# Patient Record
Sex: Female | Born: 1950
Health system: Southern US, Community
[De-identification: ages and names within clinical notes are randomized; demographics above are authoritative.]

## PROBLEM LIST (undated history)

## (undated) DIAGNOSIS — E1140 Type 2 diabetes mellitus with diabetic neuropathy, unspecified: Secondary | ICD-10-CM

## (undated) DIAGNOSIS — E785 Hyperlipidemia, unspecified: Secondary | ICD-10-CM

## (undated) DIAGNOSIS — S060X9A Concussion with loss of consciousness of unspecified duration, initial encounter: Secondary | ICD-10-CM

## (undated) DIAGNOSIS — E1165 Type 2 diabetes mellitus with hyperglycemia: Secondary | ICD-10-CM

## (undated) DIAGNOSIS — I10 Essential (primary) hypertension: Secondary | ICD-10-CM

## (undated) DIAGNOSIS — M659 Synovitis and tenosynovitis, unspecified: Secondary | ICD-10-CM

## (undated) DIAGNOSIS — R0681 Apnea, not elsewhere classified: Secondary | ICD-10-CM

## (undated) DIAGNOSIS — K5790 Diverticulosis of intestine, part unspecified, without perforation or abscess without bleeding: Secondary | ICD-10-CM

## (undated) DIAGNOSIS — M1990 Unspecified osteoarthritis, unspecified site: Secondary | ICD-10-CM

## (undated) DIAGNOSIS — G4733 Obstructive sleep apnea (adult) (pediatric): Secondary | ICD-10-CM

## (undated) DIAGNOSIS — M5416 Radiculopathy, lumbar region: Secondary | ICD-10-CM

## (undated) DIAGNOSIS — S060XAA Concussion with loss of consciousness status unknown, initial encounter: Secondary | ICD-10-CM

## (undated) HISTORY — DX: Concussion with loss of consciousness of unspecified duration, initial encounter: S06.0X9A

## (undated) HISTORY — DX: Unspecified osteoarthritis, unspecified site: M19.90

## (undated) HISTORY — DX: Obstructive sleep apnea (adult) (pediatric): G47.33

## (undated) HISTORY — DX: Radiculopathy, lumbar region: M54.16

## (undated) HISTORY — DX: Diverticulosis of intestine, part unspecified, without perforation or abscess without bleeding: K57.90

## (undated) HISTORY — DX: Hyperlipidemia, unspecified: E78.5

## (undated) HISTORY — DX: Concussion with loss of consciousness status unknown, initial encounter: S06.0XAA

## (undated) HISTORY — DX: Essential (primary) hypertension: I10

## (undated) HISTORY — DX: Apnea, not elsewhere classified: R06.81

## (undated) HISTORY — DX: Type 2 diabetes mellitus with hyperglycemia: E11.65

## (undated) HISTORY — DX: Type 2 diabetes mellitus with diabetic neuropathy, unspecified: E11.40

## (undated) HISTORY — DX: Synovitis and tenosynovitis, unspecified: M65.9

---

## 1973-04-09 HISTORY — PX: OTHER SURGICAL HISTORY: SHX169

## 1974-04-09 HISTORY — PX: EXPLORATORY LAPAROTOMY: SUR591

## 1994-04-09 DIAGNOSIS — I10 Essential (primary) hypertension: Secondary | ICD-10-CM

## 1994-04-09 HISTORY — DX: Essential (primary) hypertension: I10

## 1998-04-09 DIAGNOSIS — E785 Hyperlipidemia, unspecified: Secondary | ICD-10-CM

## 1998-04-09 HISTORY — DX: Hyperlipidemia, unspecified: E78.5

## 2003-04-10 DIAGNOSIS — E1165 Type 2 diabetes mellitus with hyperglycemia: Secondary | ICD-10-CM

## 2003-04-10 DIAGNOSIS — IMO0002 Reserved for concepts with insufficient information to code with codable children: Secondary | ICD-10-CM

## 2003-04-10 HISTORY — DX: Reserved for concepts with insufficient information to code with codable children: IMO0002

## 2003-04-10 HISTORY — DX: Type 2 diabetes mellitus with hyperglycemia: E11.65

## 2004-04-09 HISTORY — PX: MINOR HEMORRHOIDECTOMY: SHX6238

## 2007-02-06 DIAGNOSIS — M48061 Spinal stenosis, lumbar region without neurogenic claudication: Secondary | ICD-10-CM | POA: Insufficient documentation

## 2009-07-08 DEATH — deceased

## 2011-12-17 DIAGNOSIS — M25579 Pain in unspecified ankle and joints of unspecified foot: Secondary | ICD-10-CM | POA: Insufficient documentation

## 2011-12-17 DIAGNOSIS — M24876 Other specific joint derangements of unspecified foot, not elsewhere classified: Secondary | ICD-10-CM | POA: Insufficient documentation

## 2011-12-17 DIAGNOSIS — M2140 Flat foot [pes planus] (acquired), unspecified foot: Secondary | ICD-10-CM

## 2011-12-17 HISTORY — DX: Flat foot (pes planus) (acquired), unspecified foot: M21.40

## 2012-12-23 DIAGNOSIS — S92309A Fracture of unspecified metatarsal bone(s), unspecified foot, initial encounter for closed fracture: Secondary | ICD-10-CM | POA: Insufficient documentation

## 2013-06-19 DIAGNOSIS — K5790 Diverticulosis of intestine, part unspecified, without perforation or abscess without bleeding: Secondary | ICD-10-CM

## 2013-06-19 HISTORY — DX: Diverticulosis of intestine, part unspecified, without perforation or abscess without bleeding: K57.90

## 2015-04-10 DIAGNOSIS — M659 Synovitis and tenosynovitis, unspecified: Secondary | ICD-10-CM

## 2015-04-10 DIAGNOSIS — M65979 Unspecified synovitis and tenosynovitis, unspecified ankle and foot: Secondary | ICD-10-CM

## 2015-04-10 DIAGNOSIS — M76822 Posterior tibial tendinitis, left leg: Secondary | ICD-10-CM

## 2015-04-10 DIAGNOSIS — E1140 Type 2 diabetes mellitus with diabetic neuropathy, unspecified: Secondary | ICD-10-CM

## 2015-04-10 HISTORY — DX: Posterior tibial tendinitis, left leg: M76.822

## 2015-04-10 HISTORY — DX: Synovitis and tenosynovitis, unspecified: M65.9

## 2015-04-10 HISTORY — DX: Type 2 diabetes mellitus with diabetic neuropathy, unspecified: E11.40

## 2015-04-10 HISTORY — DX: Unspecified synovitis and tenosynovitis, unspecified ankle and foot: M65.979

## 2016-01-12 ENCOUNTER — Encounter: Payer: Self-pay | Admitting: Family Medicine

## 2016-01-12 DIAGNOSIS — E1165 Type 2 diabetes mellitus with hyperglycemia: Secondary | ICD-10-CM | POA: Insufficient documentation

## 2016-01-12 DIAGNOSIS — G4733 Obstructive sleep apnea (adult) (pediatric): Secondary | ICD-10-CM | POA: Insufficient documentation

## 2016-01-12 DIAGNOSIS — E1169 Type 2 diabetes mellitus with other specified complication: Secondary | ICD-10-CM | POA: Insufficient documentation

## 2016-01-12 DIAGNOSIS — I10 Essential (primary) hypertension: Secondary | ICD-10-CM | POA: Insufficient documentation

## 2016-01-12 DIAGNOSIS — E0865 Diabetes mellitus due to underlying condition with hyperglycemia: Secondary | ICD-10-CM | POA: Insufficient documentation

## 2016-01-12 DIAGNOSIS — E1140 Type 2 diabetes mellitus with diabetic neuropathy, unspecified: Secondary | ICD-10-CM | POA: Insufficient documentation

## 2016-01-12 DIAGNOSIS — E785 Hyperlipidemia, unspecified: Secondary | ICD-10-CM | POA: Insufficient documentation

## 2016-01-12 DIAGNOSIS — E119 Type 2 diabetes mellitus without complications: Secondary | ICD-10-CM | POA: Insufficient documentation

## 2016-01-13 ENCOUNTER — Encounter: Payer: Self-pay | Admitting: Family Medicine

## 2016-01-13 ENCOUNTER — Ambulatory Visit (INDEPENDENT_AMBULATORY_CARE_PROVIDER_SITE_OTHER): Payer: BC Managed Care – PPO | Admitting: Family Medicine

## 2016-01-13 VITALS — BP 120/77 | HR 69 | Temp 97.9°F | Resp 20 | Ht 70.0 in | Wt 257.0 lb

## 2016-01-13 DIAGNOSIS — Z7689 Persons encountering health services in other specified circumstances: Secondary | ICD-10-CM

## 2016-01-13 DIAGNOSIS — Z6836 Body mass index (BMI) 36.0-36.9, adult: Secondary | ICD-10-CM | POA: Insufficient documentation

## 2016-01-13 DIAGNOSIS — I10 Essential (primary) hypertension: Secondary | ICD-10-CM

## 2016-01-13 DIAGNOSIS — E785 Hyperlipidemia, unspecified: Secondary | ICD-10-CM | POA: Diagnosis not present

## 2016-01-13 DIAGNOSIS — E1149 Type 2 diabetes mellitus with other diabetic neurological complication: Secondary | ICD-10-CM | POA: Diagnosis not present

## 2016-01-13 DIAGNOSIS — Z23 Encounter for immunization: Secondary | ICD-10-CM | POA: Diagnosis not present

## 2016-01-13 LAB — POCT GLYCOSYLATED HEMOGLOBIN (HGB A1C): HEMOGLOBIN A1C: 7.7

## 2016-01-13 MED ORDER — BLOOD GLUCOSE MONITOR KIT: PACK | 0 refills | Status: DC

## 2016-01-13 MED ORDER — GABAPENTIN 100 MG PO CAPS: 100.0000 mg | ORAL_CAPSULE | Freq: Two times a day (BID) | ORAL | 1 refills | Status: DC

## 2016-01-13 MED ORDER — ROSUVASTATIN CALCIUM 5 MG PO TABS: 5.0000 mg | ORAL_TABLET | ORAL | 3 refills | Status: DC

## 2016-01-13 MED ORDER — AMLODIPINE-VALSARTAN-HCTZ 10-320-25 MG PO TABS: 1.0000 | ORAL_TABLET | Freq: Every day | ORAL | 1 refills | Status: DC

## 2016-01-13 MED ORDER — INSULIN DETEMIR 100 UNIT/ML FLEXPEN: 35.0000 [IU] | PEN_INJECTOR | Freq: Two times a day (BID) | SUBCUTANEOUS | 5 refills | Status: DC

## 2016-01-13 MED ORDER — METFORMIN HCL 1000 MG PO TABS: 1000.0000 mg | ORAL_TABLET | Freq: Two times a day (BID) | ORAL | 1 refills | Status: DC

## 2016-01-13 NOTE — Progress Notes (Signed)
Patient ID: Julie Osborne, female  DOB: 02-Dec-1950, 65 y.o.   MRN: [id number] Patient Care Team    Relationship Specialty Notifications Start End  Ma Hillock, DO PCP - General Family Medicine  01/13/16     Subjective:  Julie Osborne is a 65 y.o.  female present for new patient establishment. All past medical history, surgical history, allergies, family history, immunizations, medications and social history were obtained and entered  in the electronic medical record today. All recent labs, ED visits and hospitalizations within the last year were reviewed.  Diabetes: Currently prescribed metformin 1000 mg BID and Levemir 35 units BID. She does admit to neuropathy and is prescribed gabapentin 100 mg BID. Last a1c 7.9, prior was 9.9. Fasting BG: 110-120.  She is not doing as well with diet and exercise since moving, but hopes to get back on track soon.  Eye exam: 07/2014--> pt encouraged to make appt. and have records sent to [place]. Her ophthalmologist is in [place]. She is aware she needs yearly exams.  Foot exam 07/2014 --> 01/13/2016 PNA series pt state completed, requesting records.  Flu shot: administered today Microalbumin: on ARB  HTN: pt reports compliance with hctz 25 mg, amlodipine 10 mg - valsartan 320 mg daily. She denies chest pain, shortness of breath of lower ext edema. She admits to not watching her diet and exercise as well as of lately and wants to get back into the habit. She is on an ARB and a statin.   Hyperlipidemia: pts last lipid level in 5/2017improved with statin. She has been intolorate of about every statin with flu like symptoms. She is prescribed Rosuvastatin 5 mg and takes "every once in awhile".   Health maintenance:  Colonoscopy: completed 2014, by normal, 10 year follow up.  Mammogram: completed:02/08/2015, birads 1, normal.  Cervical cancer screening: last pap: 3 years , normal. Needs scheduled. Immunizations: tdap unknown, Influenza administered today  (encouraged yearly), PNA series completed per pt, awaiting records.  zostavax completed Infectious disease screening: unknown DEXA: none  There is no immunization history for the selected administration types on file for this patient.   Past Medical History:  Diagnosis Date  . Diabetic neuropathy ([place]) 2017  . Hyperlipidemia 2000  . Hypertension, benign 1996  . Tenosynovitis of ankle   . Uncontrolled diabetes mellitus ([place]) 2005   Allergies  Allergen Reactions  . Statins     Lipitor,simvatatin, pravastatin.welchol cause myalgia   Past Surgical History:  Procedure Laterality Date  . EXPLORATORY LAPAROTOMY  1976   endometriosis  . MINOR HEMORRHOIDECTOMY  2006   Family History  Problem Relation Age of Onset  . COPD Mother   . Diabetes Mellitus II Mother   . Heart failure Mother   . Osteoarthritis Father   . Diabetes Sister   . Diabetes Brother   . Dementia Maternal Grandmother   . Heart disease Maternal Grandmother   . Heart disease Maternal Grandfather   . Stroke Maternal Grandfather   . Heart failure Maternal Aunt    Social History   Social History  . Marital status: Married    Spouse name: N/A  . Number of children: N/A  . Years of education: 79   Occupational History  . Pharmacist, hospital   . retired Marine scientist    Social History Main Topics  . Smoking status: Former Smoker    Types: Cigarettes  . Smokeless tobacco: Never Used  . Alcohol use No  . Drug use: No  . Sexual  activity: Yes    Partners: Male    Birth control/ protection: None     Comment: Married   Other Topics Concern  . Not on file   Social History Narrative   Married to [employer], 2 children.    BS, RN retired now Printmaker.    Drinks caffeine, uses herbal remedies. Daily vitmain use.   Wears her seatbelt, smoke detector in the home.    exercises routinely.    She feels safe in her relationships.      Medication List       Accurate as of 01/13/16 12:27 PM. Always use your most recent med list.            albuterol 108 (90 Base) MCG/ACT inhaler Commonly known as:  PROVENTIL HFA;VENTOLIN HFA Inhale into the lungs every 6 (six) hours as needed for wheezing or shortness of breath.   Amlodipine-Valsartan-HCTZ 10-320-25 MG Tabs Take 1 tablet by mouth daily.   blood glucose meter kit and supplies Kit Dispense based on patient and insurance preference. Blood glucose x four times daily as directed. DDX:  ICD-9 250.00, 250.01.   docusate sodium 100 MG capsule Commonly known as:  COLACE Take 100 mg by mouth 2 (two) times daily.   FIBER-CAPS PO Take by mouth.   gabapentin 100 MG capsule Commonly known as:  NEURONTIN Take 1 capsule (100 mg total) by mouth 2 (two) times daily.   Insulin Detemir 100 UNIT/ML Pen Commonly known as:  LEVEMIR FLEXPEN Inject 35 Units into the skin 2 (two) times daily.   metFORMIN 1000 MG tablet Commonly known as:  GLUCOPHAGE Take 1 tablet (1,000 mg total) by mouth 2 (two) times daily with a meal.   montelukast 10 MG tablet Commonly known as:  SINGULAIR Take 10 mg by mouth at bedtime.   multivitamin capsule Take 1 capsule by mouth daily.   OSTEO BI-FLEX JOINT SHIELD PO Take 1 capsule by mouth daily.   rosuvastatin 5 MG tablet Commonly known as:  CRESTOR Take 1 tablet (5 mg total) by mouth every other day.   tiZANidine 4 MG tablet Commonly known as:  ZANAFLEX Take 4 mg by mouth every 6 (six) hours as needed for muscle spasms.        No results found for this or any previous visit (from the past 2160 hour(s)).  Patient was never admitted.   ROS: 14 pt review of systems performed and negative (unless mentioned in an HPI)  Objective: BP 120/77 (BP Location: Right Arm, Patient Position: Sitting, Cuff Size: Large)   Pulse 69   Temp 97.9 F (36.6 C)   Resp 20   Ht _0  (1.778 m)   Wt 257 lb (116.6 kg)   SpO2 96%   BMI 36.88 kg/m  Gen: Afebrile. No acute distress. Nontoxic in appearance, well-developed, well-nourished,  Obese,  caucasian female. Very pleasant.  HENT: AT. [place]. MMM, no oral lesions Eyes:Pupils Equal Round Reactive to light, Extraocular movements intact,  Conjunctiva without redness, discharge or icterus. Neck/lymp/endocrine: Supple,no lymphadenopathy, no thyromegaly CV: RRR no murmur, no edema, diminished but equal pulses bilaterally.  Chest: CTAB, no wheeze, rhonchi or crackles. normal Respiratory effort. good Air movement. Abd: Soft. Obese . NTND. BS present. no Masses palpated. No hepatosplenomegaly. No rebound tenderness or guarding. Skin: no rashes, purpura or petechiae. Warm and well-perfused. Skin intact. Right ankle with compression sleeve in place. Varicosities right LE Neuro/Msk: Normal gait. PERLA. EOMi. Alert. Oriented x3.   Psych: Normal affect, dress and demeanor.  Normal speech. Normal thought content and judgment. Diabetic Foot Exam - Simple   Simple Foot Form Diabetic Foot exam was performed with the following findings:  Yes 01/13/2016 10:40 AM  Visual Inspection No deformities, no ulcerations, no other skin breakdown bilaterally:  Yes Sensation Testing See comments:  Yes Pulse Check See comments:  Yes Comments diminished but equal bilateral PT. varicosities bilateral LE. neuropathy present.      Assessment/plan: Jezlyn Westerfield is a 65 y.o. female present for establishment of care in need of medication refills for chronic medical conditions.  Other diabetic neurological complication associated with type 2 diabetes mellitus (HCC)/ BMI 36.0-36.9,adult Eye exam: 07/2014--> pt encouraged to make appt. and have records sent to [place]. Her ophthalmologist is in [place]. She is aware she needs yearly exams.  Foot exam 07/2014 --> 01/13/2016 PNA series pt state completed, requesting records.  Flu shot: administered today Microalbumin: on ARB - Continue current regimen, a1c today 7.7. Discussed diet and exercise modifications.  - Continue levemir 35 u BID. Pt was cutting back on dose on  occassions. Discuused goal of 100 fasting glucose. She may increase by 1 u daily until seeing 100 BG, as long as not having hypoglycemia. Insulin Detemir (LEVEMIR FLEXPEN) 100 UNIT/ML Pen; Inject 35 Units into the skin 2 (two) times daily.  Dispense: 15 mL; Refill: 5 - gabapentin (NEURONTIN) 100 MG capsule; Take 1 capsule (100 mg total) by mouth 2 (two) times daily.  Dispense: 180 capsule; Refill: 1 - metFORMIN (GLUCOPHAGE) 1000 MG tablet; Take 1 tablet (1,000 mg total) by mouth 2 (two) times daily with a meal.  Dispense: 180 tablet; Refill: 1 - POCT glycosylated hemoglobin (Hb A1C) - blood glucose meter kit and supplies KIT; Dispense based on patient and insurance preference. Blood glucose x four times daily as directed. DDX:  ICD-9 250.00, 250.01.  Dispense: 1 each; Refill: 0  Hypertension, benign - stable, refills provided today - Amlodipine-Valsartan-HCTZ 10-320-25 MG TABS; Take 1 tablet by mouth daily.  Dispense: 180 tablet; Refill: 1 - low salt diet.  - F/U q6 mos  Hyperlipidemia, unspecified hyperlipidemia type - discussed with pt with her medical history and FHX she should try to take a statin for CV protection. If she can tolerate, QOD would at least give some benefits.  - rosuvastatin (CRESTOR) 5 MG tablet; Take 1 tablet (5 mg total) by mouth every other day.  Dispense: 45 tablet; Refill: 3  Need for prophylactic vaccination and inoculation against influenza - Flu Vaccine QUAD 36+ mos PF IM (Fluarix & Fluzone Quad PF)   Return in about 3 months (around 04/14/2016), or diabetes/HTN. with a1c before rooming  Greater than 45 minutes was spent with patient, greater than 50% of that time was spent face-to-face with patient counseling and coordinating care.  Electronically signed by: [name], DO [place]

## 2016-01-13 NOTE — Patient Instructions (Signed)
Pleasure to meet you.  I have refilled your medications.  Follow up in 3 months.  Check fasting blood glucose sugars, we will call in a monitor for you.  Take levemir as directed, if fasting sugars remain higher than 100 add 1 unit daily until you reach ~100. Diet and exercise of course will help.  I will need records for your other conditions in order to evaluate you and send referrals.  Make sure to make an eye appt.

## 2016-01-23 ENCOUNTER — Other Ambulatory Visit: Payer: Self-pay | Admitting: *Deleted

## 2016-01-23 MED ORDER — GLUCOSE BLOOD VI STRP: ORAL_STRIP | 12 refills | Status: DC

## 2016-01-23 MED ORDER — ONETOUCH DELICA LANCETS 33G MISC: 1.0000 | Freq: Three times a day (TID) | 11 refills | Status: DC

## 2016-01-23 NOTE — Telephone Encounter (Signed)
Glucose strips and lancets refilled

## 2016-01-24 ENCOUNTER — Ambulatory Visit (INDEPENDENT_AMBULATORY_CARE_PROVIDER_SITE_OTHER): Payer: BC Managed Care – PPO | Admitting: Family Medicine

## 2016-01-24 ENCOUNTER — Encounter: Payer: Self-pay | Admitting: Family Medicine

## 2016-01-24 VITALS — BP 134/84 | HR 81 | Temp 97.9°F | Resp 20 | Wt 259.5 lb

## 2016-01-24 DIAGNOSIS — R300 Dysuria: Secondary | ICD-10-CM

## 2016-01-24 LAB — POC URINALSYSI DIPSTICK (AUTOMATED)
BILIRUBIN UA: NEGATIVE
GLUCOSE UA: NEGATIVE
Ketones, UA: NEGATIVE
Nitrite, UA: POSITIVE
Protein, UA: NEGATIVE
RBC UA: NEGATIVE
SPEC GRAV UA: 1.015
Urobilinogen, UA: 0.2
pH, UA: 7

## 2016-01-24 MED ORDER — CEPHALEXIN 500 MG PO CAPS: 500.0000 mg | ORAL_CAPSULE | Freq: Three times a day (TID) | ORAL | 0 refills | Status: DC

## 2016-01-24 NOTE — Progress Notes (Signed)
Julie Osborne , 05-22-1950, 65 y.o., female MRN: [id number] Patient Care Team    Relationship Specialty Notifications Start End  Ma Hillock, DO PCP - General Family Medicine  01/13/16     CC: dysuria Subjective: Pt presents for an acute OV with complaints of dysuria of 4 days duration.  Associated symptoms include urinary urgency and  bladder spasms. Pt denies fever, chills, low back pain, abd pain. She is eating and drinking well.  Pt has tried AZO to ease their symptoms. She states she had a UTI about a year ago with similar symptoms.   Allergies  Allergen Reactions  . Statins     Lipitor,simvatatin, pravastatin.welchol cause myalgia   Social History  Substance Use Topics  . Smoking status: Former Smoker    Types: Cigarettes  . Smokeless tobacco: Never Used  . Alcohol use No   Past Medical History:  Diagnosis Date  . Degenerative arthritis   . Diabetic neuropathy ([place]) 2017  . Hyperlipidemia 2000  . Hypertension, benign 1996  . Tenosynovitis of ankle 2017   post tibial tendon dysfunction stage 5 (PTTD)  . Uncontrolled diabetes mellitus ([place]) 2005   Past Surgical History:  Procedure Laterality Date  . EXPLORATORY LAPAROTOMY  1976   endometriosis  . MINOR HEMORRHOIDECTOMY  2006  . OTHER SURGICAL HISTORY Right 1975   venous ligation   Family History  Problem Relation Age of Onset  . COPD Mother   . Diabetes Mellitus II Mother   . Heart failure Mother   . Osteoarthritis Father   . Lung cancer Father 39  . Diabetes Sister   . Diabetes Brother   . Dementia Maternal Grandmother   . Heart disease Maternal Grandmother   . Heart disease Maternal Grandfather   . Stroke Maternal Grandfather   . Heart failure Maternal Aunt   . Lung cancer Paternal Grandfather   . Lung cancer Paternal Aunt      Medication List       Accurate as of 01/24/16  4:35 PM. Always use your most recent med list.          albuterol 108 (90 Base) MCG/ACT inhaler Commonly known as:   PROVENTIL HFA;VENTOLIN HFA Inhale into the lungs every 6 (six) hours as needed for wheezing or shortness of breath.   Amlodipine-Valsartan-HCTZ 10-320-25 MG Tabs Take 1 tablet by mouth daily.   blood glucose meter kit and supplies Kit Dispense based on patient and insurance preference. Blood glucose x four times daily as directed. DDX:  ICD-9 250.00, 250.01.   docusate sodium 100 MG capsule Commonly known as:  COLACE Take 100 mg by mouth 2 (two) times daily.   FIBER-CAPS PO Take by mouth.   gabapentin 100 MG capsule Commonly known as:  NEURONTIN Take 1 capsule (100 mg total) by mouth 2 (two) times daily.   glucose blood test strip Commonly known as:  ONETOUCH VERIO Test blood sugar before meals and at bedtime   Insulin Detemir 100 UNIT/ML Pen Commonly known as:  LEVEMIR FLEXPEN Inject 35 Units into the skin 2 (two) times daily.   metFORMIN 1000 MG tablet Commonly known as:  GLUCOPHAGE Take 1 tablet (1,000 mg total) by mouth 2 (two) times daily with a meal.   montelukast 10 MG tablet Commonly known as:  SINGULAIR Take 10 mg by mouth at bedtime.   multivitamin capsule Take 1 capsule by mouth daily.   ONETOUCH DELICA LANCETS 14E Misc 1 applicator by Does not apply route 4 (four)  times daily -  before meals and at bedtime.   OSTEO BI-FLEX JOINT SHIELD PO Take 1 capsule by mouth daily.   rosuvastatin 5 MG tablet Commonly known as:  CRESTOR Take 1 tablet (5 mg total) by mouth every other day.   tiZANidine 4 MG tablet Commonly known as:  ZANAFLEX Take 4 mg by mouth every 6 (six) hours as needed for muscle spasms.       Results for orders placed or performed in visit on 01/24/16 (from the past 24 hour(s))  POCT Urinalysis Dipstick (Automated)     Status: Abnormal   Collection Time: 01/24/16  4:33 PM  Result Value Ref Range   Color, UA yellow    Clarity, UA clear    Glucose, UA negative    Bilirubin, UA negative    Ketones, UA negative    Spec Grav, UA 1.015      Blood, UA negative    pH, UA 7.0    Protein, UA negative    Urobilinogen, UA 0.2    Nitrite, UA positive    Leukocytes, UA Trace (A) Negative   No results found.   ROS: Negative, with the exception of above mentioned in HPI  Objective:  BP 134/84 (BP Location: Right Arm, Patient Position: Sitting, Cuff Size: Large)   Pulse 81   Temp 97.9 F (36.6 C)   Resp 20   Wt 259 lb 8 oz (117.7 kg)   SpO2 97%   BMI 37.23 kg/m  Body mass index is 37.23 kg/m. Gen: Afebrile. No acute distress. Nontoxic in appearance, well developed, well nourished.  HENT: AT. [place].  MMM Eyes:Pupils Equal Round Reactive to light, Extraocular movements intact,  Conjunctiva without redness, discharge or icterus. CV: RRR  Abd: Soft. obese. NTND. BS present.  MSK: no cva tenderness.  Neuro:  Normal gait. PERLA. EOMi. Alert. Oriented x3   Assessment/Plan: Julie Osborne is a 65 y.o. female present for acute OV for  - Pt taking AZO, will send for urine culture.  - keflex, 500 mg TID  - continue AZO for just 2-3 more days if needed.  - F/U PRN > 25 minutes spent with patient, >50% of time spent face to face counseling patient and coordinating care.   electronically signed by:  [name], DO  [place]

## 2016-01-24 NOTE — Patient Instructions (Signed)
Start keflex every 8 hours for 7 days.  Plenty of rest and hydration. If you develop fever or chills, unable to tolerate medicine, please be seen immediately.  We will call you with the culture results once available.

## 2016-01-25 LAB — URINE CULTURE

## 2016-01-26 ENCOUNTER — Telehealth: Payer: Self-pay | Admitting: Family Medicine

## 2016-01-26 NOTE — Telephone Encounter (Signed)
Please call pt: - her urine culture did not show signs of a predominant bacteria If her symptoms are resolving she can continue the kelfex until completion. If she is still symptomatic we will need to consider other causes of her discomfort.

## 2016-01-26 NOTE — Telephone Encounter (Signed)
Left detailed message with results and instructions on patient voice mail per DPR 

## 2016-01-31 ENCOUNTER — Other Ambulatory Visit: Payer: Self-pay | Admitting: *Deleted

## 2016-01-31 MED ORDER — ONETOUCH DELICA LANCETS 33G MISC: 1.0000 | Freq: Three times a day (TID) | 3 refills | Status: DC

## 2016-01-31 MED ORDER — GLUCOSE BLOOD VI STRP: ORAL_STRIP | 3 refills | Status: DC

## 2016-02-01 ENCOUNTER — Telehealth: Payer: Self-pay | Admitting: Family Medicine

## 2016-02-01 ENCOUNTER — Encounter: Payer: Self-pay | Admitting: Family Medicine

## 2016-02-01 DIAGNOSIS — R809 Proteinuria, unspecified: Secondary | ICD-10-CM | POA: Insufficient documentation

## 2016-02-01 NOTE — Telephone Encounter (Signed)
Left a detailed message on patient voice mail she is to call back with information on requested referral. Will place referral after confirmation from patient.

## 2016-02-01 NOTE — Telephone Encounter (Signed)
Please call pt: - She had asked for a podiatry/ortho referral for her ankle on her new pt establishment visit and I had suggested we wait for prior records before referring her since this was a prior treated condition.  I have received some records, but nothing detailed on that particular condition. I have a copy of an MRI 2015 of her lumbar spine and EMG study 2015 of her legs.  - If she could reach out to her prior specialist for records we may get more detailed information on her management. If she feels she would rather have the referral for ankle ankle now, I can place that referral for her to either podiatry or orthopedics or can wait on records, her decision on how/when to proceed and with which one.  Please advise in which specialist she would rather or if she wants to wait and try to get more records.

## 2016-02-02 NOTE — Telephone Encounter (Signed)
Patient called back and left message stating she will get records required, She states she would like to wait until January before getting referral due to insurance change. She will decide between ortho and podiatry once you have a chance to review records and make recommendation. Dr Raoul Pitch aware.

## 2016-03-22 ENCOUNTER — Telehealth: Payer: Self-pay | Admitting: Family Medicine

## 2016-03-22 NOTE — Telephone Encounter (Signed)
Patient calling to request refill of Insulin Detemir (LEVEMIR FLEXPEN) 100 UNIT/ML Pen.  States she was infomed by pcp to adjust medication according to blood sugar readings.  Pharmacy:   [id number] [address], [place] (Phone) [phone] (Fax)

## 2016-03-22 NOTE — Telephone Encounter (Signed)
Called patient pharmacy they state patient can get refill in the am left message on patient voice mail.

## 2016-03-26 ENCOUNTER — Telehealth: Payer: Self-pay | Admitting: Family Medicine

## 2016-03-26 ENCOUNTER — Ambulatory Visit (INDEPENDENT_AMBULATORY_CARE_PROVIDER_SITE_OTHER): Payer: BC Managed Care – PPO | Admitting: Family Medicine

## 2016-03-26 ENCOUNTER — Encounter: Payer: Self-pay | Admitting: Family Medicine

## 2016-03-26 VITALS — BP 124/78 | HR 85 | Temp 98.1°F | Resp 20 | Wt 258.5 lb

## 2016-03-26 DIAGNOSIS — J0110 Acute frontal sinusitis, unspecified: Secondary | ICD-10-CM

## 2016-03-26 MED ORDER — AMOXICILLIN-POT CLAVULANATE 875-125 MG PO TABS: 1.0000 | ORAL_TABLET | Freq: Two times a day (BID) | ORAL | 0 refills | Status: DC

## 2016-03-26 NOTE — Telephone Encounter (Signed)
Dr. Kuneff pt.  

## 2016-03-26 NOTE — Progress Notes (Signed)
Julie Osborne , 10-Sep-1950, 65 y.o., female MRN: [id number] Patient Care Team    Relationship Specialty Notifications Start End  Ma Hillock, DO PCP - General Family Medicine  01/13/16     CC: congestion Subjective: Pt presents for an acute OV with complaints of congestion of 4 days duration.  Associated symptoms include lightheadedness, low grade fever, headache, nausea, scratchy throat and bilateral ear pressure. She denies vomit, abd pain or diarrhea. 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 She is traveling to [place] in 3 days for her xmas vacation.    Allergies  Allergen Reactions  . Statins     Lipitor,simvatatin, pravastatin.welchol cause myalgia   Social History  Substance Use Topics  . Smoking status: Former Smoker    Types: Cigarettes  . Smokeless tobacco: Never Used  . Alcohol use No   Past Medical History:  Diagnosis Date  . Apnea   . Degenerative arthritis   . Diabetic neuropathy ([place]) 2017  . Diverticulosis 06/19/2013   sigmoid colon on colonoscopy  . Hyperlipidemia 2000  . Hypertension, benign 1996  . Lumbar radiculopathy    Severe L4/L5 radiculopathy by old records.   . Tenosynovitis of ankle 2017   post tibial tendon dysfunction stage 5 (PTTD)  . Uncontrolled diabetes mellitus ([place]) 2005   Past Surgical History:  Procedure Laterality Date  . EXPLORATORY LAPAROTOMY  1976   endometriosis  . MINOR HEMORRHOIDECTOMY  2006  . OTHER SURGICAL HISTORY Right 1975   venous ligation   Family History  Problem Relation Age of Onset  . COPD Mother   . Diabetes Mellitus II Mother   . Heart failure Mother   . Osteoarthritis Father   . Lung cancer Father 47  . Diabetes Sister   . Diabetes Brother   . Dementia Maternal Grandmother   . Heart disease Maternal Grandmother   . Heart disease Maternal Grandfather   . Stroke Maternal Grandfather   . Heart failure Maternal Aunt   . Lung cancer Paternal Grandfather   . Lung cancer Paternal  Aunt    Allergies as of 03/26/2016      Reactions   Statins    Lipitor,simvatatin, pravastatin.welchol cause myalgia      Medication List       Accurate as of 03/26/16  4:01 PM. Always use your most recent med list.          albuterol 108 (90 Base) MCG/ACT inhaler Commonly known as:  PROVENTIL HFA;VENTOLIN HFA Inhale into the lungs every 6 (six) hours as needed for wheezing or shortness of breath.   Amlodipine-Valsartan-HCTZ 10-320-25 MG Tabs Take 1 tablet by mouth daily.   blood glucose meter kit and supplies Kit Dispense based on patient and insurance preference. Blood glucose x four times daily as directed. DDX:  ICD-9 250.00, 250.01.   docusate sodium 100 MG capsule Commonly known as:  COLACE Take 100 mg by mouth 2 (two) times daily.   FIBER-CAPS PO Take by mouth.   gabapentin 100 MG capsule Commonly known as:  NEURONTIN Take 1 capsule (100 mg total) by mouth 2 (two) times daily.   glucose blood test strip Commonly known as:  ONETOUCH VERIO Test blood sugar before meals and at bedtime   Insulin Detemir 100 UNIT/ML Pen Commonly known as:  LEVEMIR FLEXPEN Inject 35 Units into the skin 2 (two) times daily.   metFORMIN 1000 MG tablet Commonly known as:  GLUCOPHAGE Take 1 tablet (1,000 mg total) by mouth  2 (two) times daily with a meal.   montelukast 10 MG tablet Commonly known as:  SINGULAIR Take 10 mg by mouth at bedtime.   multivitamin capsule Take 1 capsule by mouth daily.   ONETOUCH DELICA LANCETS 16X Misc 1 applicator by Does not apply route 4 (four) times daily -  before meals and at bedtime.   OSTEO BI-FLEX JOINT SHIELD PO Take 1 capsule by mouth daily.   rosuvastatin 5 MG tablet Commonly known as:  CRESTOR Take 1 tablet (5 mg total) by mouth every other day.   tiZANidine 4 MG tablet Commonly known as:  ZANAFLEX Take 4 mg by mouth every 6 (six) hours as needed for muscle spasms.       No results found for this or any previous visit (from  the past 24 hour(s)). No results found.   ROS: Negative, with the exception of above mentioned in HPI   Objective:  BP 124/78 (BP Location: Left Arm, Patient Position: Sitting, Cuff Size: Large)   Pulse 85   Temp 98.1 F (36.7 C)   Resp 20   Wt 258 lb 8 oz (117.3 kg)   SpO2 97%   BMI 37.09 kg/m  Body mass index is 37.09 kg/m. Gen: Afebrile. No acute distress. Nontoxic in appearance, well developed, well nourished. Obese, pleasant caucasian female.  HENT: AT. [place]. Bilateral TM visualized left TM with air fluid level. MMM, no oral lesions. Bilateral nares with mild erythema and swelling. Throat without erythema or exudates. PND present. Frontal sinus TTP.  Eyes:Pupils Equal Round Reactive to light, Extraocular movements intact,  Conjunctiva without redness, discharge or icterus. Neck/lymp/endocrine: Supple,no lymphadenopathy CV: RRR  Chest: CTAB, no wheeze or crackles. Good air movement, normal resp effort.  Abd: Soft. NTND. BS present.  Neuro: Normal gait. PERLA. EOMi. Alert. Oriented x3   Assessment/Plan: Julie Osborne is a 65 y.o. female present for acute OV for  Acute frontal sinusitis, recurrence not specified augmentin BID for ten days printed, start wednesday if not improving. Flonase, mucinex Rest and hydrate.  Work excuse provided for today and tomorrow.  - f/u prn   electronically signed by:  [name], DO  [facility] [place]   '

## 2016-03-26 NOTE — Telephone Encounter (Signed)
Patient Name: Julie Osborne  DOB: 1950-12-25    Initial Comment Caller states she started having a sore mouth, sore throat, and headache.   Nurse Assessment  Nurse: Raphael Gibney, RN, [name] Date/Time (Eastern Time): 03/26/2016 2:01:45 PM  Confirm and document reason for call. If symptomatic, describe symptoms. ---Caller states she has a sore throat, headache, and sore mouth. Swallowing is painful. Fever broke around 3-4 am. Sore throat started on Saturday.  Does the patient have any new or worsening symptoms? ---Yes  Will a triage be completed? ---Yes  Related visit to physician within the last 2 weeks? ---No  Does the PT have any chronic conditions? (i.e. diabetes, asthma, etc.) ---Yes  List chronic conditions. ---diabetes  Is this a behavioral health or substance abuse call? ---No     Guidelines    Guideline Title Affirmed Question Affirmed Notes  Sore Throat Diabetes mellitus or weak immune system (e.g., HIV positive, cancer chemo, splenectomy, organ transplant)    Final Disposition User   See Physician within 24 Hours [place], RN, Vera    Comments  appt scheduled for 03/26/2016 at 3:30 pm with Dr. [name]   Referrals  REFERRED TO PCP OFFICE   Disagree/Comply: Comply

## 2016-03-26 NOTE — Telephone Encounter (Signed)
noted 

## 2016-03-26 NOTE — Patient Instructions (Addendum)
Augmentin BID for ten days printed, start wedneday if not improving.  Flonase, mucinex Rest and hydrate.  Advil/tylenol for headache.    Try getting a humidifier as well.

## 2016-05-14 ENCOUNTER — Telehealth: Payer: Self-pay | Admitting: Family Medicine

## 2016-05-14 NOTE — Telephone Encounter (Signed)
Patient requesting to increase dosage of gabapentin (NEURONTIN) 100 MG capsule due to pain she is experiencing.  Pharmacy: [facility].

## 2016-05-14 NOTE — Telephone Encounter (Signed)
Left message on voicemail for patient to return call. 

## 2016-05-14 NOTE — Telephone Encounter (Signed)
Pt is overdue for Diabetes appt. She is to be seen every 3 months with insulin dependent diabetes Increase in "pain" could be from her diabetes worsening.  Of course we can increase her gabapentin to help her if it is neuropathy pain, but that should be discussed at her diabetes appt.  - she can increase her gabapentin to 200 mg BID, and make her appt for diabetes follow up.

## 2016-05-14 NOTE — Telephone Encounter (Signed)
Patient calling back for status of request.  Unable to transfer back line number at [facility].  Instruction from PCP given to patient, appt scheduled.

## 2016-05-17 ENCOUNTER — Other Ambulatory Visit: Payer: Self-pay | Admitting: Family Medicine

## 2016-05-17 DIAGNOSIS — E1149 Type 2 diabetes mellitus with other diabetic neurological complication: Secondary | ICD-10-CM

## 2016-05-22 ENCOUNTER — Ambulatory Visit: Payer: BC Managed Care – PPO | Admitting: Family Medicine

## 2016-05-23 ENCOUNTER — Ambulatory Visit (INDEPENDENT_AMBULATORY_CARE_PROVIDER_SITE_OTHER): Payer: BC Managed Care – PPO | Admitting: Family Medicine

## 2016-05-23 ENCOUNTER — Encounter: Payer: Self-pay | Admitting: Family Medicine

## 2016-05-23 VITALS — BP 154/80 | HR 70 | Temp 97.7°F | Resp 20 | Ht 70.0 in | Wt 261.5 lb

## 2016-05-23 DIAGNOSIS — Z6836 Body mass index (BMI) 36.0-36.9, adult: Secondary | ICD-10-CM | POA: Diagnosis not present

## 2016-05-23 DIAGNOSIS — E1169 Type 2 diabetes mellitus with other specified complication: Secondary | ICD-10-CM

## 2016-05-23 DIAGNOSIS — E1142 Type 2 diabetes mellitus with diabetic polyneuropathy: Secondary | ICD-10-CM

## 2016-05-23 DIAGNOSIS — M2142 Flat foot [pes planus] (acquired), left foot: Secondary | ICD-10-CM

## 2016-05-23 DIAGNOSIS — IMO0002 Reserved for concepts with insufficient information to code with codable children: Secondary | ICD-10-CM

## 2016-05-23 DIAGNOSIS — E785 Hyperlipidemia, unspecified: Secondary | ICD-10-CM

## 2016-05-23 DIAGNOSIS — M76822 Posterior tibial tendinitis, left leg: Secondary | ICD-10-CM

## 2016-05-23 DIAGNOSIS — I10 Essential (primary) hypertension: Secondary | ICD-10-CM

## 2016-05-23 DIAGNOSIS — Z794 Long term (current) use of insulin: Secondary | ICD-10-CM

## 2016-05-23 DIAGNOSIS — E1149 Type 2 diabetes mellitus with other diabetic neurological complication: Secondary | ICD-10-CM

## 2016-05-23 DIAGNOSIS — E1165 Type 2 diabetes mellitus with hyperglycemia: Secondary | ICD-10-CM

## 2016-05-23 LAB — POCT GLYCOSYLATED HEMOGLOBIN (HGB A1C): Hemoglobin A1C: 7.9

## 2016-05-23 MED ORDER — GABAPENTIN 100 MG PO CAPS: ORAL_CAPSULE | ORAL | 11 refills | Status: DC

## 2016-05-23 MED ORDER — AMLODIPINE-VALSARTAN-HCTZ 10-320-25 MG PO TABS
1.0000 | ORAL_TABLET | Freq: Every day | ORAL | 1 refills | Status: DC
Start: 2016-05-23 — End: 2017-03-04

## 2016-05-23 MED ORDER — INSULIN DETEMIR 100 UNIT/ML FLEXPEN: 35.0000 [IU] | PEN_INJECTOR | Freq: Two times a day (BID) | SUBCUTANEOUS | 0 refills | Status: DC

## 2016-05-23 MED ORDER — METFORMIN HCL 1000 MG PO TABS: 1000.0000 mg | ORAL_TABLET | Freq: Two times a day (BID) | ORAL | 1 refills | Status: DC

## 2016-05-23 MED ORDER — INSULIN DETEMIR 100 UNIT/ML FLEXPEN: 45.0000 [IU] | PEN_INJECTOR | Freq: Two times a day (BID) | SUBCUTANEOUS | 11 refills | Status: DC

## 2016-05-23 NOTE — Progress Notes (Signed)
Patient ID: Julie Osborne, female  DOB: [birth year], 66 y.o.   MRN: [id number] Patient Care Team    Relationship Specialty Notifications Start End  Ma Hillock, DO PCP - General Family Medicine  01/13/16     Subjective:  Julie Osborne is a 66 y.o.  female present for diabetes management  Diabetes:  Pt reports compliance with metformin 1000 mg BID and levemir 40 mg BID. Denies  hypo/hyperglycemic events or non-healing wounds. Pt reports BG ranges 150-170. She doe shave neuropathy of toes and hands on occasions. She has increased her gabapentin dose to 200 mg QD, and still having symptoms.  Onset: 2005 PNA series: she reports having completed series.  Flu shot: UTD 2017(recommneded yearly) BMP: normal, follow yearly Foot exam: 01/2016 Eye exam: 07/2014, in [place]. A1c: 7.9 today, about the same as prior.  Microallb: On ARB  HTN: Pt reports compliance with  amlodopine-valsartan-hctz 10-320-25. Blood pressures ranges at home normal. Patient denies chest pain, shortness of breath or lower extremity edema. Pt is  prescribed statin (low dose only tolerable).  Tenosynovitis of left ankle: Pt reports h/o left sided tenosynovitis for a few years in which she had seen orthopedics. She was not happy with orthopedics and never felt she got great resolution. We have never received full records from ortho. She requesting referral today. She would like to consider SM or podiatry.    Immunization History  Administered Date(s) Administered  . Influenza,inj,Quad PF,36+ Mos 01/13/2016  . Zoster 04/09/2012     Past Medical History:  Diagnosis Date  . Apnea   . Degenerative arthritis   . Diabetic neuropathy ([place]) 2017  . Diverticulosis 06/19/2013   sigmoid colon on colonoscopy  . Hyperlipidemia 2000  . Hypertension, benign 1996  . Lumbar radiculopathy    Severe L4/L5 radiculopathy by old records.   . Tenosynovitis of ankle 2017   post tibial tendon dysfunction stage 5 (PTTD)  .  Uncontrolled diabetes mellitus ([place]) 2005   Allergies  Allergen Reactions  . Statins     Lipitor,simvatatin, pravastatin.welchol cause myalgia   Past Surgical History:  Procedure Laterality Date  . EXPLORATORY LAPAROTOMY  1976   endometriosis  . MINOR HEMORRHOIDECTOMY  2006  . OTHER SURGICAL HISTORY Right 1975   venous ligation   Family History  Problem Relation Age of Onset  . COPD Mother   . Diabetes Mellitus II Mother   . Heart failure Mother   . Osteoarthritis Father   . Lung cancer Father 35  . Diabetes Sister   . Diabetes Brother   . Dementia Maternal Grandmother   . Heart disease Maternal Grandmother   . Heart disease Maternal Grandfather   . Stroke Maternal Grandfather   . Heart failure Maternal Aunt   . Lung cancer Paternal Grandfather   . Lung cancer Paternal Aunt    Social History   Social History  . Marital status: Married    Spouse name: Octavia Bruckner  . Number of children: 2  . Years of education: 16   Occupational History  . Pharmacist, hospital   . retired Marine scientist    Social History Main Topics  . Smoking status: Former Smoker    Types: Cigarettes  . Smokeless tobacco: Never Used  . Alcohol use No  . Drug use: No  . Sexual activity: Yes    Partners: Male    Birth control/ protection: None     Comment: Married   Other Topics Concern  . Not on file  Social History Narrative   Married to [employer], 2 children.    BS, RN retired now Printmaker.    Drinks caffeine, uses herbal remedies. Daily vitmain use.   Wears her seatbelt, smoke detector in the home.    exercises routinely.    She feels safe in her relationships.    Allergies as of 05/23/2016      Reactions   Statins    Lipitor,simvatatin, pravastatin.welchol cause myalgia      Medication List       Accurate as of 05/23/16  2:41 PM. Always use your most recent med list.          albuterol 108 (90 Base) MCG/ACT inhaler Commonly known as:  PROVENTIL HFA;VENTOLIN HFA Inhale into the lungs every 6 (six) hours  as needed for wheezing or shortness of breath.   Amlodipine-Valsartan-HCTZ 10-320-25 MG Tabs Take 1 tablet by mouth daily.   blood glucose meter kit and supplies Kit Dispense based on patient and insurance preference. Blood glucose x four times daily as directed. DDX:  ICD-9 250.00, 250.01.   docusate sodium 100 MG capsule Commonly known as:  COLACE Take 100 mg by mouth 2 (two) times daily.   FIBER-CAPS PO Take by mouth.   gabapentin 100 MG capsule Commonly known as:  NEURONTIN Take 1 capsule (100 mg total) by mouth 2 (two) times daily.   glucose blood test strip Commonly known as:  ONETOUCH VERIO Test blood sugar before meals and at bedtime   LEVEMIR FLEXTOUCH 100 UNIT/ML Pen Generic drug:  Insulin Detemir INJECT 35 UNITS INTO THE SKIN 2 (TWO) TIMES DAILY.   metFORMIN 1000 MG tablet Commonly known as:  GLUCOPHAGE Take 1 tablet (1,000 mg total) by mouth 2 (two) times daily with a meal.   montelukast 10 MG tablet Commonly known as:  SINGULAIR Take 10 mg by mouth at bedtime.   multivitamin capsule Take 1 capsule by mouth daily.   ONETOUCH DELICA LANCETS 01K Misc 1 applicator by Does not apply route 4 (four) times daily -  before meals and at bedtime.   OSTEO BI-FLEX JOINT SHIELD PO Take 1 capsule by mouth daily.   rosuvastatin 5 MG tablet Commonly known as:  CRESTOR Take 1 tablet (5 mg total) by mouth every other day.   tiZANidine 4 MG tablet Commonly known as:  ZANAFLEX Take 4 mg by mouth every 6 (six) hours as needed for muscle spasms.        Recent Results (from the past 2160 hour(s))  POCT glycosylated hemoglobin (Hb A1C)     Status: Abnormal   Collection Time: 05/23/16  8:14 AM  Result Value Ref Range   Hemoglobin A1C 7.9     Patient was never admitted.   ROS: 14 pt review of systems performed and negative (unless mentioned in an HPI)  Objective: BP (!) 154/80 (BP Location: Right Arm, Patient Position: Sitting, Cuff Size: Large)   Pulse 70    Temp 97.7 F (36.5 C)   Resp 20   Ht _0  (1.778 m)   Wt 261 lb 8 oz (118.6 kg)   SpO2 99%   BMI 37.52 kg/m  Gen: Afebrile. No acute distress. Nontoxic in appearance, well-developed, well-nourished,  Obese, caucasian female. Very pleasant.  HENT: AT. [place]. MMM, no oral lesions Eyes:Pupils Equal Round Reactive to light, Extraocular movements intact,  Conjunctiva without redness, discharge or icterus. Neck/lymp/endocrine: Supple,no lymphadenopathy, no thyromegaly CV: RRR no murmur, no edema, diminished but equal pulses bilaterally.  Chest: CTAB, no  wheeze, rhonchi or crackles. normal Respiratory effort. good Air movement. Abd: Soft. Obese . NTND. BS present. no Masses palpated. No hepatosplenomegaly. No rebound tenderness or guarding. Skin: no rashes, purpura or petechiae. Warm and well-perfused. Skin intact. Right ankle with compression sleeve in place. Varicosities right LE Neuro/Msk: Normal gait. PERLA. EOMi. Alert. Oriented x3.     Assessment/plan: Julie Osborne is a 66 y.o. female present for establishment of care in need of medication refills for chronic medical conditions.  Other diabetic neurological complication associated with type 2 diabetes mellitus (HCC)/ BMI 36.0-36.9,adult - stable, but still above goal.  - increase levemir by taper (start at 42 u BID), instructions provided by [facility].  - increase gabapentin 300 mg in the morning 400 mg at night. Pt does not desire to take TID.  - exercise counseling and PREP program referral placed.  PNA series: she reports having completed series. Pt to bring in report.  Flu shot: UTD 2017(recommneded yearly) BMP: normal, follow yearly Foot exam: 01/2016 Eye exam: 07/2014, in [place].Complete yearly.  A1c: 7.9 today, about the same as prior.  Microallb: On ARB  Hypertension, benign - stable. Continue current regimen.  - low salt, exercise encouraged.  - PREP program referral placed for exercise/diet counseling.  -  Amlodipine-Valsartan-HCTZ 10-320-25 MG TABS; Take 1 tablet by mouth daily.  Dispense: 180 tablet; Refill: 1 - F/U q3-6 mos  Hyperlipidemia, unspecified hyperlipidemia type - stable. Follow yearly. Taking low dose statin.   Tenosynovitis of left ankle/PTTD: - pt would like to see SM doctor. She made need orthotics. Reported as PTTD stage 5.  - I do not have complete prior records from ortho. Limited records scanned into system.  Return in about 3 months (around 08/20/2016) for CPE. Can cover DM at visit if stable only.   Electronically signed by: [name], DO [place]

## 2016-05-23 NOTE — Patient Instructions (Addendum)
Increase levemir to 42 u every 12 hours. Monitor fasting BG and if > 120 add 1 u to daily dose until you are seeing BG<120. Try prep program, and increase exercise. Watch your diet more closely. Try to make one change a week to lower carbs/sugar routinely.   Increase gabapentin to 300 mg morning and 400 mg before bed (night). Watch BP if routinely above 140/80 then we would need to discuss BP meds. Please obtain your vaccine record so we can update them.   F/U 3 months on diabetes    Diabetes Mellitus and Exercise Exercising regularly is important for your overall health, especially when you have diabetes (diabetes mellitus). Exercising is not only about losing weight. It has many health benefits, such as increasing muscle strength and bone density and reducing body fat and stress. This leads to improved fitness, flexibility, and endurance, all of which result in better overall health. Exercise has additional benefits for people with diabetes, including:  Reducing appetite.  Helping to lower and control blood glucose.  Lowering blood pressure.  Helping to control amounts of fatty substances (lipids) in the blood, such as cholesterol and triglycerides.  Helping the body to respond better to insulin (improving insulin sensitivity).  Reducing how much insulin the body needs.  Decreasing the risk for heart disease by:  Lowering cholesterol and triglyceride levels.  Increasing the levels of good cholesterol.  Lowering blood glucose levels. What is my activity plan? Your health care provider or certified diabetes educator can help you make a plan for the type and frequency of exercise (activity plan) that works for you. Make sure that you:  Do at least 150 minutes of moderate-intensity or vigorous-intensity exercise each week. This could be brisk walking, biking, or water aerobics.  Do stretching and strength exercises, such as yoga or weightlifting, at least 2 times a week.  Spread  out your activity over at least 3 days of the week.  Get some form of physical activity every day.  Do not go more than 2 days in a row without some kind of physical activity.  Avoid being inactive for more than 90 minutes at a time. Take frequent breaks to walk or stretch.  Choose a type of exercise or activity that you enjoy, and set realistic goals.  Start slowly, and gradually increase the intensity of your exercise over time. What do I need to know about managing my diabetes?  Check your blood glucose before and after exercising.  If your blood glucose is higher than 240 mg/dL (13.3 mmol/L) before you exercise, check your urine for ketones. If you have ketones in your urine, do not exercise until your blood glucose returns to normal.  Know the symptoms of low blood glucose (hypoglycemia) and how to treat it. Your risk for hypoglycemia increases during and after exercise. Common symptoms of hypoglycemia can include:  Hunger.  Anxiety.  Sweating and feeling clammy.  Confusion.  Dizziness or feeling light-headed.  Increased heart rate or palpitations.  Blurry vision.  Tingling or numbness around the mouth, lips, or tongue.  Tremors or shakes.  Irritability.  Keep a rapid-acting carbohydrate snack available before, during, and after exercise to help prevent or treat hypoglycemia.  Avoid injecting insulin into areas of the body that are going to be exercised. For example, avoid injecting insulin into:  The arms, when playing tennis.  The legs, when jogging.  Keep records of your exercise habits. Doing this can help you and your health care provider adjust  your diabetes management plan as needed. Write down:  Food that you eat before and after you exercise.  Blood glucose levels before and after you exercise.  The type and amount of exercise you have done.  When your insulin is expected to peak, if you use insulin. Avoid exercising at times when your insulin is  peaking.  When you start a new exercise or activity, work with your health care provider to make sure the activity is safe for you, and to adjust your insulin, medicines, or food intake as needed.  Drink plenty of water while you exercise to prevent dehydration or heat stroke. Drink enough fluid to keep your urine clear or pale yellow. This information is not intended to replace advice given to you by your health care provider. Make sure you discuss any questions you have with your health care provider. Document Released: 06/16/2003 Document Revised: 10/14/2015 Document Reviewed: 09/05/2015 Elsevier Interactive Patient Education  2017 [employer].

## 2016-05-25 ENCOUNTER — Telehealth: Payer: Self-pay

## 2016-05-25 NOTE — Telephone Encounter (Signed)
Received referral for Julie Osborne for the PREP at [facility].  Left her a voicemail to call back.

## 2016-05-30 ENCOUNTER — Ambulatory Visit (INDEPENDENT_AMBULATORY_CARE_PROVIDER_SITE_OTHER): Payer: BC Managed Care – PPO | Admitting: Family Medicine

## 2016-05-30 ENCOUNTER — Institutional Professional Consult (permissible substitution): Payer: BC Managed Care – PPO | Admitting: Sports Medicine

## 2016-05-30 ENCOUNTER — Ambulatory Visit (INDEPENDENT_AMBULATORY_CARE_PROVIDER_SITE_OTHER): Payer: BC Managed Care – PPO

## 2016-05-30 ENCOUNTER — Encounter: Payer: Self-pay | Admitting: Family Medicine

## 2016-05-30 DIAGNOSIS — S9305XA Dislocation of left ankle joint, initial encounter: Secondary | ICD-10-CM

## 2016-05-30 DIAGNOSIS — M85872 Other specified disorders of bone density and structure, left ankle and foot: Secondary | ICD-10-CM

## 2016-05-30 DIAGNOSIS — M79672 Pain in left foot: Secondary | ICD-10-CM | POA: Diagnosis not present

## 2016-05-30 DIAGNOSIS — S9302XA Subluxation of left ankle joint, initial encounter: Secondary | ICD-10-CM | POA: Insufficient documentation

## 2016-05-30 MED ORDER — DICLOFENAC SODIUM 1 % TD GEL: 4.0000 g | Freq: Four times a day (QID) | TRANSDERMAL | 11 refills | Status: DC

## 2016-05-30 NOTE — Patient Instructions (Addendum)
Thank you for coming in today. Please schedule for orthotics in the near future.  Return in 4-6 weeks.  Make sure to bring a variety of shoes including sneakers to your visit.  Bring capri pants or a dress/skirt or shorts.   Use the voltaren gel 4x daily.

## 2016-05-30 NOTE — Progress Notes (Signed)
Subjective:    I'm seeing this patient as a consultation for:  Julie Pouch, DO   CC: Left ankle pronation and lateral foot pain  HPI: Ms Julie Osborne Has a long complicated history of left ankle and foot pain. In 2005 she noted medial ankle pain along with pronation.  She's been seen by several different orthopedists and podiatrist and has had a trial of what sounds like an ankle foot orthosis cast boot rigid orthotics and various other treatments. Reasonable helped marginally at times. She notes the rigid orthotics are very uncomfortable. She notes significant ankle pronation and subluxation medially but denies any pain in this area. She notes the majority of her pain is at the lateral midfoot. She notes his pain is worse with activity and better with rest. Her medical history is complicated by type 2 diabetes. She currently wears crocs as she finds them to be the most comfortable shoes she owns. Her goals are to be able to walk 1 mile without having significant pain.  Past medical history, Surgical history, Family history not pertinant except as noted below, Social history, Allergies, and medications have been entered into the medical record, reviewed, and no changes needed.   Review of Systems: No headache, visual changes, nausea, vomiting, diarrhea, constipation, dizziness, abdominal pain, skin rash, fevers, chills, night sweats, weight loss, swollen lymph nodes, body aches, joint swelling, muscle aches, chest pain, shortness of breath, mood changes, visual or auditory hallucinations.   Objective:    Vitals:   05/30/16 0943  BP: (!) 144/64  Pulse: 79   General: Well Developed, well nourished, and in no acute distress.  Neuro/Psych: Alert and oriented x3, extra-ocular muscles intact, able to move all 4 extremities, sensation grossly intact. Skin: Warm and dry, no rashes noted.  Respiratory: Not using accessory muscles, speaking in full sentences, trachea midline.  Cardiovascular: Pulses  palpable, no extremity edema. Abdomen: Does not appear distended. MSK: Feet and ankle bilaterally have significant ankle subluxation pronation and pes planus. The left ankle is worst in the right with ankle subluxation. With standing the navicular prominence is almost on the ground but there is no callous formed in this area. She is nontender along the medial foot and ankle. She points to her lateral mid foot near the cuboid as the majority of her pain. Tender in this area. She has normal ankle dorsiflexion and plantarflexion. Pulses are intact distally.  No results found for this or any previous visit (from the past 24 hour(s)). Dg Ankle Complete Left  Result Date: 05/30/2016 CLINICAL DATA:  Sporadic pain.  No injury. EXAM: LEFT ANKLE COMPLETE - 3+ VIEW COMPARISON:  No recent prior . FINDINGS: Diffuse soft tissue swelling. Prominent degenerative changes noted about the ankle and foot. Flattening of the foot noted. Slight deformity noted the distal tip of the lateral malleolus, this is possibly a fracture, possibly old. Clinical correlation suggested. Small bony densities noted adjacent to the medial malleolus and navicula. These are most likely old fracture fragments. IMPRESSION: 1. Diffuse soft tissue swelling. Slight deformity noted the distal tip of the lateral malleolus, this is possibly a fracture, possibly old. Clinical correlation suggested. 2. Diffuse degenerative changes noted about the ankle and foot. Scratched Corticated small bony densities noted adjacent to the medial malleolus and navicula, most likely sites of old fracture or related to degenerative change. Developing Charcot's ankle/ foot cannot be excluded. Electronically Signed   By: [place]   On: 05/30/2016 10:52   Dg Foot Complete Left  Result Date: 05/30/2016 CLINICAL DATA:  Pain.  No injury. EXAM: LEFT FOOT - COMPLETE 3+ VIEW COMPARISON:  No recent prior. FINDINGS: Diffuse osteopenia. Diffuse degenerative change.  Degenerative changes most prominent about the ankle, hindfoot, and first metatarsophalangeal joint. First metatarsophalangeal joint. Foot is flattened and subluxed laterally Corticated bony densities noted about the navicula and the medial malleolus, most likely old fracture fragments. Slight deformity noted the distal tip of the lateral malleolus. Subtle fracture cannot be excluded, possibly old. Developing Charcot's foot cannot be excluded. IMPRESSION: 1. Severe diffuse degenerative change with flattening the foot with lateral subluxation. 2. Corticated bony densities noted adjacent to the navicula and medial malleolus, most likely old small fracture fragments. Slight deformity noted the distal tip of the fibula, subtle fracture cannot be excluded. This is possibly old. Correlation suggested .Developing Charcot's foot cannot be excluded . Electronically Signed   By: Marcello Moores  Register   On: 05/30/2016 10:50    Impression and Recommendations:    Assessment and Plan: 66 y.o. female with Foot and ankle deformity. Patient has significant ankle pronation and subtalar subluxation. This appears to be a developing Charcot foot.  However the majority of her pain is lateral near the joint between the cuboid and calcaneus. I believe this area is being compressed and has developed osteoarthritis.  History of the treatment this is going to be a bit challenging. I'm doubtful that any intervention will completely resolve her symptoms. Starting with a more conservative approach is reasonable. Plan for orthotics, diclofenac gel and compression area and additionally we'll refer to physical therapy for foot and ankle strengthening as well as hip abduction and core strengthening.  Recheck in 4-6 weeks.   Discussed warning signs or symptoms. Please see discharge instructions. Patient expresses understanding.  CC: Julie Pouch, DO

## 2016-05-31 ENCOUNTER — Ambulatory Visit (INDEPENDENT_AMBULATORY_CARE_PROVIDER_SITE_OTHER): Payer: BC Managed Care – PPO | Admitting: Family Medicine

## 2016-05-31 VITALS — BP 136/66

## 2016-05-31 DIAGNOSIS — M2142 Flat foot [pes planus] (acquired), left foot: Secondary | ICD-10-CM | POA: Diagnosis not present

## 2016-05-31 DIAGNOSIS — S9305XA Dislocation of left ankle joint, initial encounter: Secondary | ICD-10-CM

## 2016-05-31 DIAGNOSIS — S9302XA Subluxation of left ankle joint, initial encounter: Secondary | ICD-10-CM

## 2016-05-31 DIAGNOSIS — M76822 Posterior tibial tendinitis, left leg: Secondary | ICD-10-CM

## 2016-05-31 NOTE — Progress Notes (Signed)
    Orthotics Note:   Patient was fitted for a : standard, cushioned, semi-rigid orthotic. The orthotic was heated and afterward the patient stood on the orthotic blank positioned on the orthotic stand. The patient was positioned in subtalar neutral position and 10 degrees of ankle dorsiflexion in a weight bearing stance. After completion of molding, a stable base was applied to the orthotic blank. The blank was ground to a stable position for weight bearing. Size: 10 Base: [employer] and safety inspector and [place]adding: None The patient ambulated these, and they were very comfortable.  I spent 40 minutes with this patient, greater than 50% was face-to-face time counseling regarding the below diagnosis.  CC: [name], DO

## 2016-05-31 NOTE — Patient Instructions (Signed)
Thank you for coming in today. Recheck in a few weeks as previously discussed.  Let me know if the orthotics are not comfortable.

## 2016-06-07 ENCOUNTER — Encounter: Payer: Self-pay | Admitting: Rehabilitative and Restorative Service Providers"

## 2016-06-07 ENCOUNTER — Ambulatory Visit (INDEPENDENT_AMBULATORY_CARE_PROVIDER_SITE_OTHER): Payer: BC Managed Care – PPO | Admitting: Rehabilitative and Restorative Service Providers"

## 2016-06-07 DIAGNOSIS — M25572 Pain in left ankle and joints of left foot: Secondary | ICD-10-CM

## 2016-06-07 DIAGNOSIS — M6281 Muscle weakness (generalized): Secondary | ICD-10-CM | POA: Diagnosis not present

## 2016-06-07 DIAGNOSIS — R29898 Other symptoms and signs involving the musculoskeletal system: Secondary | ICD-10-CM | POA: Diagnosis not present

## 2016-06-07 NOTE — Therapy (Signed)
[place], [place], [address] Phone: [phone]   Fax:  [phone]  Physical Therapy Treatment  Patient Details  Name: Julie Osborne MRN: [id number] Date of Birth: [birth year] Referring Provider: Dr Lynne Leader   Encounter Date: 06/07/2016      PT End of Session - 06/07/16 1750    Visit Number 1   Number of Visits 6   Date for PT Re-Evaluation 07/19/16   PT Start Time [id number]   PT Stop Time 1805   PT Time Calculation (min) 53 min   Activity Tolerance Patient tolerated treatment well      Past Medical History:  Diagnosis Date  . Apnea   . Degenerative arthritis   . Diabetic neuropathy ([place]) 2017  . Diverticulosis 06/19/2013   sigmoid colon on colonoscopy  . Hyperlipidemia 2000  . Hypertension, benign 1996  . Lumbar radiculopathy    Severe L4/L5 radiculopathy by old records.   . Tenosynovitis of ankle 2017   post tibial tendon dysfunction stage 5 (PTTD)  . Uncontrolled diabetes mellitus ([place]) 2005    Past Surgical History:  Procedure Laterality Date  . EXPLORATORY LAPAROTOMY  1976   endometriosis  . MINOR HEMORRHOIDECTOMY  2006  . OTHER SURGICAL HISTORY Right 1975   venous ligation    There were no vitals filed for this visit.      Subjective Assessment - 06/07/16 1715    Subjective Patient reports that she began to have Lt foot problems over the past 11-12 years. She was treated for pain with inserts and changed shoes to improve support. She noted increased symptoms in 2014 when she was on her feet more - awoke with severe pain. She wsa diagnosed with PTTD Stage 5. She was placed in a walking shoe with no movement of the Lt foot and ankle which changed gait and created LBP which was treated with PT. She stopped wearing the brace after about a year. She has continued to have foot pain. Dr Georgina Snell has made inserts which have helped some.    Pertinent History AODM; peripherial neuropathy;  DJD spine hands foot and ankle Lt; HNT   How long can you sit comfortably? no limit   How long can you stand comfortably? 10 min   How long can you walk comfortably? 5 min slow pace    Diagnostic tests xrays    Patient Stated Goals increase leg strength; to increase endurace; learn exercises for foot and ankle    Currently in Pain? Yes   Pain Score 3    Pain Location Foot   Pain Orientation Left   Pain Descriptors / Indicators Dull  sharp shooting pain outside Lt LE    Pain Type Chronic pain   Pain Radiating Towards up the Lt lateral leg toward knee    Pain Onset More than a month ago   Pain Frequency Intermittent  related to activity    Aggravating Factors  prolonged standing or walking; steps    Pain Relieving Factors meds; voltaran; ice; massage            [facility] PT Assessment - 06/07/16 0001      Assessment   Medical Diagnosis Lt posterior tibial tendon dysfunction    Referring Provider Dr Lynne Leader    Onset Date/Surgical Date 02/22/16   Hand Dominance Right   Next MD Visit 4/18   Prior Therapy none for foot      Precautions   Precautions None  Balance Screen   Has the patient fallen in the past 6 months Yes   How many times? 1   Has the patient had a decrease in activity level because of a fear of falling?  No   Is the patient reluctant to leave their home because of a fear of falling?  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 walking much of the day    Leisure household chores; walks dog on 4 wheel scooter      Observation/Other Assessments   Focus on Therapeutic Outcomes (FOTO)  54% limitaiton      Sensation   Additional Comments prepherial neuropathy      Posture/Postural Control   Posture Comments head forward; shoulders rounded and elevated; increased thoracic kyphosis; hips flexed; knees  hyperextended; LE's in ER Lt > Rt; wt shifted to the Rt      AROM   Overall AROM Comments bilat hips WFL's some tightness hip extension    Right/Left Knee --  bilat knees WNL'S   Right Ankle Dorsiflexion 6   Right Ankle Plantar Flexion 41   Right Ankle Inversion 21   Right Ankle Eversion 8   Left Ankle Dorsiflexion 7   Left Ankle Plantar Flexion 47   Left Ankle Inversion 29   Left Ankle Eversion 18     Strength   Right Hip Flexion 5/5   Right Hip Extension 5/5   Right Hip ABduction 5/5   Left Hip Flexion --  5-/5   Left Hip Extension 4+/5   Left Hip ABduction 4/5   Right Knee Flexion 5/5   Right Knee Extension 5/5   Left Knee Flexion 5/5   Left Knee Extension 5/5   Right Ankle Dorsiflexion --  5-/5   Right Ankle Plantar Flexion 3/5   Right Ankle Inversion --  5-/5   Right Ankle Eversion 4+/5   Left Ankle Dorsiflexion 4-/5   Left Ankle Plantar Flexion 3/5   Left Ankle Inversion 4+/5   Left Ankle Eversion 4/5     Flexibility   Hamstrings tight Rt 85 deg; Lt 80 deg   Quadriceps tight Rt 95 deg; Lt 90 deg    ITB tight Lt    Piriformis tight Lt      Palpation   Palpation comment tender posterior tibalis into the lateral/dorsum of the foot      Functional Gait  Assessment   Gait assessed  --  abmulates with steppage gait; Lt > Rt LE in ER                      [facility] Adult PT Treatment/Exercise - 06/07/16 0001      Exercises   Exercises Knee/Hip     Knee/Hip Exercises: Stretches   Quad Stretch Left;2 reps;30 seconds   Piriformis Stretch Left;2 reps;30 seconds     Knee/Hip Exercises: Seated   Sit to [employer] 10 reps;without UE support     Knee/Hip Exercises: Supine   Bridges 1 set;10 reps     Modalities   Modalities Vasopneumatic     Vasopneumatic   Number Minutes Vasopneumatic  10 minutes   Vasopnuematic Location  Ankle  Lt   Vasopneumatic Pressure Low   Vasopneumatic Temperature  34 deg  PT Education - 06/07/16  1805    Education provided Yes   Education Details HEP    Person(s) Educated Patient   Methods Explanation;Demonstration;Tactile cues;Verbal cues;Handout   Comprehension Verbalized understanding;Returned demonstration;Verbal cues required;Tactile cues required             PT Long Term Goals - 06/07/16 1756      PT LONG TERM GOAL #1   Title Improve strength bilat LE's 1/2 muscle grade 07/19/16   Time 6   Period Weeks   Status New     PT LONG TERM GOAL #2   Title Improve functional activities including sit to stand 07/19/16   Time 6   Period Weeks   Status New     PT LONG TERM GOAL #3   Title Independent HEP 07/19/16   Time 6   Period Weeks   Status New     PT LONG TERM GOAL #4   Title Improve FOTO to </= 41% limitation 07/19/16   Time 6   Period Weeks   Status New               Plan - 06/07/16 1751    Clinical Impression Statement Patient presents with Lt posterior tibial tendon dysfunction which is longstanding in nature. She has pain over the course of the past 11-12 years. Patient has limited LE mobility; decreased strength; limited functional activities; limited standing and walking tolerance; abnormal gait pattern; pain on a daily basis which is incresad with the length of time she is on her feet. Patient has hs oc AODM and peripherial neuropathy which further complicates the foot and ankle problems. She is limited to PT one day a week due to scheduling and cost. Patient will benefit from PT to address problems identified.   Rehab Potential Good   PT Frequency 1x / week   PT Duration 6 weeks   PT Treatment/Interventions Patient/family education;ADLs/Self Care Home Management;Cryotherapy;Electrical Stimulation;Iontophoresis 4mg /ml Dexamethasone;Moist Heat;Ultrasound;Dry needling;Manual techniques;Therapeutic activities;Therapeutic exercise   PT Next Visit Plan stretching; strengthening; functional activities and gait training; manual work and modalities as  indicated; taping as indicated    Consulted and Agree with Plan of Care Patient      Patient will benefit from skilled therapeutic intervention in order to improve the following deficits and impairments:  Improper body mechanics, Pain, Abnormal gait, Difficulty walking, Decreased range of motion, Decreased mobility, Decreased strength, Decreased endurance, Decreased activity tolerance  Visit Diagnosis: Pain in left ankle and joints of left foot - Plan: PT plan of care cert/re-cert  Muscle weakness (generalized) - Plan: PT plan of care cert/re-cert  Other symptoms and signs involving the musculoskeletal system - Plan: PT plan of care cert/re-cert     Problem List Patient Active Problem List   Diagnosis Date Noted  . Acquired subluxation of left ankle 05/30/2016  . Microalbuminuria 02/01/2016  . BMI 36.0-36.9,adult 01/13/2016  . Uncontrolled diabetes mellitus ([place])   . Hypertension, benign   . Hyperlipidemia   . Posterior tibial tendon dysfunction (PTTD) of left lower extremity 04/10/2015    Freman Lapage Nilda Simmer PT, MPH  06/07/2016, 6:06 PM  [facility] [place], [place], [address] Phone: [phone]   Fax:  [phone]  Name: Julie Osborne MRN: [id number] Date of Birth: [birth year]

## 2016-06-07 NOTE — Patient Instructions (Signed)
Sit to Stand (Sitting)    Sit on CHAIR. Tighten pelvic floor and abdomen and hold. Lean forward. Stand up. Repeat _10__ times, 2 sets. Do _1__ times a day.  Therapeutic - Bridging    Lift buttocks, keeping back straight and arms on floor. Hold __1-5__ seconds. Repeat _10___ times, 2 sets  Piriformis Stretch    Lying on back, pull right knee toward opposite shoulder. Hold _30___ seconds. Repeat _2-3___ times. Do __2__ sessions per day.  [email or web address]   KNEE: Quadriceps - Prone    Place strap around ankle. Bring ankle toward buttocks. Press hip into surface. Hold _30__ seconds. _2__ reps per set, _2__ sets per day, _5_ days per week

## 2016-06-14 ENCOUNTER — Ambulatory Visit (INDEPENDENT_AMBULATORY_CARE_PROVIDER_SITE_OTHER): Payer: BC Managed Care – PPO | Admitting: Rehabilitative and Restorative Service Providers"

## 2016-06-14 DIAGNOSIS — R29898 Other symptoms and signs involving the musculoskeletal system: Secondary | ICD-10-CM | POA: Diagnosis not present

## 2016-06-14 DIAGNOSIS — M25572 Pain in left ankle and joints of left foot: Secondary | ICD-10-CM | POA: Diagnosis not present

## 2016-06-14 DIAGNOSIS — M6281 Muscle weakness (generalized): Secondary | ICD-10-CM

## 2016-06-14 NOTE — Therapy (Signed)
[place], [place], [address] Phone: [phone]   Fax:  [phone]  Physical Therapy Treatment  Patient Details  Name: Julie Osborne MRN: [id number] Date of Birth: 08/22/1950 Referring Provider: Dr Lynne Leader   Encounter Date: 06/14/2016      PT End of Session - 06/14/16 1708    Visit Number 2   Number of Visits 6   Date for PT Re-Evaluation 07/19/16   PT Start Time 1700   PT Stop Time 2094   PT Time Calculation (min) 53 min   Activity Tolerance Patient tolerated treatment well      Past Medical History:  Diagnosis Date  . Apnea   . Degenerative arthritis   . Diabetic neuropathy ([place]) 2017  . Diverticulosis 06/19/2013   sigmoid colon on colonoscopy  . Hyperlipidemia 2000  . Hypertension, benign 1996  . Lumbar radiculopathy    Severe L4/L5 radiculopathy by old records.   . Tenosynovitis of ankle 2017   post tibial tendon dysfunction stage 5 (PTTD)  . Uncontrolled diabetes mellitus ([place]) 2005    Past Surgical History:  Procedure Laterality Date  . EXPLORATORY LAPAROTOMY  1976   endometriosis  . MINOR HEMORRHOIDECTOMY  2006  . OTHER SURGICAL HISTORY Right 1975   venous ligation    There were no vitals filed for this visit.      Subjective Assessment - 06/14/16 1709    Subjective Gradually working up wearing time with orthotics. She notices increased pain in the hips with extended wearing time and incresaed foot pain when she doesn't wear the orthotics. Working on the exercises at home.    Currently in Pain? Yes   Pain Score 5    Pain Location Foot   Pain Orientation Left   Pain Descriptors / Indicators Sharp   Pain Type Chronic pain   Pain Radiating Towards up into the Lt lateral leg    Pain Onset More than a month ago   Pain Frequency Intermittent                         [facility] Adult PT Treatment/Exercise - 06/14/16 0001      Knee/Hip Exercises:  Stretches   Quad Stretch 3 reps;30 seconds   Piriformis Stretch 2 reps;30 seconds     Knee/Hip Exercises: Seated   Sit to [employer] 10 reps;without UE support     Knee/Hip Exercises: Supine   Bridges 1 set;10 reps   Bridges with Clamshell Strengthening;Both;10 reps  green TB     Knee/Hip Exercises: Sidelying   Hip ABduction Strengthening;Left;10 reps  hip slightly extended; leading up with heel      Ultrasound   Ultrasound Location Lt plantar surface; dorsum of Lt foot laterally    Ultrasound Parameters 1 mHz; 50%; 1 mHz; 1.2 w/cm2; 4/64min each area    Ultrasound Goals Pain;Other (Comment)  tightness      Vasopneumatic   Number Minutes Vasopneumatic  15 minutes   Vasopnuematic Location  Ankle  Lt   Vasopneumatic Pressure Low   Vasopneumatic Temperature  34 deg     Manual Therapy   Manual therapy comments --   Soft tissue mobilization --   Passive ROM --   Kinesiotex --  Roc tape - post tib path; arch support      Ankle Exercises: Seated   ABC's 1 rep   Other Seated Ankle Exercises 4 way ankle TB red x DF/inv/eve; green PF x  10 slow eccentric release                 PT Education - 06/14/16 1741    Education provided Yes   Education Details HEP    Person(s) Educated Patient   Methods Explanation;Demonstration;Tactile cues;Verbal cues;Handout   Comprehension Verbalized understanding;Returned demonstration;Verbal cues required;Tactile cues required             PT Long Term Goals - 06/14/16 1708      PT LONG TERM GOAL #1   Title Improve strength bilat LE's 1/2 muscle grade 07/19/16   Time 6   Period Weeks   Status On-going     PT LONG TERM GOAL #2   Title Improve functional activities including sit to stand 07/19/16   Time 6   Period Weeks   Status On-going     PT LONG TERM GOAL #3   Title Independent HEP 07/19/16   Time 6   Period Weeks   Status On-going     PT LONG TERM GOAL #4   Title Improve FOTO to </= 41% limitation 07/19/16   Time 6    Period Weeks   Status On-going               Plan - 06/14/16 1753    Clinical Impression Statement No significant changes reported. Patient tolerated exercise well and demonstrates correct form. Aded exercises without difficulty. Added trial of tape to posterior tib and plantar surface of Lt foot.    Rehab Potential Good   PT Frequency 1x / week   PT Duration 6 weeks   PT Treatment/Interventions Patient/family education;ADLs/Self Care Home Management;Cryotherapy;Electrical Stimulation;Iontophoresis 4mg /ml Dexamethasone;Moist Heat;Ultrasound;Dry needling;Manual techniques;Therapeutic activities;Therapeutic exercise   PT Next Visit Plan stretching; strengthening; functional activities and gait training; manual work and modalities as indicated; assess response to taping    Consulted and Agree with Plan of Care Patient      Patient will benefit from skilled therapeutic intervention in order to improve the following deficits and impairments:  Improper body mechanics, Pain, Abnormal gait, Difficulty walking, Decreased range of motion, Decreased mobility, Decreased strength, Decreased endurance, Decreased activity tolerance  Visit Diagnosis: Pain in left ankle and joints of left foot  Muscle weakness (generalized)  Other symptoms and signs involving the musculoskeletal system     Problem List Patient Active Problem List   Diagnosis Date Noted  . Acquired subluxation of left ankle 05/30/2016  . Microalbuminuria 02/01/2016  . BMI 36.0-36.9,adult 01/13/2016  . Uncontrolled diabetes mellitus ([place])   . Hypertension, benign   . Hyperlipidemia   . Posterior tibial tendon dysfunction (PTTD) of left lower extremity 04/10/2015    Lelia Jons Nilda Simmer PT, MPH  06/14/2016, 5:56 PM  [facility] [place], [place], [address] Phone: [phone]   Fax:  [phone]  Name: Britt Theard MRN: [id number] Date of Birth:  27-Mar-1951

## 2016-06-14 NOTE — Patient Instructions (Addendum)
Dorsiflexion: Resisted    Facing anchor, tubing around left foot, pull toward face.  Repeat __10__ times per set. Do _2-3___ sets per session. Do __1__ sessions per day.    Plantar Flexion: Resisted    Anchor behind, tubing around left foot, press down. Repeat _10___ times per set. Do __2-3__ sets per session. Do __1__ sessions per day.     Inversion: Resisted    Cross legs with right leg underneath, foot in tubing loop. Hold tubing around other foot to resist and turn foot in. Repeat __10__ times per set. Do __2-3__ sets per session. Do _1___ sessions per day.     Eversion: Resisted    With right foot in tubing loop, hold tubing around other foot to resist and turn foot out. Repeat __10__ times per set. Do _2-3___ sets per session. Do _1___ sessions per day.   Ankle Alphabet    Using left ankle and foot only, trace the letters of the alphabet. Perform A to Z. Repeat __1__ times per set. Do __1__ sessions per day.    Strengthening: Hip Abduction (Side-Lying)    Tighten muscles on front of left thigh, then lift leg _10-12___ inches from surface, keeping knee locked.  Repeat _10___ times per set. Do _2-3___ sets per session. Do __1__ sessions per day.

## 2016-06-15 NOTE — Progress Notes (Signed)
[facility] PRE[facility] Weekly Session   Patient Details  Name: Julie Osborne MRN: [id number] Date of Birth: April 12, 1950 Age: 65 y.o. PCP: [name], DO  There were no vitals filed for this visit.      [facility] [employer] Weekly seesion - 06/15/16 0900      Weekly Session   Topic Discussed Restaurant Eating   Classes attended to date 1   Comments First class actually on 06/11/16       [name] 06/15/2016, 9:41 AM

## 2016-06-17 ENCOUNTER — Encounter: Payer: Self-pay | Admitting: Family Medicine

## 2016-06-19 ENCOUNTER — Encounter: Payer: Self-pay | Admitting: *Deleted

## 2016-06-19 ENCOUNTER — Other Ambulatory Visit: Payer: Self-pay | Admitting: *Deleted

## 2016-06-19 NOTE — Telephone Encounter (Signed)
levemir pen needles called to [facility] [address]

## 2016-06-21 ENCOUNTER — Encounter: Payer: BC Managed Care – PPO | Admitting: Rehabilitative and Restorative Service Providers"

## 2016-06-25 ENCOUNTER — Encounter: Payer: Self-pay | Admitting: Family Medicine

## 2016-06-25 ENCOUNTER — Ambulatory Visit (INDEPENDENT_AMBULATORY_CARE_PROVIDER_SITE_OTHER): Payer: BC Managed Care – PPO | Admitting: Family Medicine

## 2016-06-25 ENCOUNTER — Telehealth: Payer: Self-pay | Admitting: Family Medicine

## 2016-06-25 VITALS — BP 127/80 | HR 79 | Temp 97.5°F | Resp 20 | Wt 264.5 lb

## 2016-06-25 DIAGNOSIS — R300 Dysuria: Secondary | ICD-10-CM | POA: Diagnosis not present

## 2016-06-25 DIAGNOSIS — R350 Frequency of micturition: Secondary | ICD-10-CM

## 2016-06-25 LAB — POC URINALSYSI DIPSTICK (AUTOMATED)
BILIRUBIN UA: NEGATIVE
GLUCOSE UA: NEGATIVE
Ketones, UA: NEGATIVE
Nitrite, UA: POSITIVE
Spec Grav, UA: 1.015 (ref 1.030–1.035)
Urobilinogen, UA: 0.2 (ref ?–2.0)
pH, UA: 7 (ref 5.0–8.0)

## 2016-06-25 MED ORDER — CEPHALEXIN 500 MG PO CAPS: 500.0000 mg | ORAL_CAPSULE | Freq: Three times a day (TID) | ORAL | 0 refills | Status: DC

## 2016-06-25 NOTE — Progress Notes (Signed)
Julie Osborne , 1950-06-04, 66 y.o., female MRN: [id number] Patient Care Team    Relationship Specialty Notifications Start End  Ma Hillock, DO PCP - General Family Medicine  01/13/16     CC: dysuria Subjective: Pt presents for an acute OV with complaints of dysuria of 2 days duration.  Associated symptoms include urinary frequency, hematuria. She denies fever, chills, back/abd pain, nausea or vomit.  Pt has tried AZO to ease their symptoms. Her BG has been elevated over the  last week.   Allergies  Allergen Reactions  . Statins     Lipitor,simvatatin, pravastatin.welchol cause myalgia   Social History  Substance Use Topics  . Smoking status: Former Smoker    Types: Cigarettes  . Smokeless tobacco: Never Used  . Alcohol use No   Past Medical History:  Diagnosis Date  . Apnea   . Degenerative arthritis   . Diabetic neuropathy ([place]) 2017  . Diverticulosis 06/19/2013   sigmoid colon on colonoscopy  . Hyperlipidemia 2000  . Hypertension, benign 1996  . Lumbar radiculopathy    Severe L4/L5 radiculopathy by old records.   . Tenosynovitis of ankle 2017   post tibial tendon dysfunction stage 5 (PTTD)  . Uncontrolled diabetes mellitus ([place]) 2005   Past Surgical History:  Procedure Laterality Date  . EXPLORATORY LAPAROTOMY  1976   endometriosis  . MINOR HEMORRHOIDECTOMY  2006  . OTHER SURGICAL HISTORY Right 1975   venous ligation   Family History  Problem Relation Age of Onset  . COPD Mother   . Diabetes Mellitus II Mother   . Heart failure Mother   . Osteoarthritis Father   . Lung cancer Father 18  . Diabetes Sister   . Diabetes Brother   . Dementia Maternal Grandmother   . Heart disease Maternal Grandmother   . Heart disease Maternal Grandfather   . Stroke Maternal Grandfather   . Heart failure Maternal Aunt   . Lung cancer Paternal Grandfather   . Lung cancer Paternal Aunt    Allergies as of 06/25/2016      Reactions   Statins    Lipitor,simvatatin,  pravastatin.welchol cause myalgia      Medication List       Accurate as of 06/25/16 10:52 AM. Always use your most recent med list.          albuterol 108 (90 Base) MCG/ACT inhaler Commonly known as:  PROVENTIL HFA;VENTOLIN HFA Inhale into the lungs every 6 (six) hours as needed for wheezing or shortness of breath.   Amlodipine-Valsartan-HCTZ 10-320-25 MG Tabs Take 1 tablet by mouth daily.   blood glucose meter kit and supplies Kit Dispense based on patient and insurance preference. Blood glucose x four times daily as directed. DDX:  ICD-9 250.00, 250.01.   diclofenac sodium 1 % Gel Commonly known as:  VOLTAREN Apply 4 g topically 4 (four) times daily. To affected joint.   docusate sodium 100 MG capsule Commonly known as:  COLACE Take 100 mg by mouth 2 (two) times daily.   FIBER-CAPS PO Take by mouth.   gabapentin 100 MG capsule Commonly known as:  NEURONTIN 300 mg in the morning, 400 mg at night.   glucose blood test strip Commonly known as:  ONETOUCH VERIO Test blood sugar before meals and at bedtime   Insulin Detemir 100 UNIT/ML Pen Commonly known as:  LEVEMIR FLEXTOUCH Inject 45-50 Units into the skin 2 (two) times daily.   metFORMIN 1000 MG tablet Commonly known as:  GLUCOPHAGE Take  1 tablet (1,000 mg total) by mouth 2 (two) times daily with a meal.   montelukast 10 MG tablet Commonly known as:  SINGULAIR Take 10 mg by mouth at bedtime.   multivitamin capsule Take 1 capsule by mouth daily.   ONETOUCH DELICA LANCETS 36V Misc 1 applicator by Does not apply route 4 (four) times daily -  before meals and at bedtime.   OSTEO BI-FLEX JOINT SHIELD PO Take 1 capsule by mouth daily.   rosuvastatin 5 MG tablet Commonly known as:  CRESTOR Take 1 tablet (5 mg total) by mouth every other day.   tiZANidine 4 MG tablet Commonly known as:  ZANAFLEX Take 4 mg by mouth every 6 (six) hours as needed for muscle spasms.       Results for orders placed or  performed in visit on 06/25/16 (from the past 24 hour(s))  POCT Urinalysis Dipstick (Automated)     Status: Abnormal   Collection Time: 06/25/16 10:34 AM  Result Value Ref Range   Color, UA orange    Clarity, UA slightly cloudy    Glucose, UA negative    Bilirubin, UA negative    Ketones, UA negative    Spec Grav, UA 1.015 1.030 - 1.035   Blood, UA moderate    pH, UA 7.0 5.0 - 8.0   Protein, UA trace    Urobilinogen, UA 0.2 Negative - 2.0   Nitrite, UA positive    Leukocytes, UA small (1+) (A) Negative   No results found.   ROS: Negative, with the exception of above mentioned in HPI  Objective:  BP 127/80 (BP Location: Left Arm, Patient Position: Sitting, Cuff Size: Normal)   Pulse 79   Temp 97.5 F (36.4 C)   Resp 20   Wt 264 lb 8 oz (120 kg)   SpO2 100%   BMI 36.89 kg/m  Body mass index is 36.89 kg/m. Gen: Afebrile. No acute distress.  HENT: AT. [place].MMM, Conjunctiva without redness, discharge or icterus. CV: RRR  Abd: Soft. obese. NTND. BS present. no Masses palpated.  MSK: no CVA tenderness.  Neuro: Normal gait. PERLA. EOMi. Alert. Oriented.   Results for orders placed or performed in visit on 06/25/16 (from the past 24 hour(s))  POCT Urinalysis Dipstick (Automated)     Status: Abnormal   Collection Time: 06/25/16 10:34 AM  Result Value Ref Range   Color, UA orange    Clarity, UA slightly cloudy    Glucose, UA negative    Bilirubin, UA negative    Ketones, UA negative    Spec Grav, UA 1.015 1.030 - 1.035   Blood, UA moderate    pH, UA 7.0 5.0 - 8.0   Protein, UA trace    Urobilinogen, UA 0.2 Negative - 2.0   Nitrite, UA positive    Leukocytes, UA small (1+) (A) Negative    Assessment/Plan: Julie Osborne is a 66 y.o. female present for acute OV for UTI - urine culture, rest, hydrate.  - start keflex TID - can continue AZO - F/U PRN   electronically signed by:  [name], DO  [place]

## 2016-06-25 NOTE — Patient Instructions (Signed)
Rest, hydrate.  Start kelfex every 8 hours.  I will send urine for culture and call when results available.

## 2016-06-25 NOTE — Telephone Encounter (Signed)
Thinks she has UTI and wants to know if something can be called in?  Thank you,  -LL

## 2016-06-25 NOTE — Telephone Encounter (Signed)
Patient notified that she will have to seen for an office visit.  Appointment scheduled.

## 2016-06-27 ENCOUNTER — Telehealth: Payer: Self-pay | Admitting: Family Medicine

## 2016-06-27 LAB — URINE CULTURE: Organism ID, Bacteria: NO GROWTH

## 2016-06-27 NOTE — Telephone Encounter (Signed)
Please call pt: - her urine is normal, no bacteria growth. She does not need to continue the abx, she can if she desires.  - This is two times she has had UTI symptoms without bacteria growth, suggesting more of a symptomatic abacteruria (or Urethral syndrome) condition more than UTI infection. - Things she can do at home to try to avoid: Avoid products that can cause irritation and allergic reactions, such as perfumed products, soaps, bubble baths, scented panty liners etc. Practice good personal hygiene (wipe front to back only). Drink a lot of water, especially cranberry juice. Consider taking a cranberry pill supplement.  - Try adding a probiotic to diet.  - I would like her to look up common bladder irritants (john hopkin's as a good page), sometimes the irritation is in something we actually consume.  - Unfortunately,  if she gets repeat symptoms she would need to be tested to make sure it is not a true UTI, especially if she experiences systemic symptoms (fever, chills, nausea, abd pain, dizziness). But, hopefully the above recommendations will help it occur less frequently. If continues would like to refer to urology.

## 2016-06-27 NOTE — Telephone Encounter (Signed)
Spoke with patient reviewed lab results and instructions. Patient verbalized understanding. 

## 2016-06-28 ENCOUNTER — Encounter: Payer: BC Managed Care – PPO | Admitting: Rehabilitative and Restorative Service Providers"

## 2016-07-15 ENCOUNTER — Other Ambulatory Visit: Payer: Self-pay | Admitting: Family Medicine

## 2016-07-15 DIAGNOSIS — E1149 Type 2 diabetes mellitus with other diabetic neurological complication: Secondary | ICD-10-CM

## 2016-07-18 ENCOUNTER — Other Ambulatory Visit: Payer: Self-pay | Admitting: Family Medicine

## 2016-07-18 DIAGNOSIS — E1149 Type 2 diabetes mellitus with other diabetic neurological complication: Secondary | ICD-10-CM

## 2016-07-19 ENCOUNTER — Encounter: Payer: BC Managed Care – PPO | Admitting: Rehabilitative and Restorative Service Providers"

## 2016-07-19 NOTE — Telephone Encounter (Signed)
Dr. Kuneff pt.  

## 2016-07-26 ENCOUNTER — Ambulatory Visit (INDEPENDENT_AMBULATORY_CARE_PROVIDER_SITE_OTHER): Payer: BC Managed Care – PPO | Admitting: Rehabilitative and Restorative Service Providers"

## 2016-07-26 ENCOUNTER — Encounter: Payer: Self-pay | Admitting: Family Medicine

## 2016-07-26 ENCOUNTER — Encounter: Payer: Self-pay | Admitting: Rehabilitative and Restorative Service Providers"

## 2016-07-26 DIAGNOSIS — M25572 Pain in left ankle and joints of left foot: Secondary | ICD-10-CM

## 2016-07-26 DIAGNOSIS — M6281 Muscle weakness (generalized): Secondary | ICD-10-CM

## 2016-07-26 DIAGNOSIS — R29898 Other symptoms and signs involving the musculoskeletal system: Secondary | ICD-10-CM | POA: Diagnosis not present

## 2016-07-26 NOTE — Therapy (Signed)
[place], [place], [address] Phone: [phone]   Fax:  [phone]  Physical Therapy Treatment  Patient Details  Name: Julie Osborne MRN: [id number] Date of Birth: 04-18-1950 Referring Provider: Dr Lynne Leader   Encounter Date: 07/26/2016      PT End of Session - 07/26/16 1702    Visit Number 3   Number of Visits 8   Date for PT Re-Evaluation 08/23/16   PT Start Time 1700   PT Stop Time 1754   PT Time Calculation (min) 54 min   Activity Tolerance Patient tolerated treatment well      Past Medical History:  Diagnosis Date  . Apnea   . Degenerative arthritis   . Diabetic neuropathy ([place]) 2017  . Diverticulosis 06/19/2013   sigmoid colon on colonoscopy  . Hyperlipidemia 2000  . Hypertension, benign 1996  . Lumbar radiculopathy    Severe L4/L5 radiculopathy by old records.   . Tenosynovitis of ankle 2017   post tibial tendon dysfunction stage 5 (PTTD)  . Uncontrolled diabetes mellitus ([place]) 2005    Past Surgical History:  Procedure Laterality Date  . EXPLORATORY LAPAROTOMY  1976   endometriosis  . MINOR HEMORRHOIDECTOMY  2006  . OTHER SURGICAL HISTORY Right 1975   venous ligation    There were no vitals filed for this visit.      Subjective Assessment - 07/26/16 1706    Subjective Tape works great! lasted 2 weeks with taking care of the tape. Would like to learn how to tape foot for home. Thinks she will only come in for one more appointment due to financial concerns. Does not remember the band exercises. Working woth a Clinical research associate some at [employer]. Knows she needs to get more consistent with exercise.    Currently in Pain? Yes   Pain Score 4    Pain Location Foot   Pain Orientation Left   Pain Descriptors / Indicators Dull;Burning   Pain Type Chronic pain   Pain Onset More than a month ago   Pain Frequency Intermittent            [facility] PT Assessment - 07/26/16 0001       Assessment   Medical Diagnosis Lt posterior tibial tendon dysfunction    Referring Provider Dr Lynne Leader    Onset Date/Surgical Date 02/22/16   Hand Dominance Right   Next MD Visit 08/17/16   Prior Therapy none for foot      AROM   Right Ankle Dorsiflexion 7   Right Ankle Plantar Flexion 41   Right Ankle Inversion 20   Right Ankle Eversion 9   Left Ankle Dorsiflexion 8   Left Ankle Plantar Flexion 45   Left Ankle Inversion 26   Left Ankle Eversion 19     Strength   Left Hip Extension --  5-/5   Left Hip ABduction 5/5   Right Knee Flexion 5/5   Right Knee Extension 5/5   Left Knee Flexion 5/5   Left Knee Extension 5/5   Right Ankle Dorsiflexion 5/5   Right Ankle Inversion 5/5   Right Ankle Eversion --  5-/5   Left Ankle Dorsiflexion 4+/5   Left Ankle Inversion --  5-/5   Left Ankle Eversion 4+/5     Flexibility   Hamstrings tight Rt 85 deg; Lt 80 deg   Quadriceps tight Rt 95 deg; Lt 90 deg    ITB tight Lt    Piriformis tight Lt  Palpation   Palpation comment tender posterior tibalis into the lateral/dorsum of the foot                      [facility] Adult PT Treatment/Exercise - 07/26/16 0001      Therapeutic Activites    Therapeutic Activities --  reviewed HEP      Knee/Hip Exercises: Aerobic   Stationary Bike Nu step L5 x 7 min      Vasopneumatic   Number Minutes Vasopneumatic  15 minutes   Vasopnuematic Location  Ankle  Lt   Vasopneumatic Pressure Low   Vasopneumatic Temperature  34 deg     Manual Therapy   Manual therapy comments Pt prone    Soft tissue mobilization IASTM Lt plantar surface into med/lat forefoot    Passive ROM PRM/stretching Lt ankle/forefoot    Kinesiotex --  Roc tape - post tib path; arch support      Ankle Exercises: Seated   ABC's 1 rep   Other Seated Ankle Exercises 4 way ankle TB red x DF/inv/eve; green PF x 10 slow eccentric release   reviewed for HEP                      PT Long Term Goals  - 07/26/16 1709      PT LONG TERM GOAL #1   Title Improve strength bilat LE's 1/2 muscle grade 07/19/16   Time 6   Period Weeks   Status On-going     PT LONG TERM GOAL #2   Title Improve functional activities including sit to stand 07/19/16   Time 6   Period Weeks   Status Partially Met     PT LONG TERM GOAL #3   Title Independent HEP 07/19/16   Time 6   Period Weeks   Status On-going     PT LONG TERM GOAL #4   Title Improve FOTO to </= 41% limitation 07/19/16   Time 6   Period Weeks   Status On-going               Plan - 07/26/16 1752    Clinical Impression Statement Julie Osborne reports that she is working with a Clinical research associate at [employer] and feels that her hips and legs are getting stronger. She has worek on the ankle exercises but requires review for correct technique. Excellent response to tape. Will work to learn to tape herself independently. She wants to come for PT one additional visit then hold for at least a month or more due to financial concerns.    Rehab Potential Good   PT Frequency 1x / week   PT Duration 12 weeks   PT Treatment/Interventions Patient/family education;ADLs/Self Care Home Management;Cryotherapy;Electrical Stimulation;Iontophoresis 34m/ml Dexamethasone;Moist Heat;Ultrasound;Dry needling;Manual techniques;Therapeutic activities;Therapeutic exercise   PT Next Visit Plan stretching; strengthening; functional activities and gait training; manual work and modalities as indicated; instruct in taping for home    Consulted and Agree with Plan of Care Patient      Patient will benefit from skilled therapeutic intervention in order to improve the following deficits and impairments:  Improper body mechanics, Pain, Abnormal gait, Difficulty walking, Decreased range of motion, Decreased mobility, Decreased strength, Decreased endurance, Decreased activity tolerance  Visit Diagnosis: Pain in left ankle and joints of left foot - Plan: PT plan of care  cert/re-cert  Muscle weakness (generalized) - Plan: PT plan of care cert/re-cert  Other symptoms and signs involving the musculoskeletal system - Plan: PT plan  of care cert/re-cert     Problem List Patient Active Problem List   Diagnosis Date Noted  . Acquired subluxation of left ankle 05/30/2016  . Microalbuminuria 02/01/2016  . BMI 36.0-36.9,adult 01/13/2016  . Uncontrolled diabetes mellitus ([place])   . Hypertension, benign   . Hyperlipidemia   . Posterior tibial tendon dysfunction (PTTD) of left lower extremity 04/10/2015    Julie Osborne Nilda Simmer PT, MPH  07/26/2016, 6:09 PM  [facility] [place] [address], [place], [address] Phone: [phone]   Fax:  [phone]  Name: Julie Osborne MRN: [id number] Date of Birth: June 17, 1950

## 2016-07-27 ENCOUNTER — Other Ambulatory Visit: Payer: Self-pay | Admitting: Family Medicine

## 2016-07-27 DIAGNOSIS — E1149 Type 2 diabetes mellitus with other diabetic neurological complication: Secondary | ICD-10-CM

## 2016-07-27 MED ORDER — INSULIN DETEMIR 100 UNIT/ML FLEXPEN: 50.0000 [IU] | PEN_INJECTOR | Freq: Two times a day (BID) | SUBCUTANEOUS | 1 refills | Status: DC

## 2016-07-27 NOTE — Progress Notes (Signed)
Sent to [facility] [address], per pt request

## 2016-08-01 NOTE — Progress Notes (Signed)
[facility] [employer] [facility] Weekly Session   Patient Details  Name: Julie Osborne MRN: [id number] Date of Birth: 19-Dec-1950 Age: 66 y.o. PCP: [name], DO  Vitals:        [facility] [employer] Weekly seesion - 08/01/16 0800      Weekly Session   Topic Discussed Hitting roadblocks   Minutes exercised this week 240 minutes  60cardio/60strength/145flexibility   Classes attended to date 2     Things you are grateful for;"mobility, life" Nutrition celebrations:"did not eat after 8 for 5/7 nights:)!" Barriers:"overly tired:( tend to eat unwisely"  [name] 08/01/2016, 8:55 AM

## 2016-08-02 ENCOUNTER — Ambulatory Visit (INDEPENDENT_AMBULATORY_CARE_PROVIDER_SITE_OTHER): Payer: BC Managed Care – PPO | Admitting: Physical Therapy

## 2016-08-02 DIAGNOSIS — R29898 Other symptoms and signs involving the musculoskeletal system: Secondary | ICD-10-CM

## 2016-08-02 DIAGNOSIS — M25572 Pain in left ankle and joints of left foot: Secondary | ICD-10-CM

## 2016-08-02 DIAGNOSIS — M6281 Muscle weakness (generalized): Secondary | ICD-10-CM | POA: Diagnosis not present

## 2016-08-02 NOTE — Therapy (Addendum)
[place], [place], [address] Phone: [phone]   Fax:  [phone]  Physical Therapy Treatment  Patient Details  Name: Julie Osborne MRN: [id number] Date of Birth: October 22, 1950 Referring Provider: Dr Lynne Leader   Encounter Date: 08/02/2016      PT End of Session - 08/02/16 1716    Visit Number 4   Number of Visits 8   Date for PT Re-Evaluation 08/23/16   PT Start Time 8088  pt arrived late   PT Stop Time 1748   PT Time Calculation (min) 33 min   Activity Tolerance Patient tolerated treatment well   Behavior During Therapy [facility] for tasks assessed/performed      Past Medical History:  Diagnosis Date  . Apnea   . Degenerative arthritis   . Diabetic neuropathy ([place]) 2017  . Diverticulosis 06/19/2013   sigmoid colon on colonoscopy  . Hyperlipidemia 2000  . Hypertension, benign 1996  . Lumbar radiculopathy    Severe L4/L5 radiculopathy by old records.   . Tenosynovitis of ankle 2017   post tibial tendon dysfunction stage 5 (PTTD)  . Uncontrolled diabetes mellitus ([place]) 2005    Past Surgical History:  Procedure Laterality Date  . EXPLORATORY LAPAROTOMY  1976   endometriosis  . MINOR HEMORRHOIDECTOMY  2006  . OTHER SURGICAL HISTORY Right 1975   venous ligation    There were no vitals filed for this visit.      Subjective Assessment - 08/02/16 1750    Subjective Pt is accompanied by her husband; she would like him to learn how to massage her foot and apply Rock tape. Tape is present on foot from last session.  She was able to do reciprocal pattern on stairs for first time this week.  She is interested in d/c after todays session as she is changing jobs soon.     Pertinent History AODM; peripherial neuropathy; DJD spine hands foot and ankle Lt; HNT   Patient Stated Goals increase leg strength; to increase endurace; learn exercises for foot and ankle    Currently in Pain? Yes   Pain  Score 2    Pain Location Foot   Pain Orientation Left   Pain Descriptors / Indicators Dull;Aching   Aggravating Factors  prolonged walking    Pain Relieving Factors medicine, ice, massage, tape.             [facility] PT Assessment - 08/02/16 0001      Observation/Other Assessments   Focus on Therapeutic Outcomes (FOTO)  49% limited     AROM   Left Ankle Dorsiflexion 10     Strength   Left Ankle Dorsiflexion 5/5   Left Ankle Plantar Flexion 3+/5   Left Ankle Inversion 2+/5  unable to do heel raise.    Left Ankle Eversion 5/5          [facility] Adult PT Treatment/Exercise - 08/02/16 0001      Self-Care   Self-Care Other Self-Care Comments   Other Self-Care Comments  Pt's husband instructed in method of STM to pt's plantar surface of foot and Rock tape application and safe removal. Both pt and her husband verbalized understanding.      Manual Therapy   Manual therapy comments Pt prone    Soft tissue mobilization STM to Lt plantar surface of foot to decrease pain and fascial adhesions.    Kinesiotex --  Rock tape - post tib path; arch support. to decrease pain.  Ankle Exercises: Seated   Other Seated Ankle Exercises 4 way ankle:  yellow for inversion, blue, then black for PF, blue for DF and eversion -20 for PF, 10 for all other directions. VC for slow motion.                      PT Long Term Goals - 08/02/16 1717      PT LONG TERM GOAL #1   Title Improve strength bilat LE's 1/2 muscle grade 07/19/16   Status Partially Met     PT LONG TERM GOAL #2   Title Improve functional activities including sit to stand 07/19/16   Status Partially Met     PT LONG TERM GOAL #3   Title Independent HEP 07/19/16   Time 6   Status Achieved     PT LONG TERM GOAL #4   Title Improve FOTO to </= 41% limitation 07/19/16   Time 6   Period Weeks   Status Not Met               Plan - 08/02/16 1754    Clinical Impression Statement Limited session due to pt's late  arrival.  HEP was reviewed and additional bands were issued.  Ankle DF ROM slightly improved.  Lt ankle inversion and PF strength remain weak.  Pt and husband verbalized understanding of application of tape. Goals partially met. Pt Verbalized readiness to d/c to HEP.    Rehab Potential Good   PT Frequency 1x / week   PT Duration 12 weeks   PT Treatment/Interventions Patient/family education;ADLs/Self Care Home Management;Cryotherapy;Electrical Stimulation;Iontophoresis 61m/ml Dexamethasone;Moist Heat;Ultrasound;Dry needling;Manual techniques;Therapeutic activities;Therapeutic exercise   PT Next Visit Plan Spoke to supervising PT; will d/c to HEP    Consulted and Agree with Plan of Care Patient      Patient will benefit from skilled therapeutic intervention in order to improve the following deficits and impairments:  Improper body mechanics, Pain, Abnormal gait, Difficulty walking, Decreased range of motion, Decreased mobility, Decreased strength, Decreased endurance, Decreased activity tolerance  Visit Diagnosis: Pain in left ankle and joints of left foot  Muscle weakness (generalized)  Other symptoms and signs involving the musculoskeletal system     Problem List Patient Active Problem List   Diagnosis Date Noted  . Acquired subluxation of left ankle 05/30/2016  . Microalbuminuria 02/01/2016  . BMI 36.0-36.9,adult 01/13/2016  . Uncontrolled diabetes mellitus ([place]   . Hypertension, benign   . Hyperlipidemia   . Posterior tibial tendon dysfunction (PTTD) of left lower extremity 04/10/2015    JKerin Perna PTA 08/02/16 6:01 PM  [place] [address] [address]Phone: [phone]  Fax:  [phone] Name: Julie SolorzanoMRN: [id number] of Birth: 8January 23, 1952 PHYSICAL THERAPY DISCHARGE SUMMARY  Visits from Start of Care: 4  Current functional level related to goals / functional  outcomes: See progress note for discharge status.    Remaining deficits: Continued pain related to structural dysfunction.    Education / Equipment: HEP Plan: Patient agrees to discharge.  Patient goals were partially met. Patient is being discharged due to being pleased with the current functional level.  ?????     Celyn P. HHelene KelpPT, MPH 08/06/16 7:33 AM

## 2016-08-07 ENCOUNTER — Encounter: Payer: Self-pay | Admitting: Family Medicine

## 2016-08-08 ENCOUNTER — Other Ambulatory Visit: Payer: Self-pay | Admitting: *Deleted

## 2016-08-08 DIAGNOSIS — E1149 Type 2 diabetes mellitus with other diabetic neurological complication: Secondary | ICD-10-CM

## 2016-08-08 NOTE — Telephone Encounter (Signed)
90 day supply Rx of levimer was sent to pharmacy on 07/27/16

## 2016-08-09 LAB — HM DIABETES EYE EXAM

## 2016-08-13 ENCOUNTER — Encounter: Payer: Self-pay | Admitting: Family Medicine

## 2016-08-13 ENCOUNTER — Ambulatory Visit (INDEPENDENT_AMBULATORY_CARE_PROVIDER_SITE_OTHER): Payer: BC Managed Care – PPO | Admitting: Family Medicine

## 2016-08-13 ENCOUNTER — Other Ambulatory Visit (HOSPITAL_COMMUNITY)
Admission: RE | Admit: 2016-08-13 | Discharge: 2016-08-13 | Disposition: A | Discharge status: Home / Self Care | Payer: BC Managed Care – PPO | Source: Ambulatory Visit | Attending: Family Medicine | Admitting: Family Medicine

## 2016-08-13 VITALS — BP 123/76 | HR 74 | Temp 97.8°F | Resp 20 | Ht 71.0 in | Wt 263.0 lb

## 2016-08-13 DIAGNOSIS — Z0000 Encounter for general adult medical examination without abnormal findings: Secondary | ICD-10-CM | POA: Insufficient documentation

## 2016-08-13 DIAGNOSIS — E7849 Other hyperlipidemia: Secondary | ICD-10-CM

## 2016-08-13 DIAGNOSIS — I10 Essential (primary) hypertension: Secondary | ICD-10-CM | POA: Insufficient documentation

## 2016-08-13 DIAGNOSIS — Z1231 Encounter for screening mammogram for malignant neoplasm of breast: Secondary | ICD-10-CM

## 2016-08-13 DIAGNOSIS — R809 Proteinuria, unspecified: Secondary | ICD-10-CM | POA: Insufficient documentation

## 2016-08-13 DIAGNOSIS — E2839 Other primary ovarian failure: Secondary | ICD-10-CM | POA: Diagnosis not present

## 2016-08-13 DIAGNOSIS — E784 Other hyperlipidemia: Secondary | ICD-10-CM | POA: Insufficient documentation

## 2016-08-13 DIAGNOSIS — Z124 Encounter for screening for malignant neoplasm of cervix: Secondary | ICD-10-CM | POA: Diagnosis present

## 2016-08-13 DIAGNOSIS — Z23 Encounter for immunization: Secondary | ICD-10-CM | POA: Diagnosis not present

## 2016-08-13 DIAGNOSIS — Z1239 Encounter for other screening for malignant neoplasm of breast: Secondary | ICD-10-CM

## 2016-08-13 DIAGNOSIS — Z6836 Body mass index (BMI) 36.0-36.9, adult: Secondary | ICD-10-CM | POA: Diagnosis not present

## 2016-08-13 LAB — MICROALBUMIN / CREATININE URINE RATIO
CREATININE, U: 54.2 mg/dL
MICROALB UR: 0.7 mg/dL (ref 0.0–1.9)
Microalb Creat Ratio: 1.3 mg/g (ref 0.0–30.0)

## 2016-08-13 NOTE — Patient Instructions (Signed)
I am ordering your mammogram and bone density, they will call you to schedule.   I will call you with all labs once I get them.   Health Maintenance, Female Adopting a healthy lifestyle and getting preventive care can go a long way to promote health and wellness. Talk with your health care provider about what schedule of regular examinations is right for you. This is a good chance for you to check in with your provider about disease prevention and staying healthy. In between checkups, there are plenty of things you can do on your own. Experts have done a lot of research about which lifestyle changes and preventive measures are most likely to keep you healthy. Ask your health care provider for more information. Weight and diet Eat a healthy diet  Be sure to include plenty of vegetables, fruits, low-fat dairy products, and lean protein.  Do not eat a lot of foods high in solid fats, added sugars, or salt.  Get regular exercise. This is one of the most important things you can do for your health.  Most adults should exercise for at least 150 minutes each week. The exercise should increase your heart rate and make you sweat (moderate-intensity exercise).  Most adults should also do strengthening exercises at least twice a week. This is in addition to the moderate-intensity exercise. Maintain a healthy weight  Body mass index (BMI) is a measurement that can be used to identify possible weight problems. It estimates body fat based on height and weight. Your health care provider can help determine your BMI and help you achieve or maintain a healthy weight.  For females 50 years of age and older:  A BMI below 18.5 is considered underweight.  A BMI of 18.5 to 24.9 is normal.  A BMI of 25 to 29.9 is considered overweight.  A BMI of 30 and above is considered obese. Watch levels of cholesterol and blood lipids  You should start having your blood tested for lipids and cholesterol at 64 years of  age, then have this test every 5 years.  You may need to have your cholesterol levels checked more often if:  Your lipid or cholesterol levels are high.  You are older than 64 years of age.  You are at high risk for heart disease. Cancer screening Lung Cancer  Lung cancer screening is recommended for adults 23-41 years old who are at high risk for lung cancer because of a history of smoking.  A yearly low-dose CT scan of the lungs is recommended for people who:  Currently smoke.  Have quit within the past 15 years.  Have at least a 30-pack-year history of smoking. A pack year is smoking an average of one pack of cigarettes a day for 1 year.  Yearly screening should continue until it has been 15 years since you quit.  Yearly screening should stop if you develop a health problem that would prevent you from having lung cancer treatment. Breast Cancer  Practice breast self-awareness. This means understanding how your breasts normally appear and feel.  It also means doing regular breast self-exams. Let your health care provider know about any changes, no matter how small.  If you are in your 20s or 30s, you should have a clinical breast exam (CBE) by a health care provider every 1-3 years as part of a regular health exam.  If you are 53 or older, have a CBE every year. Also consider having a breast X-ray (mammogram) every year.  If you have a family history of breast cancer, talk to your health care provider about genetic screening.  If you are at high risk for breast cancer, talk to your health care provider about having an MRI and a mammogram every year.  Breast cancer gene (BRCA) assessment is recommended for women who have family members with BRCA-related cancers. BRCA-related cancers include:  Breast.  Ovarian.  Tubal.  Peritoneal cancers.  Results of the assessment will determine the need for genetic counseling and BRCA1 and BRCA2 testing. Cervical Cancer  Your  health care provider may recommend that you be screened regularly for cancer of the pelvic organs (ovaries, uterus, and vagina). This screening involves a pelvic examination, including checking for microscopic changes to the surface of your cervix (Pap test). You may be encouraged to have this screening done every 3 years, beginning at age 26.  For women ages 41-65, health care providers may recommend pelvic exams and Pap testing every 3 years, or they may recommend the Pap and pelvic exam, combined with testing for human papilloma virus (HPV), every 5 years. Some types of HPV increase your risk of cervical cancer. Testing for HPV may also be done on women of any age with unclear Pap test results.  Other health care providers may not recommend any screening for nonpregnant women who are considered low risk for pelvic cancer and who do not have symptoms. Ask your health care provider if a screening pelvic exam is right for you.  If you have had past treatment for cervical cancer or a condition that could lead to cancer, you need Pap tests and screening for cancer for at least 20 years after your treatment. If Pap tests have been discontinued, your risk factors (such as having a new sexual partner) need to be reassessed to determine if screening should resume. Some women have medical problems that increase the chance of getting cervical cancer. In these cases, your health care provider may recommend more frequent screening and Pap tests. Colorectal Cancer  This type of cancer can be detected and often prevented.  Routine colorectal cancer screening usually begins at 64 years of age and continues through 64 years of age.  Your health care provider may recommend screening at an earlier age if you have risk factors for colon cancer.  Your health care provider may also recommend using home test kits to check for hidden blood in the stool.  A small camera at the end of a tube can be used to examine your  colon directly (sigmoidoscopy or colonoscopy). This is done to check for the earliest forms of colorectal cancer.  Routine screening usually begins at age 49.  Direct examination of the colon should be repeated every 5-10 years through 64 years of age. However, you may need to be screened more often if early forms of precancerous polyps or small growths are found. Skin Cancer  Check your skin from head to toe regularly.  Tell your health care provider about any new moles or changes in moles, especially if there is a change in a mole's shape or color.  Also tell your health care provider if you have a mole that is larger than the size of a pencil eraser.  Always use sunscreen. Apply sunscreen liberally and repeatedly throughout the day.  Protect yourself by wearing long sleeves, pants, a wide-brimmed hat, and sunglasses whenever you are outside. Heart disease, diabetes, and high blood pressure  High blood pressure causes heart disease and increases the risk  of stroke. High blood pressure is more likely to develop in:  People who have blood pressure in the high end of the normal range (130-139/85-89 mm Hg).  People who are overweight or obese.  People who are African American.  If you are 68-63 years of age, have your blood pressure checked every 3-5 years. If you are 73 years of age or older, have your blood pressure checked every year. You should have your blood pressure measured twice-once when you are at a hospital or clinic, and once when you are not at a hospital or clinic. Record the average of the two measurements. To check your blood pressure when you are not at a hospital or clinic, you can use:  An automated blood pressure machine at a pharmacy.  A home blood pressure monitor.  If you are between 45 years and 38 years old, ask your health care provider if you should take aspirin to prevent strokes.  Have regular diabetes screenings. This involves taking a blood sample to  check your fasting blood sugar level.  If you are at a normal weight and have a low risk for diabetes, have this test once every three years after 64 years of age.  If you are overweight and have a high risk for diabetes, consider being tested at a younger age or more often. Preventing infection Hepatitis B  If you have a higher risk for hepatitis B, you should be screened for this virus. You are considered at high risk for hepatitis B if:  You were born in a country where hepatitis B is common. Ask your health care provider which countries are considered high risk.  Your parents were born in a high-risk country, and you have not been immunized against hepatitis B (hepatitis B vaccine).  You have HIV or AIDS.  You use needles to inject street drugs.  You live with someone who has hepatitis B.  You have had sex with someone who has hepatitis B.  You get hemodialysis treatment.  You take certain medicines for conditions, including cancer, organ transplantation, and autoimmune conditions. Hepatitis C  Blood testing is recommended for:  Everyone born from [birth year] through 1963.  Anyone with known risk factors for hepatitis C. Sexually transmitted infections (STIs)  You should be screened for sexually transmitted infections (STIs) including gonorrhea and chlamydia if:  You are sexually active and are younger than 64 years of age.  You are older than 64 years of age and your health care provider tells you that you are at risk for this type of infection.  Your sexual activity has changed since you were last screened and you are at an increased risk for chlamydia or gonorrhea. Ask your health care provider if you are at risk.  If you do not have HIV, but are at risk, it may be recommended that you take a prescription medicine daily to prevent HIV infection. This is called pre-exposure prophylaxis (PrEP). You are considered at risk if:  You are sexually active and do not regularly use  condoms or know the HIV status of your partner(s).  You take drugs by injection.  You are sexually active with a partner who has HIV. Talk with your health care provider about whether you are at high risk of being infected with HIV. If you choose to begin PrEP, you should first be tested for HIV. You should then be tested every 3 months for as long as you are taking PrEP. Pregnancy  If you are  premenopausal and you may become pregnant, ask your health care provider about preconception counseling.  If you may become pregnant, take 400 to 800 micrograms (mcg) of folic acid every day.  If you want to prevent pregnancy, talk to your health care provider about birth control (contraception). Osteoporosis and menopause  Osteoporosis is a disease in which the bones lose minerals and strength with aging. This can result in serious bone fractures. Your risk for osteoporosis can be identified using a bone density scan.  If you are 58 years of age or older, or if you are at risk for osteoporosis and fractures, ask your health care provider if you should be screened.  Ask your health care provider whether you should take a calcium or vitamin D supplement to lower your risk for osteoporosis.  Menopause may have certain physical symptoms and risks.  Hormone replacement therapy may reduce some of these symptoms and risks. Talk to your health care provider about whether hormone replacement therapy is right for you. Follow these instructions at home:  Schedule regular health, dental, and eye exams.  Stay current with your immunizations.  Do not use any tobacco products including cigarettes, chewing tobacco, or electronic cigarettes.  If you are pregnant, do not drink alcohol.  If you are breastfeeding, limit how much and how often you drink alcohol.  Limit alcohol intake to no more than 1 drink per day for nonpregnant women. One drink equals 12 ounces of beer, 5 ounces of wine, or 1 ounces of  hard liquor.  Do not use street drugs.  Do not share needles.  Ask your health care provider for help if you need support or information about quitting drugs.  Tell your health care provider if you often feel depressed.  Tell your health care provider if you have ever been abused or do not feel safe at home. This information is not intended to replace advice given to you by your health care provider. Make sure you discuss any questions you have with your health care provider. Document Released: 10/09/2010 Document Revised: 09/01/2015 Document Reviewed: 12/28/2014 Elsevier Interactive Patient Education  2017 [employer].

## 2016-08-13 NOTE — Progress Notes (Signed)
Patient ID: Julie Osborne, female  DOB: 07/23/1950, 66 y.o.   MRN: [id number] Patient Care Team    Relationship Specialty Notifications Start End  Ma Hillock, DO PCP - General Family Medicine  01/13/16     Chief Complaint  Patient presents with  . Annual Exam  . Gynecologic Exam    Subjective:  Julie Osborne is a 66 y.o.  Female  present for CPE with PAP All past medical history, surgical history, allergies, family history, immunizations, medications and social history were updated in the electronic medical record today. All recent labs, ED visits and hospitalizations within the last year were reviewed.  Diabetes:  Pt reports compliance with metformin 1000 mg BID and levemir 50 units BID. SHe denies hypo/hyperglycemia or worsening  neuropathy. She denies non-healing wounds.  She does have neuropathy of toes and hands on occasions. She has tolerating gabapentin 300/400 .  Onset: 2005 PNA series: she reports having completed series. Still awaiting on records to verify.  Flu shot: UTD 2017(recommneded yearly) BMP: normal, follow yearly Foot exam: 01/2016 Eye exam: 08/09/2016 A1c: 7.9 --> repeated today.   Microallb: On ARB  HTN/hyperlipidemia: Pt reports compliance with  amlodopine-valsartan-hctz 10-320-25 today. Blood pressures ranges at home normal. Patient denies chest pain or shortness of breath.   Pt is  prescribed statin (low dose only tolerable).  Health maintenance:  Colonoscopy: completed 06/19/2013, by Dr in [place], 5 year f/u. Diverticula  Mammogram: completed: 02/14/2015, birads 2, benign Cervical cancer screening: last pap: 2014, completed today with HPV. Immunizations: tdap UTD per pt, Influenza UTD 2017 (encouraged yearly), PNA series completed, zostavax 2014, shingrix #1 today.  Infectious disease screening: HIV and  Hep C declined DEXA: none, ordered today.  Assistive device: None Oxygen use: None Patient has a Dental home. Hospitalizations/ED visits:  None Eye exam: 08/09/2016, no retinopathy. Dr. Luvenia Heller Dietles   Depression screen [facility] 2/9 08/13/2016 01/13/2016  Decreased Interest 0 0  Down, Depressed, Hopeless 0 0  PHQ - 2 Score 0 0   No flowsheet data found.   Current Exercise Habits: Structured exercise class, Type of exercise: strength training/weights (PT and [employer]), Time (Minutes): 30, Frequency (Times/Week): 2, Weekly Exercise (Minutes/Week): 60, Intensity: Mild     Immunization History  Administered Date(s) Administered  . Influenza,inj,Quad PF,36+ Mos 01/13/2016  . Zoster 04/09/2012  . Zoster Recombinat (Shingrix) 08/13/2016     Past Medical History:  Diagnosis Date  . Apnea   . Degenerative arthritis   . Diabetic neuropathy ([place]) 2017  . Diverticulosis 06/19/2013   sigmoid colon on colonoscopy  . Hyperlipidemia 2000  . Hypertension, benign 1996  . Lumbar radiculopathy    Severe L4/L5 radiculopathy by old records.   . Tenosynovitis of ankle 2017   post tibial tendon dysfunction stage 5 (PTTD)  . Uncontrolled diabetes mellitus ([place]) 2005   Allergies  Allergen Reactions  . Statins     Lipitor,simvatatin, pravastatin.welchol cause myalgia   Past Surgical History:  Procedure Laterality Date  . EXPLORATORY LAPAROTOMY  1976   endometriosis  . MINOR HEMORRHOIDECTOMY  2006  . OTHER SURGICAL HISTORY Right 1975   venous ligation   Family History  Problem Relation Age of Onset  . COPD Mother   . Diabetes Mellitus II Mother   . Heart failure Mother   . Osteoarthritis Father   . Lung cancer Father 67  . Diabetes Sister   . Diabetes Brother   . Dementia Maternal Grandmother   . Heart disease Maternal Grandmother   .  Heart disease Maternal Grandfather   . Stroke Maternal Grandfather   . Heart failure Maternal Aunt   . Lung cancer Paternal Grandfather   . Lung cancer Paternal Aunt    Social History   Social History  . Marital status: Married    Spouse name: Julie Osborne  . Number of children: 2  . Years of  education: 16   Occupational History  . Pharmacist, hospital   . retired Marine scientist    Social History Main Topics  . Smoking status: Former Smoker    Types: Cigarettes  . Smokeless tobacco: Never Used  . Alcohol use No  . Drug use: No  . Sexual activity: Yes    Partners: Male    Birth control/ protection: None     Comment: Married   Other Topics Concern  . Not on file   Social History Narrative   Married to [employer], 2 children.    BS, RN retired now Printmaker.    Drinks caffeine, uses herbal remedies. Daily vitmain use.   Wears her seatbelt, smoke detector in the home.    exercises routinely.    She feels safe in her relationships.    Allergies as of 08/13/2016      Reactions   Statins    Lipitor,simvatatin, pravastatin.welchol cause myalgia      Medication List       Accurate as of 08/13/16  3:46 PM. Always use your most recent med list.          albuterol 108 (90 Base) MCG/ACT inhaler Commonly known as:  PROVENTIL HFA;VENTOLIN HFA Inhale into the lungs every 6 (six) hours as needed for wheezing or shortness of breath.   Amlodipine-Valsartan-HCTZ 10-320-25 MG Tabs Take 1 tablet by mouth daily.   blood glucose meter kit and supplies Kit Dispense based on patient and insurance preference. Blood glucose x four times daily as directed. DDX:  ICD-9 250.00, 250.01.   diclofenac sodium 1 % Gel Commonly known as:  VOLTAREN Apply 4 g topically 4 (four) times daily. To affected joint.   docusate sodium 100 MG capsule Commonly known as:  COLACE Take 100 mg by mouth 2 (two) times daily.   FIBER-CAPS PO Take by mouth.   gabapentin 100 MG capsule Commonly known as:  NEURONTIN 300 mg in the morning, 400 mg at night.   glucose blood test strip Commonly known as:  ONETOUCH VERIO Test blood sugar before meals and at bedtime   Insulin Detemir 100 UNIT/ML Pen Commonly known as:  LEVEMIR FLEXTOUCH Inject 50 Units into the skin 2 (two) times daily.   metFORMIN 1000 MG tablet Commonly  known as:  GLUCOPHAGE Take 1 tablet (1,000 mg total) by mouth 2 (two) times daily with a meal.   montelukast 10 MG tablet Commonly known as:  SINGULAIR Take 10 mg by mouth at bedtime.   multivitamin capsule Take 1 capsule by mouth daily.   ONETOUCH DELICA LANCETS 88L Misc 1 applicator by Does not apply route 4 (four) times daily -  before meals and at bedtime.   OSTEO BI-FLEX JOINT SHIELD PO Take 1 capsule by mouth daily.   rosuvastatin 5 MG tablet Commonly known as:  CRESTOR Take 1 tablet (5 mg total) by mouth every other day.   tiZANidine 4 MG tablet Commonly known as:  ZANAFLEX Take 4 mg by mouth every 6 (six) hours as needed for muscle spasms.       All past medical history, surgical history, allergies, family history, immunizations andmedications were updated  in the EMR today and reviewed under the history and medication portions of their EMR.     Recent Results (from the past 2160 hour(s))  POCT glycosylated hemoglobin (Hb A1C)     Status: Abnormal   Collection Time: 05/23/16  8:14 AM  Result Value Ref Range   Hemoglobin A1C 7.9   POCT Urinalysis Dipstick (Automated)     Status: Abnormal   Collection Time: 06/25/16 10:34 AM  Result Value Ref Range   Color, UA orange    Clarity, UA slightly cloudy    Glucose, UA negative    Bilirubin, UA negative    Ketones, UA negative    Spec Grav, UA 1.015 1.030 - 1.035   Blood, UA moderate    pH, UA 7.0 5.0 - 8.0   Protein, UA trace    Urobilinogen, UA 0.2 Negative - 2.0   Nitrite, UA positive    Leukocytes, UA small (1+) (A) Negative  Urine Culture     Status: None   Collection Time: 06/25/16 11:10 AM  Result Value Ref Range   Organism ID, Bacteria NO GROWTH     Patient was never admitted.   ROS: 14 pt review of systems performed and negative (unless mentioned in an HPI)  Objective: BP 123/76 (BP Location: Right Arm, Patient Position: Sitting, Cuff Size: Large)   Pulse 74   Temp 97.8 F (36.6 C)   Resp 20    Ht '5\' 11"'  (1.803 m)   Wt 263 lb (119.3 kg)   SpO2 96%   BMI 36.68 kg/m  Gen: Afebrile. No acute distress. Nontoxic in appearance, well-developed, well-nourished,  Obese caucasian female.  HENT: AT. [place]. Bilateral TM visualized and normal in appearance, normal external auditory canal. MMM, no oral lesions, adequate dentition. Bilateral nares within normal limits. Throat without erythema, ulcerations or exudates. no Cough on exam, no hoarseness on exam. Eyes:Pupils Equal Round Reactive to light, Extraocular movements intact,  Conjunctiva without redness, discharge or icterus. Neck/lymp/endocrine: Supple,no lymphadenopathy, no thyromegaly CV: RRR 1/6 SM, no edema, +2/4 P posterior tibialis pulses. no carotid bruits. No JVD. Chest: CTAB, no wheeze, rhonchi or crackles. Normal  Respiratory effort. good Air movement. Abd: Soft. obese. NTND. BS present. no Masses palpated, reducible umbilical hernia. No hepatosplenomegaly. No rebound tenderness or guarding. Skin: no rashes, purpura or petechiae. Warm and well-perfused. Skin intact. Neuro/Msk:  Normal gait. PERLA. EOMi. Alert. Oriented x3.  Cranial nerves II through XII intact. Muscle strength 5/5 upper/lower extremity. DTRs equal bilaterally. Psych: Normal affect, dress and demeanor. Normal speech. Normal thought content and judgment. Breasts: breasts appear normal, symmetrical, no tenderness on exam, no suspicious masses, no skin or nipple changes or axillary nodes. GYN:  External genitalia within normal limits, normal hair distribution, no lesions. Urethral meatus normal, no lesions. Vaginal mucosa pink, moist, normal rugae, no lesions. No cystocele or rectocele. cervix without lesions, friable., no discharge. Bimanual exam revealed normal uterus.  No bladder/suprapubic fullness, masses or tenderness. No cervical motion tenderness. No adnexal fullness. Anus and perineum within normal limits, no lesions.  No exam data present  Assessment/plan: Julie Osborne is a 66 y.o. female present for CPE with PAP. Hypertension, benign/hyperlipidemia Stable today. - consider ASA - continue statin use. Lipids ordered.  - CBC w/Diff; Future - Comp Met (CMET); Future - f/u per routine screenings with DM.   Other hyperlipidemia/BMI 36.0-36.9,adult - continue dietary modifications and exercise > 150 minutes a week.  - Lipid panel; Future - CBC w/Diff; Future - Comp Met (  CMET); Future - HgB A1c; Future Microalbuminuria - Urine Microalbumin w/creat. ratio Pap smear for cervical cancer screening - Cytology - PAP with HPV Immunization due - Varicella-zoster vaccine IM (Shingrix) Estrogen deficiency - DG Bone Density; Future Breast cancer screening - MM Digital Screening; Future Encounter for preventive health maintenance exam:  Patient was encouraged to exercise greater than 150 minutes a week. Patient was encouraged to choose a diet filled with fresh fruits and vegetables, and lean meats. AVS provided to patient today for education/recommendation on gender specific health and safety maintenance.  Return in about 1 year (around 08/13/2017) for CPE. 4 months for HTN/DM/HLD  Electronically signed by: [name], DO [facility]

## 2016-08-14 ENCOUNTER — Telehealth: Payer: Self-pay | Admitting: Family Medicine

## 2016-08-14 LAB — COMPREHENSIVE METABOLIC PANEL
ALT: 14 U/L (ref 6–29)
AST: 14 U/L (ref 10–35)
Albumin: 3.9 g/dL (ref 3.6–5.1)
Alkaline Phosphatase: 64 U/L (ref 33–130)
BILIRUBIN TOTAL: 0.6 mg/dL (ref 0.2–1.2)
BUN: 10 mg/dL (ref 7–25)
CHLORIDE: 98 mmol/L (ref 98–110)
CO2: 31 mmol/L (ref 20–31)
CREATININE: 0.57 mg/dL (ref 0.50–0.99)
Calcium: 9 mg/dL (ref 8.6–10.4)
Glucose, Bld: 109 mg/dL — ABNORMAL HIGH (ref 65–99)
Potassium: 3.8 mmol/L (ref 3.5–5.3)
SODIUM: 135 mmol/L (ref 135–146)
TOTAL PROTEIN: 6.3 g/dL (ref 6.1–8.1)

## 2016-08-14 LAB — CBC WITH DIFFERENTIAL/PLATELET
BASOS ABS: 0 {cells}/uL (ref 0–200)
Basophils Relative: 0 %
EOS PCT: 1 %
Eosinophils Absolute: 78 cells/uL (ref 15–500)
HCT: 38.3 % (ref 35.0–45.0)
Hemoglobin: 12.7 g/dL (ref 11.7–15.5)
Lymphocytes Relative: 24 %
Lymphs Abs: 1872 cells/uL (ref 850–3900)
MCH: 29.8 pg (ref 27.0–33.0)
MCHC: 33.2 g/dL (ref 32.0–36.0)
MCV: 89.9 fL (ref 80.0–100.0)
MONOS PCT: 7 %
MPV: 10.5 fL (ref 7.5–12.5)
Monocytes Absolute: 546 cells/uL (ref 200–950)
NEUTROS ABS: 5304 {cells}/uL (ref 1500–7800)
Neutrophils Relative %: 68 %
PLATELETS: 264 10*3/uL (ref 140–400)
RBC: 4.26 MIL/uL (ref 3.80–5.10)
RDW: 13.7 % (ref 11.0–15.0)
WBC: 7.8 10*3/uL (ref 3.8–10.8)

## 2016-08-14 LAB — CYTOLOGY - PAP
Diagnosis: NEGATIVE
HPV: NOT DETECTED

## 2016-08-14 LAB — LIPID PANEL
CHOLESTEROL: 199 mg/dL (ref <200)
HDL: 48 mg/dL — ABNORMAL LOW (ref >50)
LDL CALC: 116 mg/dL — AB (ref <100)
TRIGLYCERIDES: 176 mg/dL — AB (ref <150)
Total CHOL/HDL Ratio: 4.1 Ratio (ref <5.0)
VLDL: 35 mg/dL — ABNORMAL HIGH (ref <30)

## 2016-08-14 NOTE — Telephone Encounter (Signed)
Urine microalbumin is normal.

## 2016-08-14 NOTE — Telephone Encounter (Signed)
Detailed message left on voice mail. Okay per DPR. 

## 2016-08-15 ENCOUNTER — Telehealth: Payer: Self-pay | Admitting: Family Medicine

## 2016-08-15 DIAGNOSIS — IMO0002 Reserved for concepts with insufficient information to code with codable children: Secondary | ICD-10-CM

## 2016-08-15 DIAGNOSIS — Z794 Long term (current) use of insulin: Principal | ICD-10-CM

## 2016-08-15 DIAGNOSIS — E1165 Type 2 diabetes mellitus with hyperglycemia: Principal | ICD-10-CM

## 2016-08-15 DIAGNOSIS — E1142 Type 2 diabetes mellitus with diabetic polyneuropathy: Secondary | ICD-10-CM

## 2016-08-15 LAB — HEMOGLOBIN A1C
HEMOGLOBIN A1C: 7.5 % — AB (ref <5.7)
Mean Plasma Glucose: 169 mg/dL

## 2016-08-15 MED ORDER — SITAGLIP PHOS-METFORMIN HCL ER 100-1000 MG PO TB24: 1.0000 | ORAL_TABLET | Freq: Every day | ORAL | 0 refills | Status: DC

## 2016-08-15 NOTE — Telephone Encounter (Addendum)
Please call pt: - PAP was normal.  - her cholesterol is good, but her triglycerides are mildly elevated above goal. If she could tolerate a fish oil supplement and/or increase exercise it should bring it back to normal range.  - her a1c is a little lower 7.9--> 7.5, still higher than desired. I would like to add another class of medication to help lower her a1c. I have called this is to her pharmacy, but it may need a prior authorization, please explain to patient to call her pharmacy to make sure approved before making the trip in the event we have to switch to a medication on her formulary.  Janumet is the medication I would like her to start if able. It will replace her metformin because it is a combo pill of januvia and metformin. Follow up in 3 -4 months after starting med and make sure she monitors BG fasting and 1 random monitor for any lower than desired sugars, and if experiences to call in so we can instruct her.

## 2016-08-15 NOTE — Telephone Encounter (Signed)
Left message for patient to return call to review lab results and instructions.

## 2016-08-16 NOTE — Telephone Encounter (Signed)
Patient advised of all lab results and instructions.  Julie Osborne is off today, asked patient to contact pharmacy to see what her insurance said about medication and to know that if it needed PA we would work on it as soon as we are able.

## 2016-08-16 NOTE — Telephone Encounter (Signed)
Noted  

## 2016-08-17 ENCOUNTER — Encounter: Payer: BC Managed Care – PPO | Admitting: Family Medicine

## 2016-08-22 ENCOUNTER — Encounter: Payer: BC Managed Care – PPO | Admitting: Family Medicine

## 2016-09-27 ENCOUNTER — Ambulatory Visit
Admission: RE | Admit: 2016-09-27 | Discharge: 2016-09-27 | Disposition: A | Discharge status: Home / Self Care | Payer: Managed Care, Other (non HMO) | Source: Ambulatory Visit | Attending: Family Medicine | Admitting: Family Medicine

## 2016-09-27 DIAGNOSIS — Z1239 Encounter for other screening for malignant neoplasm of breast: Secondary | ICD-10-CM

## 2016-10-03 ENCOUNTER — Other Ambulatory Visit: Payer: Self-pay | Admitting: *Deleted

## 2016-10-03 MED ORDER — INSULIN PEN NEEDLE 31G X 5 MM MISC: 3 refills | Status: DC

## 2016-11-05 ENCOUNTER — Other Ambulatory Visit: Payer: Self-pay

## 2016-11-05 DIAGNOSIS — E1149 Type 2 diabetes mellitus with other diabetic neurological complication: Secondary | ICD-10-CM

## 2016-11-05 MED ORDER — INSULIN DETEMIR 100 UNIT/ML FLEXPEN: 50.0000 [IU] | PEN_INJECTOR | Freq: Two times a day (BID) | SUBCUTANEOUS | 1 refills | Status: DC

## 2016-11-17 ENCOUNTER — Encounter: Payer: Self-pay | Admitting: Family Medicine

## 2016-11-19 ENCOUNTER — Telehealth: Payer: Self-pay | Admitting: Family Medicine

## 2016-11-19 ENCOUNTER — Encounter: Payer: Self-pay | Admitting: *Deleted

## 2016-11-19 DIAGNOSIS — E785 Hyperlipidemia, unspecified: Secondary | ICD-10-CM

## 2016-11-19 MED ORDER — ROSUVASTATIN CALCIUM 5 MG PO TABS: ORAL_TABLET | ORAL | 3 refills | Status: DC

## 2016-11-19 NOTE — Telephone Encounter (Signed)
Corrected prescription to reflect M-F dosing.

## 2016-11-22 ENCOUNTER — Ambulatory Visit: Payer: Self-pay | Admitting: Family Medicine

## 2016-11-26 LAB — LIPID PANEL
CHOLESTEROL: 168 (ref 0–200)
HDL: 51 (ref 35–70)
LDL CALC: 96
LDL/HDL RATIO: 3.3
Triglycerides: 115 (ref 40–160)

## 2016-11-26 LAB — BASIC METABOLIC PANEL: Glucose: 97

## 2016-11-29 ENCOUNTER — Encounter: Payer: Self-pay | Admitting: Family Medicine

## 2016-11-29 ENCOUNTER — Ambulatory Visit (INDEPENDENT_AMBULATORY_CARE_PROVIDER_SITE_OTHER): Payer: Managed Care, Other (non HMO) | Admitting: Family Medicine

## 2016-11-29 ENCOUNTER — Encounter: Payer: Self-pay | Admitting: *Deleted

## 2016-11-29 VITALS — BP 131/80 | HR 60 | Temp 97.5°F | Resp 20 | Ht 71.0 in | Wt 255.8 lb

## 2016-11-29 DIAGNOSIS — Z6836 Body mass index (BMI) 36.0-36.9, adult: Secondary | ICD-10-CM

## 2016-11-29 DIAGNOSIS — Z794 Long term (current) use of insulin: Secondary | ICD-10-CM

## 2016-11-29 DIAGNOSIS — E784 Other hyperlipidemia: Secondary | ICD-10-CM | POA: Diagnosis not present

## 2016-11-29 DIAGNOSIS — E1142 Type 2 diabetes mellitus with diabetic polyneuropathy: Secondary | ICD-10-CM | POA: Diagnosis not present

## 2016-11-29 DIAGNOSIS — E1100 Type 2 diabetes mellitus with hyperosmolarity without nonketotic hyperglycemic-hyperosmolar coma (NKHHC): Secondary | ICD-10-CM

## 2016-11-29 DIAGNOSIS — I10 Essential (primary) hypertension: Secondary | ICD-10-CM

## 2016-11-29 DIAGNOSIS — E7849 Other hyperlipidemia: Secondary | ICD-10-CM

## 2016-11-29 LAB — POCT GLYCOSYLATED HEMOGLOBIN (HGB A1C): Hemoglobin A1C: 6.2

## 2016-11-29 NOTE — Progress Notes (Signed)
Patient ID: Julie Osborne, female  DOB: 1950/04/22, 66 y.o.   MRN: [id number] Patient Care Team    Relationship Specialty Notifications Start End  Ma Hillock, DO PCP - General Family Medicine  01/13/16     Chief Complaint  Patient presents with  . Diabetes    Subjective:  Julie Osborne is a 66 y.o.  Female  present for  Diabetes:  Pt reports compliance with metformin 1000 mg BID and levemir 30 units BID. Patient denies dizziness, hyperglycemic or hypoglycemic events. Patient denies numbness, tingling in the extremities or nonhealing wounds of feet. She does have neuropathy of toes and hands on occasions. She has tolerating gabapentin 300/400 . She has lost 9 pounds by watching her diet more closely. She has been able to reduce the amount of insulin that she has needed. She reports she's trying to conserve the insulin because it is very expensive for her. Onset: 2005 PNA series: she reports having completed series. Still awaiting on records to verify. Repeat after 65 regardless. Flu shot: UTD 2017(recommneded yearly) BMP: normal, follow yearly Foot exam: 01/2016 Eye exam: 08/09/2016 A1c: 7.9--> 7.5 --> 6.2   Microallb: On ARB  HTN/hyperlipidemia: Pt reports compliance with  amlodopine-valsartan-hctz 10-320-25 today. Blood pressures ranges at home normal. Patient denies chest pain, shortness of breath, dizziness or lower extremity edema.    Depression screen [facility] 2/9 08/13/2016 01/13/2016  Decreased Interest 0 0  Down, Depressed, Hopeless 0 0  PHQ - 2 Score 0 0   No flowsheet data found.         Immunization History  Administered Date(s) Administered  . Influenza,inj,Quad PF,6+ Mos 01/13/2016  . Zoster 04/09/2012  . Zoster Recombinat (Shingrix) 08/13/2016     Past Medical History:  Diagnosis Date  . Apnea   . Degenerative arthritis   . Diabetic neuropathy ([place]) 2017  . Diverticulosis 06/19/2013   sigmoid colon on colonoscopy  . Hyperlipidemia 2000  .  Hypertension, benign 1996  . Lumbar radiculopathy    Severe L4/L5 radiculopathy by old records.   . Tenosynovitis of ankle 2017   post tibial tendon dysfunction stage 5 (PTTD)  . Uncontrolled diabetes mellitus ([place]) 2005   Allergies  Allergen Reactions  . Statins     Lipitor,simvatatin, pravastatin.welchol cause myalgia   Past Surgical History:  Procedure Laterality Date  . EXPLORATORY LAPAROTOMY  1976   endometriosis  . MINOR HEMORRHOIDECTOMY  2006  . OTHER SURGICAL HISTORY Right 1975   venous ligation   Family History  Problem Relation Age of Onset  . COPD Mother   . Diabetes Mellitus II Mother   . Heart failure Mother   . Osteoarthritis Father   . Lung cancer Father 89  . Diabetes Sister   . Diabetes Brother   . Dementia Maternal Grandmother   . Heart disease Maternal Grandmother   . Heart disease Maternal Grandfather   . Stroke Maternal Grandfather   . Heart failure Maternal Aunt   . Lung cancer Paternal Grandfather   . Lung cancer Paternal Aunt    Social History   Social History  . Marital status: Married    Spouse name: Octavia Bruckner  . Number of children: 2  . Years of education: 16   Occupational History  . Pharmacist, hospital   . retired Marine scientist    Social History Main Topics  . Smoking status: Former Smoker    Types: Cigarettes  . Smokeless tobacco: Never Used  . Alcohol use No  . Drug use:  No  . Sexual activity: Yes    Partners: Male    Birth control/ protection: None     Comment: Married   Other Topics Concern  . Not on file   Social History Narrative   Married to [employer], 2 children.    BS, RN retired now Printmaker.    Drinks caffeine, uses herbal remedies. Daily vitmain use.   Wears her seatbelt, smoke detector in the home.    exercises routinely.    She feels safe in her relationships.    Allergies as of 11/29/2016      Reactions   Statins    Lipitor,simvatatin, pravastatin.welchol cause myalgia      Medication List       Accurate as of 11/29/16  8:53  AM. Always use your most recent med list.          albuterol 108 (90 Base) MCG/ACT inhaler Commonly known as:  PROVENTIL HFA;VENTOLIN HFA Inhale into the lungs every 6 (six) hours as needed for wheezing or shortness of breath.   Amlodipine-Valsartan-HCTZ 10-320-25 MG Tabs Take 1 tablet by mouth daily.   blood glucose meter kit and supplies Kit Dispense based on patient and insurance preference. Blood glucose x four times daily as directed. DDX:  ICD-9 250.00, 250.01.   diclofenac sodium 1 % Gel Commonly known as:  VOLTAREN Apply 4 g topically 4 (four) times daily. To affected joint.   docusate sodium 100 MG capsule Commonly known as:  COLACE Take 100 mg by mouth 2 (two) times daily.   FIBER-CAPS PO Take by mouth.   gabapentin 100 MG capsule Commonly known as:  NEURONTIN 300 mg in the morning, 400 mg at night.   glucose blood test strip Commonly known as:  ONETOUCH VERIO Test blood sugar before meals and at bedtime   Insulin Detemir 100 UNIT/ML Pen Commonly known as:  LEVEMIR FLEXTOUCH Inject 50 Units into the skin 2 (two) times daily.   Insulin Pen Needle 31G X 5 MM Misc Use as directed   metFORMIN 1000 MG tablet Commonly known as:  GLUCOPHAGE Take 1 tablet (1,000 mg total) by mouth 2 (two) times daily with a meal.   montelukast 10 MG tablet Commonly known as:  SINGULAIR Take 10 mg by mouth at bedtime.   multivitamin capsule Take 1 capsule by mouth daily.   ONETOUCH DELICA LANCETS 93J Misc 1 applicator by Does not apply route 4 (four) times daily -  before meals and at bedtime.   OSTEO BI-FLEX JOINT SHIELD PO Take 1 capsule by mouth daily.   rosuvastatin 5 MG tablet Commonly known as:  CRESTOR 1 tab Monday- Friday.   tiZANidine 4 MG tablet Commonly known as:  ZANAFLEX Take 4 mg by mouth every 6 (six) hours as needed for muscle spasms.            Discharge Care Instructions        Start     Ordered   11/29/16 0000  POCT glycosylated  hemoglobin (Hb A1C)     11/29/16 0839      All past medical history, surgical history, allergies, family history, immunizations andmedications were updated in the EMR today and reviewed under the history and medication portions of their EMR.     No results found for this or any previous visit (from the past 2160 hour(s)).  Patient was never admitted.   ROS: 14 pt review of systems performed and negative (unless mentioned in an HPI)  Objective: BP 131/80 (BP Location:  Right Arm, Patient Position: Sitting, Cuff Size: Large)   Pulse 60   Temp (!) 97.5 F (36.4 C)   Resp 20   Ht 5' 11" (1.803 m)   Wt 255 lb 12 oz (116 kg)   SpO2 100%   BMI 35.67 kg/m  Gen: Afebrile. No acute distress.  HENT: AT. [place].  MMM.  Eyes:Pupils Equal Round Reactive to light, Extraocular movements intact,  Conjunctiva without redness, discharge or icterus. CV: RRR, no edema, +2/4 P posterior tibialis pulses Chest: CTAB, no wheeze or crackles Abd: Soft. Obese. NTND. BS present. No Masses palpated.  Skin: No rashes, purpura or petechiae. Warm, well perfused, intact. Neuro:  Normal gait. PERLA. EOMi. Alert. Oriented x3    No exam data present  Assessment/plan: Julie Osborne is a 66 y.o. female present for  Hypertension, benign/hyperlipidemia/morbid obesity/bmi 36.0 - Stable today. Continue amlodipine-valsartan HCTZ 10-320-25 milligram tabs. Refills provided today. - consider ASA - continue statin use.  Follow-up every 3-4 months.  Uncontrolled type 2 diabetes mellitus with hyperosmolarity without coma, unspecified whether long term insulin use ([place]) - Improved today. A1c 6.3. Patient has lost 9 pounds, is watching her diet and exercise. She has been able to reduce the amount of insulin she takes to 30 units twice a day of Levemir (was 50 u BID).  - Continue diet and exercise regimen. - Continue monitoring fasting blood glucose. - Continue metformin 1000 mg twice a day - Continue Levemir 30 units  twice a day. Patient will monitor her blood sugar closely, and can reduce as needed as she loses weight. She will also check in to her insurance coverage and see if there is a preferred long-acting insulin since the Levemir is expensive for her. I have also asked her to check GLP-1 and DPP 4-I drug classes to see if one of these options would be affordable to her. - Follow-up 3-4 months   No Follow-up on file. 4 months for HTN/DM/HLD  Electronically signed by: [name], DO [place]

## 2016-11-29 NOTE — Patient Instructions (Addendum)
You are doing great with your diabetes and weight loss.  Keep up the good work.   Please find out which long acting insulin is more agreeable with your insurance.   I have refilled your medicine today.   [place] in 3-4 months.

## 2016-12-20 ENCOUNTER — Ambulatory Visit: Payer: Managed Care, Other (non HMO)

## 2016-12-25 ENCOUNTER — Ambulatory Visit (INDEPENDENT_AMBULATORY_CARE_PROVIDER_SITE_OTHER): Payer: Managed Care, Other (non HMO) | Admitting: *Deleted

## 2016-12-25 DIAGNOSIS — Z23 Encounter for immunization: Secondary | ICD-10-CM | POA: Diagnosis not present

## 2016-12-25 NOTE — Progress Notes (Signed)
Patient presents today for Shingrix #2 and influenza vaccine. Patient tolerated injections well.

## 2016-12-26 ENCOUNTER — Other Ambulatory Visit: Payer: Self-pay | Admitting: *Deleted

## 2016-12-26 ENCOUNTER — Encounter: Payer: Self-pay | Admitting: Family Medicine

## 2016-12-26 DIAGNOSIS — E1149 Type 2 diabetes mellitus with other diabetic neurological complication: Secondary | ICD-10-CM

## 2016-12-26 MED ORDER — INSULIN DETEMIR 100 UNIT/ML FLEXPEN: 50.0000 [IU] | PEN_INJECTOR | Freq: Two times a day (BID) | SUBCUTANEOUS | 0 refills | Status: DC

## 2016-12-27 ENCOUNTER — Encounter: Payer: Self-pay | Admitting: Family Medicine

## 2016-12-28 ENCOUNTER — Telehealth: Payer: Self-pay | Admitting: Family Medicine

## 2016-12-28 ENCOUNTER — Encounter: Payer: Self-pay | Admitting: *Deleted

## 2016-12-28 ENCOUNTER — Encounter: Payer: Self-pay | Admitting: Family Medicine

## 2016-12-28 MED ORDER — BASAGLAR KWIKPEN 100 UNIT/ML ~~LOC~~ SOPN: PEN_INJECTOR | SUBCUTANEOUS | 5 refills | Status: DC

## 2016-12-28 NOTE — Telephone Encounter (Signed)
Please call pt: - I sent in her new script for basaglar. I want her to start at 24u same time every night (10 pm etc). Take for 1 week, checking fasting sugars. At the end of the week if fasting sugars are routinely above 110, then add an additional  unit a day until  Fasting  Morning  glucose is routinely ~100.  - The conversion between the two meds is not perfect, and we error on the side of lower dose with titration up, so we prevent hypoglycemia.   Sent in [place] as well, but make sure she seen it/gets message

## 2016-12-28 NOTE — Telephone Encounter (Signed)
Please call pt in reference to her echart message. I would need to know the amount she is currently taking of the levemir in order to guide her on the conversion to the new medication.

## 2016-12-28 NOTE — Telephone Encounter (Signed)
Message inquiry sent in My Chart.

## 2016-12-31 NOTE — Telephone Encounter (Signed)
Left message for patient to check MY Chart for message from Dr Raoul Pitch if she hasnt already seen it.

## 2017-01-10 ENCOUNTER — Other Ambulatory Visit: Payer: Self-pay | Admitting: *Deleted

## 2017-01-10 DIAGNOSIS — E1149 Type 2 diabetes mellitus with other diabetic neurological complication: Secondary | ICD-10-CM

## 2017-01-10 MED ORDER — METFORMIN HCL 1000 MG PO TABS: 1000.0000 mg | ORAL_TABLET | Freq: Two times a day (BID) | ORAL | 1 refills | Status: DC

## 2017-01-24 ENCOUNTER — Encounter: Payer: Self-pay | Admitting: Family Medicine

## 2017-02-26 ENCOUNTER — Ambulatory Visit: Payer: Managed Care, Other (non HMO) | Admitting: Family Medicine

## 2017-03-04 ENCOUNTER — Other Ambulatory Visit: Payer: Self-pay | Admitting: *Deleted

## 2017-03-04 DIAGNOSIS — I10 Essential (primary) hypertension: Secondary | ICD-10-CM

## 2017-03-04 MED ORDER — AMLODIPINE-VALSARTAN-HCTZ 10-320-25 MG PO TABS: 1.0000 | ORAL_TABLET | Freq: Every day | ORAL | 1 refills | Status: DC

## 2017-03-07 ENCOUNTER — Ambulatory Visit (INDEPENDENT_AMBULATORY_CARE_PROVIDER_SITE_OTHER): Payer: Managed Care, Other (non HMO) | Admitting: Family Medicine

## 2017-03-07 ENCOUNTER — Encounter: Payer: Self-pay | Admitting: Family Medicine

## 2017-03-07 VITALS — BP 114/55 | HR 68 | Temp 97.6°F | Resp 20 | Ht 71.0 in | Wt 259.5 lb

## 2017-03-07 DIAGNOSIS — Z0289 Encounter for other administrative examinations: Secondary | ICD-10-CM

## 2017-03-07 DIAGNOSIS — I10 Essential (primary) hypertension: Secondary | ICD-10-CM

## 2017-03-07 DIAGNOSIS — E1140 Type 2 diabetes mellitus with diabetic neuropathy, unspecified: Secondary | ICD-10-CM | POA: Diagnosis not present

## 2017-03-07 DIAGNOSIS — E7849 Other hyperlipidemia: Secondary | ICD-10-CM

## 2017-03-07 DIAGNOSIS — Z794 Long term (current) use of insulin: Secondary | ICD-10-CM

## 2017-03-07 LAB — POCT GLYCOSYLATED HEMOGLOBIN (HGB A1C): Hemoglobin A1C: 6.9

## 2017-03-07 MED ORDER — BASAGLAR KWIKPEN 100 UNIT/ML ~~LOC~~ SOPN: PEN_INJECTOR | SUBCUTANEOUS | 1 refills | Status: DC

## 2017-03-07 NOTE — Patient Instructions (Signed)
Your a1c is 6.9 today. It is a little higher than desired but probably from the tooth infection you had.    Follow up in 3 months.   Please help [place] help you:  We are honored you have chosen [facility] for your Primary Care home. Below you will find basic instructions that you may need to access in the future. Please help [place] help you by reading the instructions, which cover many of the frequent questions we experience.   Prescription refills and request:  -In order to allow more efficient response time, please call your pharmacy for all refills. They will forward the request electronically to [place]. This allows for the quickest possible response. Request left on a nurse line can take longer to refill, since these are checked as time allows between office patients and other phone calls.  - refill request can take up to 3-5 working days to complete.  - If request is sent electronically and request is appropiate, it is usually completed in 1-2 business days.  - all patients will need to be seen routinely for all chronic medical conditions requiring prescription medications (see follow-up below). If you are overdue for follow up on your condition, you will be asked to make an appointment and we will call in enough medication to cover you until your appointment (up to 30 days).  - all controlled substances will require a face to face visit to request/refill.  - if you desire your prescriptions to go through a new pharmacy, and have an active script at original pharmacy, you will need to call your pharmacy and have scripts transferred to new pharmacy. This is completed between the pharmacy locations and not by your provider.    Results: If any images or labs were ordered, it can take up to 1 week to get results depending on the test ordered and the lab/facility running and resulting the test. - Normal or stable results, which do not need further discussion, may be released to your mychart immediately  with attached note to you. A call may not be generated for normal results. Please make certain to sign up for mychart. If you have questions on how to activate your mychart you can call the front office.  - If your results need further discussion, our office will attempt to contact you via phone, and if unable to reach you after 2 attempts, we will release your abnormal result to your mychart with instructions.  - All results will be automatically released in mychart after 1 week.  - Your provider will provide you with explanation and instruction on all relevant material in your results. Please keep in mind, results and labs may appear confusing or abnormal to the untrained eye, but it does not mean they are actually abnormal for you personally. If you have any questions about your results that are not covered, or you desire more detailed explanation than what was provided, you should make an appointment with your provider to do so.   Our office handles many outgoing and incoming calls daily. If we have not contacted you within 1 week about your results, please check your mychart to see if there is a message first and if not, then contact our office.  In helping with this matter, you help decrease call volume, and therefore allow [place] to be able to respond to patients needs more efficiently.   Acute office visits (sick visit):  An acute visit is intended for a new problem and are  scheduled in shorter time slots to allow schedule openings for patients with new problems. This is the appropriate visit to discuss a new problem. In order to provide you with excellent quality medical care with proper time for you to explain your problem, have an exam and receive treatment with instructions, these appointments should be limited to one new problem per visit. If you experience a new problem, in which you desire to be addressed, please make an acute office visit, we save openings on the schedule to accommodate you.  Please do not save your new problem for any other type of visit, let us take care of it properly and quickly for you.   Follow up visits:  Depending on your condition(s) your provider will need to see you routinely in order to provide you with quality care and prescribe medication(s). Most chronic conditions (Example: hypertension, Diabetes, depression/anxiety... etc), require visits a couple times a year. Your provider will instruct you on proper follow up for your personal medical conditions and history. Please make certain to make follow up appointments for your condition as instructed. Failing to do so could result in lapse in your medication treatment/refills. If you request a refill, and are overdue to be seen on a condition, we will always provide you with a 30 day script (once) to allow you time to schedule.    Medicare wellness (well visit): - we have a wonderful Nurse Maudie Mercury), that will meet with you and provide you will yearly medicare wellness visits. These visits should occur yearly (can not be scheduled less than 1 calendar year apart) and cover preventive health, immunizations, advance directives and screenings you are entitled to yearly through your medicare benefits. Do not miss out on your entitled benefits, this is when medicare will pay for these benefits to be ordered for you.  These are strongly encouraged by your provider and is the appropriate type of visit to make certain you are up to date with all preventive health benefits. If you have not had your medicare wellness exam in the last 12 months, please make certain to schedule one by calling the office and schedule your medicare wellness with Maudie Mercury as soon as possible.   Yearly physical (well visit):  - Adults are recommended to be seen yearly for physicals. Check with your insurance and date of your last physical, most insurances require one calendar year between physicals. Physicals include all preventive health topics, screenings,  medical exam and labs that are appropriate for gender/age and history. You may have fasting labs needed at this visit. This is a well visit (not a sick visit), new problems should not be covered during this visit (see acute visit).  - Pediatric patients are seen more frequently when they are younger. Your provider will advise you on well child visit timing that is appropriate for your their age. - This is not a medicare wellness visit. Medicare wellness exams do not have an exam portion to the visit. Some medicare companies allow for a physical, some do not allow a yearly physical. If your medicare allows a yearly physical you can schedule the medicare wellness with our nurse Maudie Mercury and have your physical with your provider after, on the same day. Please check with insurance for your full benefits.   Late Policy/No Shows:  - all new patients should arrive 15-30 minutes earlier than appointment to allow [place] time  to  obtain all personal demographics,  insurance information and for you to complete office paperwork. - All  established patients should arrive 10-15 minutes earlier than appointment time to update all information and be checked in .  - In our best efforts to run on time, if you are late for your appointment you will be asked to either reschedule or if able, we will work you back into the schedule. There will be a wait time to work you back in the schedule,  depending on availability.  - If you are unable to make it to your appointment as scheduled, please call 24 hours ahead of time to allow [place] to fill the time slot with someone else who needs to be seen. If you do not cancel your appointment ahead of time, you may be charged a no show fee.

## 2017-03-07 NOTE — Progress Notes (Signed)
Patient ID: Julie Osborne, female  DOB: 1950-12-24, 66 y.o.   MRN: [id number] Patient Care Team    Relationship Specialty Notifications Start End  Ma Hillock, DO PCP - General Family Medicine  01/13/16     Chief Complaint  Patient presents with  . Diabetes    Subjective:  Julie Osborne is a 66 y.o.  Female  present for  Diabetes/morbid obesity:  Pt reports compliance with metformin 1000 mg BID and Lantus 30 units twice a day. Patient denies dizziness, hyperglycemic or hypoglycemic events. Patient denies numbness, tingling in the extremities or nonhealing wounds of feet.  She does have neuropathy of toes and hands on occasions. She has tolerating gabapentin 300/400.  Onset: 2005 PNA series: she reports having completed series. Repeat after 67 Flu shot: UTD 2017(recommneded yearly) BMP: normal, follow yearly Foot exam: 01/2016 Eye exam: 08/09/2016 A1c: 7.9--> 7.5 --> 6.2 -->  Microallb: On ARB  HTN/hyperlipidemia: Pt reports compliance with  amlodopine-valsartan-hctz 10-320-25 today. Blood pressures ranges at home normal. Patient denies chest pain, shortness of breath, dizziness or lower extremity edema.    Depression screen [facility] 2/9 08/13/2016 01/13/2016  Decreased Interest 0 0  Down, Depressed, Hopeless 0 0  PHQ - 2 Score 0 0   No flowsheet data found.         Immunization History  Administered Date(s) Administered  . Influenza,inj,Quad PF,6+ Mos 01/13/2016, 12/25/2016  . Zoster 04/09/2012  . Zoster Recombinat (Shingrix) 08/13/2016, 12/25/2016     Past Medical History:  Diagnosis Date  . Apnea   . Degenerative arthritis   . Diabetic neuropathy ([place]) 2017  . Diverticulosis 06/19/2013   sigmoid colon on colonoscopy  . Hyperlipidemia 2000  . Hypertension, benign 1996  . Lumbar radiculopathy    Severe L4/L5 radiculopathy by old records.   . Tenosynovitis of ankle 2017   post tibial tendon dysfunction stage 5 (PTTD)  . Uncontrolled diabetes mellitus  ([place]) 2005   Allergies  Allergen Reactions  . Statins     Lipitor,simvatatin, pravastatin.welchol cause myalgia   Past Surgical History:  Procedure Laterality Date  . EXPLORATORY LAPAROTOMY  1976   endometriosis  . MINOR HEMORRHOIDECTOMY  2006  . OTHER SURGICAL HISTORY Right 1975   venous ligation   Family History  Problem Relation Age of Onset  . COPD Mother   . Diabetes Mellitus II Mother   . Heart failure Mother   . Osteoarthritis Father   . Lung cancer Father 35  . Diabetes Sister   . Diabetes Brother   . Dementia Maternal Grandmother   . Heart disease Maternal Grandmother   . Heart disease Maternal Grandfather   . Stroke Maternal Grandfather   . Heart failure Maternal Aunt   . Lung cancer Paternal Grandfather   . Lung cancer Paternal Aunt    Social History   Socioeconomic History  . Marital status: Married    Spouse name: Octavia Bruckner  . Number of children: 2  . Years of education: 46  . Highest education level: Not on file  Social Needs  . Financial resource strain: Not on file  . Food insecurity - worry: Not on file  . Food insecurity - inability: Not on file  . Transportation needs - medical: Not on file  . Transportation needs - non-medical: Not on file  Occupational History  . Occupation: Pharmacist, hospital  . Occupation: retired Marine scientist  Tobacco Use  . Smoking status: Former Smoker    Types: Cigarettes  . Smokeless tobacco: Never  Used  Substance and Sexual Activity  . Alcohol use: No  . Drug use: No  . Sexual activity: Yes    Partners: Male    Birth control/protection: None    Comment: Married  Other Topics Concern  . Not on file  Social History Narrative   Married to [employer], 2 children.    BS, RN retired now Printmaker.    Drinks caffeine, uses herbal remedies. Daily vitmain use.   Wears her seatbelt, smoke detector in the home.    exercises routinely.    She feels safe in her relationships.    Allergies as of 03/07/2017      Reactions   Statins     Lipitor,simvatatin, pravastatin.welchol cause myalgia      Medication List        Accurate as of 03/07/17  1:06 PM. Always use your most recent med list.          albuterol 108 (90 Base) MCG/ACT inhaler Commonly known as:  PROVENTIL HFA;VENTOLIN HFA Inhale into the lungs every 6 (six) hours as needed for wheezing or shortness of breath.   Amlodipine-Valsartan-HCTZ 10-320-25 MG Tabs Take 1 tablet by mouth daily.   BASAGLAR KWIKPEN 100 UNIT/ML Sopn 30 units BID   diclofenac sodium 1 % Gel Commonly known as:  VOLTAREN Apply 4 g topically 4 (four) times daily. To affected joint.   docusate sodium 100 MG capsule Commonly known as:  COLACE Take 100 mg by mouth 2 (two) times daily.   FIBER-CAPS PO Take by mouth.   fluticasone 50 MCG/ACT nasal spray Commonly known as:  FLONASE Place 2 sprays into both nostrils daily.   gabapentin 100 MG capsule Commonly known as:  NEURONTIN 300 mg in the morning, 400 mg at night.   glucose blood test strip Commonly known as:  ONETOUCH VERIO Test blood sugar before meals and at bedtime   Insulin Pen Needle 31G X 5 MM Misc Use as directed   metFORMIN 1000 MG tablet Commonly known as:  GLUCOPHAGE Take 1 tablet (1,000 mg total) by mouth 2 (two) times daily with a meal.   multivitamin capsule Take 1 capsule by mouth daily.   ONETOUCH DELICA LANCETS 73X Misc 1 applicator by Does not apply route 4 (four) times daily -  before meals and at bedtime.   OSTEO BI-FLEX JOINT SHIELD PO Take 1 capsule by mouth daily.   rosuvastatin 5 MG tablet Commonly known as:  CRESTOR 1 tab Monday- Friday.   tiZANidine 4 MG tablet Commonly known as:  ZANAFLEX Take 4 mg by mouth every 6 (six) hours as needed for muscle spasms.       All past medical history, surgical history, allergies, family history, immunizations andmedications were updated in the EMR today and reviewed under the history and medication portions of their EMR.     Recent Results  (from the past 2160 hour(s))  POCT glycosylated hemoglobin (Hb A1C)     Status: Abnormal   Collection Time: 03/07/17 12:49 PM  Result Value Ref Range   Hemoglobin A1C 6.9     Patient was never admitted.   ROS: 14 pt review of systems performed and negative (unless mentioned in an HPI)  Objective: BP (!) 114/55 (BP Location: Right Arm, Patient Position: Sitting, Cuff Size: Large)   Pulse 68   Temp 97.6 F (36.4 C)   Resp 20   Ht 5\' 11"  (1.803 m)   Wt 259 lb 8 oz (117.7 kg)   SpO2 99%  BMI 36.19 kg/m  Gen: Afebrile. No acute distress.  HENT: AT. [place]. Bilateral TM visualized and normal in appearance. MMM. Eyes:Pupils Equal Round Reactive to light, Extraocular movements intact,  Conjunctiva without redness, discharge or icterus. CV: RRR , no edema, +2/4 P posterior tibialis pulses Chest: CTAB, no wheeze or crackles Abd: Soft. Obese. NTND. BS present.  Skin: No rashes, purpura or petechiae.  Neuro:  Normal gait. PERLA. EOMi. Alert. Oriented x3  Psych: Normal affect, dress and demeanor. Normal speech. Normal thought content and judgment.   Assessment/plan: Julie Osborne is a 65 y.o. female present for  Hypertension, benign/hyperlipidemia/morbid obesity/bmi 36.0 - Stable today. Continue amlodipine-valsartan HCTZ 10-320-25 milligram tabs. Refills provided today. - Low-sodium diet - consider ASA - continue statin use.  Follow-up every 3-4 months.  Uncontrolled type 2 diabetes mellitus with hyperosmolarity without coma, unspecified whether long term insulin use (HCC) - A1c increased from 6.3 to 6.9 today. She states that she had a infection in her mouth that cause her to have elevated sugars for a couple weeks. Her fasting blood glucose reported at home is around 120. She reports she is taking Lantus 30 units twice a day (her preference).   - Continue diet and exercise regimen. - Continue monitoring fasting blood glucose. - Continue metformin 1000 mg twice a day - Continue  insulin glargine basaglar kwikpen, refills provided today.  Encounter for completion of form: Patient presented with a form for her to have a standup desk. I did sign for her to have a standup desk, but did not feel it with necessary to fill and how much she can lift which was a question if had asked. If this is required in order for her to get her desk, she will need to be formally evaluated by physical therapy.  Return in about 3 months (around 06/06/2017) for DM.   Electronically signed by: [name], DO [place]

## 2017-03-08 ENCOUNTER — Encounter: Payer: Self-pay | Admitting: Family Medicine

## 2017-03-11 ENCOUNTER — Other Ambulatory Visit: Payer: Self-pay | Admitting: Family Medicine

## 2017-03-11 MED ORDER — ALBUTEROL SULFATE HFA 108 (90 BASE) MCG/ACT IN AERS: 1.0000 | INHALATION_SPRAY | Freq: Four times a day (QID) | RESPIRATORY_TRACT | 0 refills | Status: DC | PRN

## 2017-03-11 NOTE — Telephone Encounter (Signed)
Albuterol pended. She has 3 pharmacy locations in her record. Please delete but 1 local and 1 mail in pharmacy. Marland Kitchen

## 2017-03-12 ENCOUNTER — Telehealth: Payer: Self-pay | Admitting: Family Medicine

## 2017-03-12 ENCOUNTER — Encounter: Payer: Self-pay | Admitting: *Deleted

## 2017-03-12 ENCOUNTER — Other Ambulatory Visit: Payer: Self-pay | Admitting: *Deleted

## 2017-03-12 DIAGNOSIS — Z794 Long term (current) use of insulin: Principal | ICD-10-CM

## 2017-03-12 DIAGNOSIS — E1140 Type 2 diabetes mellitus with diabetic neuropathy, unspecified: Secondary | ICD-10-CM

## 2017-03-12 MED ORDER — GLUCOSE BLOOD VI STRP: ORAL_STRIP | 3 refills | Status: DC

## 2017-03-12 MED ORDER — FLUTICASONE PROPIONATE 50 MCG/ACT NA SUSP
2.0000 | Freq: Every day | NASAL | 11 refills | Status: DC
Start: 2017-03-12 — End: 2021-03-24

## 2017-03-12 MED ORDER — BASAGLAR KWIKPEN 100 UNIT/ML ~~LOC~~ SOPN: PEN_INJECTOR | SUBCUTANEOUS | 1 refills | Status: DC

## 2017-03-12 NOTE — Telephone Encounter (Signed)
Please call pt: I have called in her flonase and changed her script for insulin to reflect 51mL= 15 pen at 5 pens a box= 3 boxes which should be 3 months at 50 units a day.

## 2017-03-12 NOTE — Telephone Encounter (Signed)
Sent patient message with information in My Chart

## 2017-04-03 ENCOUNTER — Other Ambulatory Visit: Payer: Self-pay | Admitting: *Deleted

## 2017-04-03 DIAGNOSIS — E1169 Type 2 diabetes mellitus with other specified complication: Secondary | ICD-10-CM

## 2017-04-03 DIAGNOSIS — E1149 Type 2 diabetes mellitus with other diabetic neurological complication: Secondary | ICD-10-CM

## 2017-04-03 MED ORDER — GABAPENTIN 100 MG PO CAPS: ORAL_CAPSULE | ORAL | 3 refills | Status: DC

## 2017-06-13 ENCOUNTER — Telehealth: Payer: Self-pay | Admitting: Family Medicine

## 2017-06-13 NOTE — Telephone Encounter (Signed)
Copied from [place]. Topic: Quick Communication - See Telephone Encounter >> Jun 13, 2017 12:33 PM Neva Seat wrote: Harmon Dun [phone]  Discuss exams need for pt's diabetic diagnosis

## 2017-06-13 NOTE — Telephone Encounter (Signed)
Dr Raoul Pitch aware of what patient needs for her Diabetes follow up. We have no permission to discuss patients health history with [facility].

## 2017-06-17 ENCOUNTER — Encounter: Payer: Self-pay | Admitting: Family Medicine

## 2017-07-03 ENCOUNTER — Other Ambulatory Visit: Payer: Self-pay | Admitting: *Deleted

## 2017-07-03 DIAGNOSIS — E1169 Type 2 diabetes mellitus with other specified complication: Secondary | ICD-10-CM

## 2017-07-03 DIAGNOSIS — E1149 Type 2 diabetes mellitus with other diabetic neurological complication: Secondary | ICD-10-CM

## 2017-07-03 MED ORDER — GABAPENTIN 100 MG PO CAPS: ORAL_CAPSULE | ORAL | 0 refills | Status: DC

## 2017-07-08 ENCOUNTER — Other Ambulatory Visit: Payer: Self-pay | Admitting: *Deleted

## 2017-07-08 ENCOUNTER — Encounter: Payer: Self-pay | Admitting: *Deleted

## 2017-07-08 DIAGNOSIS — E785 Hyperlipidemia, unspecified: Secondary | ICD-10-CM

## 2017-07-08 DIAGNOSIS — E1149 Type 2 diabetes mellitus with other diabetic neurological complication: Secondary | ICD-10-CM

## 2017-07-08 MED ORDER — METFORMIN HCL 1000 MG PO TABS: 1000.0000 mg | ORAL_TABLET | Freq: Two times a day (BID) | ORAL | 0 refills | Status: DC

## 2017-07-08 MED ORDER — ROSUVASTATIN CALCIUM 5 MG PO TABS: ORAL_TABLET | ORAL | 0 refills | Status: DC

## 2017-07-08 NOTE — Telephone Encounter (Signed)
Sent 30 day supply of metformin and crestor to patient pharmacy. Sent MY Chart message letting patient know she will need an office visit prior to anymore refills.

## 2017-07-29 ENCOUNTER — Other Ambulatory Visit: Payer: Self-pay | Admitting: Family Medicine

## 2017-07-29 DIAGNOSIS — E785 Hyperlipidemia, unspecified: Secondary | ICD-10-CM

## 2017-07-31 ENCOUNTER — Other Ambulatory Visit: Payer: Self-pay

## 2017-07-31 DIAGNOSIS — E1149 Type 2 diabetes mellitus with other diabetic neurological complication: Secondary | ICD-10-CM

## 2017-07-31 MED ORDER — METFORMIN HCL 1000 MG PO TABS: 1000.0000 mg | ORAL_TABLET | Freq: Two times a day (BID) | ORAL | 0 refills | Status: DC

## 2017-08-06 ENCOUNTER — Ambulatory Visit: Payer: Managed Care, Other (non HMO) | Admitting: Family Medicine

## 2017-08-16 ENCOUNTER — Ambulatory Visit: Payer: Managed Care, Other (non HMO) | Admitting: Family Medicine

## 2017-08-16 ENCOUNTER — Encounter: Payer: Self-pay | Admitting: Family Medicine

## 2017-08-16 VITALS — BP 138/83 | HR 76 | Temp 98.0°F | Resp 20 | Ht 71.0 in | Wt 271.6 lb

## 2017-08-16 DIAGNOSIS — E1140 Type 2 diabetes mellitus with diabetic neuropathy, unspecified: Secondary | ICD-10-CM | POA: Diagnosis not present

## 2017-08-16 DIAGNOSIS — E7849 Other hyperlipidemia: Secondary | ICD-10-CM

## 2017-08-16 DIAGNOSIS — Z794 Long term (current) use of insulin: Secondary | ICD-10-CM | POA: Diagnosis not present

## 2017-08-16 DIAGNOSIS — E1149 Type 2 diabetes mellitus with other diabetic neurological complication: Secondary | ICD-10-CM

## 2017-08-16 DIAGNOSIS — I10 Essential (primary) hypertension: Secondary | ICD-10-CM

## 2017-08-16 DIAGNOSIS — E785 Hyperlipidemia, unspecified: Secondary | ICD-10-CM | POA: Diagnosis not present

## 2017-08-16 LAB — BASIC METABOLIC PANEL
BUN: 11 mg/dL (ref 6–23)
CALCIUM: 9.2 mg/dL (ref 8.4–10.5)
CO2: 31 mEq/L (ref 19–32)
Chloride: 98 mEq/L (ref 96–112)
Creatinine, Ser: 0.59 mg/dL (ref 0.40–1.20)
GFR: 108.82 mL/min (ref >60.00)
Glucose, Bld: 155 mg/dL — ABNORMAL HIGH (ref 70–99)
Potassium: 4 mEq/L (ref 3.5–5.1)
SODIUM: 136 meq/L (ref 135–145)

## 2017-08-16 LAB — CBC
HCT: 39.3 % (ref 36.0–46.0)
Hemoglobin: 13.2 g/dL (ref 12.0–15.0)
MCHC: 33.6 g/dL (ref 30.0–36.0)
MCV: 89.8 fl (ref 78.0–100.0)
PLATELETS: 269 10*3/uL (ref 150.0–400.0)
RBC: 4.37 Mil/uL (ref 3.87–5.11)
RDW: 13.8 % (ref 11.5–15.5)
WBC: 6.3 10*3/uL (ref 4.0–10.5)

## 2017-08-16 LAB — TSH: TSH: 1.4 u[IU]/mL (ref 0.35–4.50)

## 2017-08-16 LAB — POCT GLYCOSYLATED HEMOGLOBIN (HGB A1C): HEMOGLOBIN A1C: 7.6

## 2017-08-16 MED ORDER — GABAPENTIN 100 MG PO CAPS: ORAL_CAPSULE | ORAL | 3 refills | Status: DC

## 2017-08-16 MED ORDER — METFORMIN HCL 1000 MG PO TABS: 1000.0000 mg | ORAL_TABLET | Freq: Two times a day (BID) | ORAL | 1 refills | Status: DC

## 2017-08-16 MED ORDER — BASAGLAR KWIKPEN 100 UNIT/ML ~~LOC~~ SOPN: PEN_INJECTOR | SUBCUTANEOUS | 1 refills | Status: DC

## 2017-08-16 MED ORDER — AMLODIPINE-VALSARTAN-HCTZ 10-320-25 MG PO TABS: 1.0000 | ORAL_TABLET | Freq: Every day | ORAL | 1 refills | Status: DC

## 2017-08-16 MED ORDER — ROSUVASTATIN CALCIUM 5 MG PO TABS: ORAL_TABLET | ORAL | 0 refills | Status: DC

## 2017-08-16 NOTE — Patient Instructions (Signed)
Get back on the mediterranean diet. Increase your exercise > 150 minutes a week.  Your a1c was 7.6 today. Goal 6.5. Meds will be refilled by end of the day.    Diabetes Mellitus and Exercise Exercising regularly is important for your overall health, especially when you have diabetes (diabetes mellitus). Exercising is not only about losing weight. It has many health benefits, such as increasing muscle strength and bone density and reducing body fat and stress. This leads to improved fitness, flexibility, and endurance, all of which result in better overall health. Exercise has additional benefits for people with diabetes, including:  Reducing appetite.  Helping to lower and control blood glucose.  Lowering blood pressure.  Helping to control amounts of fatty substances (lipids) in the blood, such as cholesterol and triglycerides.  Helping the body to respond better to insulin (improving insulin sensitivity).  Reducing how much insulin the body needs.  Decreasing the risk for heart disease by: ? Lowering cholesterol and triglyceride levels. ? Increasing the levels of good cholesterol. ? Lowering blood glucose levels.  What is my activity plan? Your health care provider or certified diabetes educator can help you make a plan for the type and frequency of exercise (activity plan) that works for you. Make sure that you:  Do at least 150 minutes of moderate-intensity or vigorous-intensity exercise each week. This could be brisk walking, biking, or water aerobics. ? Do stretching and strength exercises, such as yoga or weightlifting, at least 2 times a week. ? Spread out your activity over at least 3 days of the week.  Get some form of physical activity every day. ? Do not go more than 2 days in a row without some kind of physical activity. ? Avoid being inactive for more than 90 minutes at a time. Take frequent breaks to walk or stretch.  Choose a type of exercise or activity that you  enjoy, and set realistic goals.  Start slowly, and gradually increase the intensity of your exercise over time.  What do I need to know about managing my diabetes?  Check your blood glucose before and after exercising. ? If your blood glucose is higher than 240 mg/dL (13.3 mmol/L) before you exercise, check your urine for ketones. If you have ketones in your urine, do not exercise until your blood glucose returns to normal.  Know the symptoms of low blood glucose (hypoglycemia) and how to treat it. Your risk for hypoglycemia increases during and after exercise. Common symptoms of hypoglycemia can include: ? Hunger. ? Anxiety. ? Sweating and feeling clammy. ? Confusion. ? Dizziness or feeling light-headed. ? Increased heart rate or palpitations. ? Blurry vision. ? Tingling or numbness around the mouth, lips, or tongue. ? Tremors or shakes. ? Irritability.  Keep a rapid-acting carbohydrate snack available before, during, and after exercise to help prevent or treat hypoglycemia.  Avoid injecting insulin into areas of the body that are going to be exercised. For example, avoid injecting insulin into: ? The arms, when playing tennis. ? The legs, when jogging.  Keep records of your exercise habits. Doing this can help you and your health care provider adjust your diabetes management plan as needed. Write down: ? Food that you eat before and after you exercise. ? Blood glucose levels before and after you exercise. ? The type and amount of exercise you have done. ? When your insulin is expected to peak, if you use insulin. Avoid exercising at times when your insulin is peaking.  When  you start a new exercise or activity, work with your health care provider to make sure the activity is safe for you, and to adjust your insulin, medicines, or food intake as needed.  Drink plenty of water while you exercise to prevent dehydration or heat stroke. Drink enough fluid to keep your urine clear or  pale yellow. This information is not intended to replace advice given to you by your health care provider. Make sure you discuss any questions you have with your health care provider. Document Released: 06/16/2003 Document Revised: 10/14/2015 Document Reviewed: 09/05/2015 Elsevier Interactive Patient Education  2018 [employer].

## 2017-08-16 NOTE — Progress Notes (Signed)
Patient ID: Julie Osborne, female  DOB: 08/15/1950, 67 y.o.   MRN: [id number] Patient Care Team    Relationship Specialty Notifications Start End  Ma Hillock, DO PCP - General Family Medicine  01/13/16     Chief Complaint  Patient presents with  . Diabetes  . Hyperlipidemia    Subjective:  Julie Osborne is a 67 y.o.  Female  present for  Diabetes/morbid obesity:  Pt reports compliance  with metformin 1000 mg BID and Lantus 60 units daily. Patient denies dizziness, hyperglycemic or hypoglycemic events. Patient denies numbness, tingling in the extremities or nonhealing wounds of feet.  She does have neuropathy of toes and hands on occasions. She has tolerating gabapentin 300/400.  Onset: 2005 PNA series: she reports having completed series. Repeat after 67 Flu shot: UTD 2017(recommneded yearly) BMP: normal, follow yearly Foot exam: 08/16/2017 Eye exam: 08/09/2017 (just went last few weeks) A1c: 7.9--> 7.5 --> 6.2 --> 7.6 today Microallb: On ARB  HTN/hyperlipidemia: Pt reports compliance with  amlodopine-valsartan-hctz 10-320-25 today. Blood pressures ranges at home normal. Patient denies chest pain, shortness of breath, dizziness or lower extremity edema. She has gained 12 pounds over the last few months. Sedentary and not watching diet.   Depression screen [facility] 2/9 08/16/2017 08/13/2016 01/13/2016  Decreased Interest 0 0 0  Down, Depressed, Hopeless 0 0 0  PHQ - 2 Score 0 0 0   No flowsheet data found.   Immunization History  Administered Date(s) Administered  . Influenza,inj,Quad PF,6+ Mos 01/13/2016, 12/25/2016  . Pneumococcal Conjugate-13 01/07/2006  . Td 01/21/2011  . Zoster 04/09/2012  . Zoster Recombinat (Shingrix) 08/13/2016, 12/25/2016    Past Medical History:  Diagnosis Date  . Apnea   . Degenerative arthritis   . Diabetic neuropathy ([place]) 2017  . Diverticulosis 06/19/2013   sigmoid colon on colonoscopy  . Hyperlipidemia 2000  . Hypertension, benign  1996  . Lumbar radiculopathy    Severe L4/L5 radiculopathy by old records.   . Tenosynovitis of ankle 2017   post tibial tendon dysfunction stage 5 (PTTD)  . Uncontrolled diabetes mellitus ([place]) 2005   Allergies  Allergen Reactions  . Statins     Lipitor,simvatatin, pravastatin.welchol cause myalgia   Past Surgical History:  Procedure Laterality Date  . EXPLORATORY LAPAROTOMY  1976   endometriosis  . MINOR HEMORRHOIDECTOMY  2006  . OTHER SURGICAL HISTORY Right 1975   venous ligation   Family History  Problem Relation Age of Onset  . COPD Mother   . Diabetes Mellitus II Mother   . Heart failure Mother   . Osteoarthritis Father   . Lung cancer Father 66  . Diabetes Sister   . Diabetes Brother   . Dementia Maternal Grandmother   . Heart disease Maternal Grandmother   . Heart disease Maternal Grandfather   . Stroke Maternal Grandfather   . Heart failure Maternal Aunt   . Lung cancer Paternal Grandfather   . Lung cancer Paternal Aunt    Social History   Socioeconomic History  . Marital status: Married    Spouse name: Julie Osborne  . Number of children: 2  . Years of education: 89  . Highest education level: Not on file  Occupational History  . Occupation: Pharmacist, hospital  . Occupation: retired Marine scientist  Social Needs  . Financial resource strain: Not on file  . Food insecurity:    Worry: Not on file    Inability: Not on file  . Transportation needs:    Medical: Not  on file    Non-medical: Not on file  Tobacco Use  . Smoking status: Former Smoker    Types: Cigarettes  . Smokeless tobacco: Never Used  Substance and Sexual Activity  . Alcohol use: No  . Drug use: No  . Sexual activity: Yes    Partners: Male    Birth control/protection: None    Comment: Married  Lifestyle  . Physical activity:    Days per week: Not on file    Minutes per session: Not on file  . Stress: Not on file  Relationships  . Social connections:    Talks on phone: Not on file    Gets together: Not  on file    Attends religious service: Not on file    Active member of club or organization: Not on file    Attends meetings of clubs or organizations: Not on file    Relationship status: Not on file  . Intimate partner violence:    Fear of current or ex partner: Not on file    Emotionally abused: Not on file    Physically abused: Not on file    Forced sexual activity: Not on file  Other Topics Concern  . Not on file  Social History Narrative   Married to [employer], 2 children.    BS, RN retired now Printmaker.    Drinks caffeine, uses herbal remedies. Daily vitmain use.   Wears her seatbelt, smoke detector in the home.    exercises routinely.    She feels safe in her relationships.    Allergies as of 08/16/2017      Reactions   Statins    Lipitor,simvatatin, pravastatin.welchol cause myalgia      Medication List        Accurate as of 08/16/17  1:21 PM. Always use your most recent med list.          albuterol 108 (90 Base) MCG/ACT inhaler Commonly known as:  PROVENTIL HFA;VENTOLIN HFA Inhale 1-2 puffs into the lungs every 6 (six) hours as needed for wheezing or shortness of breath.   amLODIPine-Valsartan-HCTZ 10-320-25 MG Tabs Take 1 tablet by mouth daily.   BASAGLAR KWIKPEN 100 UNIT/ML Sopn 63u daily   diclofenac sodium 1 % Gel Commonly known as:  VOLTAREN Apply 4 g topically 4 (four) times daily. To affected joint.   docusate sodium 100 MG capsule Commonly known as:  COLACE Take 100 mg by mouth 2 (two) times daily.   FIBER-CAPS PO Take by mouth.   fluticasone 50 MCG/ACT nasal spray Commonly known as:  FLONASE Place 2 sprays into both nostrils daily.   gabapentin 100 MG capsule Commonly known as:  NEURONTIN 300 mg in the morning, 400 mg at night.   glucose blood test strip Commonly known as:  ONETOUCH VERIO Test blood sugar before meals and at bedtime   Insulin Pen Needle 31G X 5 MM Misc Use as directed   metFORMIN 1000 MG tablet Commonly known as:   GLUCOPHAGE Take 1 tablet (1,000 mg total) by mouth 2 (two) times daily with a meal.   multivitamin capsule Take 1 capsule by mouth daily.   ONETOUCH DELICA LANCETS 76E Misc 1 applicator by Does not apply route 4 (four) times daily -  before meals and at bedtime.   OSTEO BI-FLEX JOINT SHIELD PO Take 1 capsule by mouth daily.   rosuvastatin 5 MG tablet Commonly known as:  CRESTOR TAKE 1 TABLET BY MOUTH MONDAY-FRIDAY   tiZANidine 4 MG tablet Commonly known  as:  ZANAFLEX Take 4 mg by mouth every 6 (six) hours as needed for muscle spasms.       All past medical history, surgical history, allergies, family history, immunizations andmedications were updated in the EMR today and reviewed under the history and medication portions of their EMR.     Recent Results (from the past 2160 hour(s))  POCT glycosylated hemoglobin (Hb A1C)     Status: Abnormal   Collection Time: 08/16/17  8:37 AM  Result Value Ref Range   Hemoglobin A1C 7.6     Patient was never admitted.   ROS: 14 pt review of systems performed and negative (unless mentioned in an HPI)  Objective: BP 138/83 (BP Location: Left Arm, Patient Position: Sitting, Cuff Size: Large)   Pulse 76   Temp 98 F (36.7 C)   Resp 20   Ht 5\' 11"  (1.803 m)   Wt 271 lb 9.6 oz (123.2 kg)   SpO2 99%   BMI 37.88 kg/m   Gen: Afebrile. No acute distress. Nontoxic in appearance, well developed. Obese.  HENT: AT. [place].. MMM.  Eyes:Pupils Equal Round Reactive to light, Extraocular movements intact,  Conjunctiva without redness, discharge or icterus. Neck/lymp/endocrine: Supple,no lymphadenopathy, no thyromegaly CV: RRR no murmur, no edema, +2/4 P posterior tibialis pulses Chest: CTAB, no wheeze or crackles Abd: Soft. obese. NTND. BS + Neuro:  Normal gait. PERLA. EOMi. Alert. Orientedx3  Psych: Normal affect, dress and demeanor. Normal speech. Normal thought content and judgment.  Foot exam - bilateral normal; no swelling, tenderness or skin  or vascular lesions. Color and temperature is normal. Sensation is intact. Peripheral pulses are palpable. Toenails are normal.   Assessment/plan: Hadar Elgersma is a 67 y.o. female present for  Hypertension, benign/hyperlipidemia/morbid obesity/bmi 36.0 - Stable. Weight gain is an issue.  - . Continue amlodipine-valsartan HCTZ 10-320-25 milligram tabs. Refills provided today. - Low-sodium diet, mediterranean diet - continue statin use.  Follow-up every 3 months.  Uncontrolled type 2 diabetes mellitus with hyperosmolarity without coma, unspecified whether long term insulin use (HCC) - A1c increased from 6.3 to 6.9--> 7.6  today.  - Increase Glargine from 60 QD to 63 QD. Increase 1 unit a day until fasting glucose 110.  - Continue diet and exercise regimen. Start diet - Continue monitoring fasting blood glucose. - Continue metformin 1000 mg twice a day - Continue insulin glargine basaglar kwikpen, refills provided today. - f/u 3 months  Return in about 3 months (around 11/16/2017).   Electronically signed by: [name], DO [place]

## 2017-09-25 ENCOUNTER — Encounter: Payer: Self-pay | Admitting: Family Medicine

## 2017-09-26 ENCOUNTER — Telehealth: Payer: Self-pay | Admitting: Family Medicine

## 2017-09-26 DIAGNOSIS — E1149 Type 2 diabetes mellitus with other diabetic neurological complication: Secondary | ICD-10-CM

## 2017-09-26 NOTE — Telephone Encounter (Signed)
Pt echart message: " I have had mentioned in our last visit that I was changing jobs and so I have new insurance. This insurance does not Scientist, physiological , but does cover Levemir. It also has a limit on the gabapentin 100 mg dose pills. I take 700mg  so 7 a day. That is over their limit. They would allow for gabapentin 300 mg tablets bid. I think [facility] has sent you information on this one.  I have checked and all the other medications are covered with no issues.  Just wanted to let you know that since May visit, FBS have been between 98 and 130, mostly in 100-120 range. I have dropped 15 lbs and think August visit will be much better! :).   Thanks for your help with the above and have a great 4th of July!"   Please inform patient the following information: 1. Did she get the refill I sent at her appt 5/10 before insurance switched? --> So I know how much to refill now and if I need to tell pharmacy to hold med until she request since she would have just picked up new script. 2. How much of the insulin is she taking daily now (she was tapering up from 63u). Remind her Levemir at that dosage  Is usually spilt, so she will need to perform injections BID again (she was on levemir before).

## 2017-09-27 NOTE — Telephone Encounter (Signed)
Spoke with patient she states she has enough Basaglar to get her through until her appt in August then she will need to change over to [place]. She is currently taking 30 units BID. She states she does need the Gabapentin changed to 300mg  tab ordered BID so insurance will cover 90 day supply. She will run out of gabapentin on Monday.

## 2017-09-30 ENCOUNTER — Encounter: Payer: Self-pay | Admitting: *Deleted

## 2017-09-30 MED ORDER — GABAPENTIN 300 MG PO CAPS: 300.0000 mg | ORAL_CAPSULE | Freq: Two times a day (BID) | ORAL | 1 refills | Status: DC

## 2017-09-30 NOTE — Telephone Encounter (Signed)
Gabapentin called in  At new dose.  Made note to change insulin next visit.

## 2017-09-30 NOTE — Telephone Encounter (Signed)
Sent information to patient in MY Chart 

## 2017-10-14 DIAGNOSIS — S52132A Displaced fracture of neck of left radius, initial encounter for closed fracture: Secondary | ICD-10-CM

## 2017-10-14 HISTORY — DX: Displaced fracture of neck of left radius, initial encounter for closed fracture: S52.132A

## 2017-11-18 ENCOUNTER — Ambulatory Visit: Payer: Managed Care, Other (non HMO) | Admitting: Family Medicine

## 2017-11-27 ENCOUNTER — Encounter: Payer: Self-pay | Admitting: Family Medicine

## 2017-11-27 ENCOUNTER — Ambulatory Visit: Payer: 59 | Admitting: Family Medicine

## 2017-11-27 VITALS — BP 116/70 | HR 64 | Resp 16 | Ht 71.0 in | Wt 259.0 lb

## 2017-11-27 DIAGNOSIS — I10 Essential (primary) hypertension: Secondary | ICD-10-CM | POA: Diagnosis not present

## 2017-11-27 DIAGNOSIS — E1149 Type 2 diabetes mellitus with other diabetic neurological complication: Secondary | ICD-10-CM

## 2017-11-27 DIAGNOSIS — E1140 Type 2 diabetes mellitus with diabetic neuropathy, unspecified: Secondary | ICD-10-CM

## 2017-11-27 DIAGNOSIS — E7849 Other hyperlipidemia: Secondary | ICD-10-CM | POA: Diagnosis not present

## 2017-11-27 DIAGNOSIS — B351 Tinea unguium: Secondary | ICD-10-CM

## 2017-11-27 DIAGNOSIS — E785 Hyperlipidemia, unspecified: Secondary | ICD-10-CM

## 2017-11-27 DIAGNOSIS — Z794 Long term (current) use of insulin: Secondary | ICD-10-CM | POA: Diagnosis not present

## 2017-11-27 LAB — COMPREHENSIVE METABOLIC PANEL
ALBUMIN: 4.4 g/dL (ref 3.5–5.2)
ALT: 19 U/L (ref 0–35)
AST: 16 U/L (ref 0–37)
Alkaline Phosphatase: 62 U/L (ref 39–117)
BILIRUBIN TOTAL: 0.7 mg/dL (ref 0.2–1.2)
BUN: 12 mg/dL (ref 6–23)
CALCIUM: 9.8 mg/dL (ref 8.4–10.5)
CHLORIDE: 96 meq/L (ref 96–112)
CO2: 31 mEq/L (ref 19–32)
Creatinine, Ser: 0.62 mg/dL (ref 0.40–1.20)
GFR: 102.68 mL/min (ref >60.00)
Glucose, Bld: 114 mg/dL — ABNORMAL HIGH (ref 70–99)
Potassium: 3.9 mEq/L (ref 3.5–5.1)
Sodium: 134 mEq/L — ABNORMAL LOW (ref 135–145)
Total Protein: 7 g/dL (ref 6.0–8.3)

## 2017-11-27 LAB — LIPID PANEL
CHOL/HDL RATIO: 3
CHOLESTEROL: 190 mg/dL (ref 0–200)
HDL: 58.4 mg/dL (ref >39.00)
LDL CALC: 108 mg/dL — AB (ref 0–99)
NonHDL: 131.36
TRIGLYCERIDES: 115 mg/dL (ref 0.0–149.0)
VLDL: 23 mg/dL (ref 0.0–40.0)

## 2017-11-27 LAB — POCT GLYCOSYLATED HEMOGLOBIN (HGB A1C): Hemoglobin A1C: 6.4 % — AB (ref 4.0–5.6)

## 2017-11-27 MED ORDER — METFORMIN HCL 1000 MG PO TABS: 1000.0000 mg | ORAL_TABLET | Freq: Two times a day (BID) | ORAL | 1 refills | Status: DC

## 2017-11-27 MED ORDER — ROSUVASTATIN CALCIUM 5 MG PO TABS: ORAL_TABLET | ORAL | 0 refills | Status: DC

## 2017-11-27 MED ORDER — AMLODIPINE-VALSARTAN-HCTZ 10-320-25 MG PO TABS: 1.0000 | ORAL_TABLET | Freq: Every day | ORAL | 1 refills | Status: DC

## 2017-11-27 MED ORDER — TERBINAFINE HCL 250 MG PO TABS: 250.0000 mg | ORAL_TABLET | Freq: Every day | ORAL | 0 refills | Status: DC

## 2017-11-27 MED ORDER — BASAGLAR KWIKPEN 100 UNIT/ML ~~LOC~~ SOPN: PEN_INJECTOR | SUBCUTANEOUS | 1 refills | Status: DC

## 2017-11-27 NOTE — Progress Notes (Signed)
Patient ID: Julie Osborne, female  DOB: July 15, 1950, 67 y.o.   MRN: [id number] Patient Care Team    Relationship Specialty Notifications Start End  Ma Hillock, DO PCP - General Family Medicine  01/13/16     Chief Complaint  Patient presents with  . Diabetes    Follow up    Subjective:  Julie Osborne is a 67 y.o.  Female  present for  Diabetes/morbid obesity:  Pt reports compliance with metformin 1000 mg BID and Lantus 60 units daily. Patient denies dizziness, hyperglycemic or hypoglycemic events. Patient denies numbness, tingling in the extremities or nonhealing wounds of feet.  She does have neuropathy of toes and hands on occasions. She has tolerating gabapentin 300/400.  She has lost 12 lbs!!! With dietary changes.  Onset: 2005 PNA series: she reports having completed series. Repeat after 67 Flu shot: UTD 2017(recommneded yearly) BMP: normal, follow yearly Foot exam: 08/16/2017 Eye exam: 08/09/2017 (just went last few weeks) A1c: 7.9--> 7.5 --> 6.2 --> 7.6--> 6.4 today Microallb: On ARB  HTN/hyperlipidemia: Pt reports complaince with  amlodopine-valsartan-hctz 10-320-25 today. Blood pressures ranges at home normal. Patient denies chest pain, shortness of breath, dizziness or lower extremity edema.   Depression screen [facility] 2/9 08/16/2017 08/13/2016 01/13/2016  Decreased Interest 0 0 0  Down, Depressed, Hopeless 0 0 0  PHQ - 2 Score 0 0 0   No flowsheet data found.   Immunization History  Administered Date(s) Administered  . Influenza,inj,Quad PF,6+ Mos 01/13/2016, 12/25/2016  . Pneumococcal Conjugate-13 01/07/2006  . Td 01/21/2011  . Zoster 04/09/2012  . Zoster Recombinat (Shingrix) 08/13/2016, 12/25/2016    Past Medical History:  Diagnosis Date  . Apnea   . Degenerative arthritis   . Diabetic neuropathy ([place]) 2017  . Diverticulosis 06/19/2013   sigmoid colon on colonoscopy  . Hyperlipidemia 2000  . Hypertension, benign 1996  . Lumbar radiculopathy    Severe L4/L5 radiculopathy by old records.   . Tenosynovitis of ankle 2017   post tibial tendon dysfunction stage 5 (PTTD)  . Uncontrolled diabetes mellitus ([place]) 2005   Allergies  Allergen Reactions  . Statins     Lipitor,simvatatin, pravastatin.welchol cause myalgia   Past Surgical History:  Procedure Laterality Date  . EXPLORATORY LAPAROTOMY  1976   endometriosis  . MINOR HEMORRHOIDECTOMY  2006  . OTHER SURGICAL HISTORY Right 1975   venous ligation   Family History  Problem Relation Age of Onset  . COPD Mother   . Diabetes Mellitus II Mother   . Heart failure Mother   . Osteoarthritis Father   . Lung cancer Father 42  . Diabetes Sister   . Diabetes Brother   . Dementia Maternal Grandmother   . Heart disease Maternal Grandmother   . Heart disease Maternal Grandfather   . Stroke Maternal Grandfather   . Heart failure Maternal Aunt   . Lung cancer Paternal Grandfather   . Lung cancer Paternal Aunt    Social History   Socioeconomic History  . Marital status: Married    Spouse name: Octavia Bruckner  . Number of children: 2  . Years of education: 83  . Highest education level: Not on file  Occupational History  . Occupation: Pharmacist, hospital  . Occupation: retired Marine scientist  Social Needs  . Financial resource strain: Not on file  . Food insecurity:    Worry: Not on file    Inability: Not on file  . Transportation needs:    Medical: Not on file  Non-medical: Not on file  Tobacco Use  . Smoking status: Former Smoker    Types: Cigarettes  . Smokeless tobacco: Never Used  Substance and Sexual Activity  . Alcohol use: No  . Drug use: No  . Sexual activity: Yes    Partners: Male    Birth control/protection: None    Comment: Married  Lifestyle  . Physical activity:    Days per week: Not on file    Minutes per session: Not on file  . Stress: Not on file  Relationships  . Social connections:    Talks on phone: Not on file    Gets together: Not on file    Attends religious  service: Not on file    Active member of club or organization: Not on file    Attends meetings of clubs or organizations: Not on file    Relationship status: Not on file  . Intimate partner violence:    Fear of current or ex partner: Not on file    Emotionally abused: Not on file    Physically abused: Not on file    Forced sexual activity: Not on file  Other Topics Concern  . Not on file  Social History Narrative   Married to Tim, 2 children.    BS, RN retired now teaching.    Drinks caffeine, uses herbal remedies. Daily vitmain use.   Wears her seatbelt, smoke detector in the home.    exercises routinely.    She feels safe in her relationships.    Allergies as of 11/27/2017      Reactions   Statins    Lipitor,simvatatin, pravastatin.welchol cause myalgia      Medication List        Accurate as of 11/27/17 11:59 PM. Always use your most recent med list.          albuterol 108 (90 Base) MCG/ACT inhaler Commonly known as:  PROVENTIL HFA;VENTOLIN HFA Inhale 1-2 puffs into the lungs every 6 (six) hours as needed for wheezing or shortness of breath.   amLODIPine-Valsartan-HCTZ 10-320-25 MG Tabs Take 1 tablet by mouth daily.   BASAGLAR KWIKPEN 100 UNIT/ML Sopn 60u daily   diclofenac sodium 1 % Gel Commonly known as:  VOLTAREN Apply 4 g topically 4 (four) times daily. To affected joint.   docusate sodium 100 MG capsule Commonly known as:  COLACE Take 100 mg by mouth 2 (two) times daily.   FIBER-CAPS PO Take by mouth.   fluticasone 50 MCG/ACT nasal spray Commonly known as:  FLONASE Place 2 sprays into both nostrils daily.   gabapentin 300 MG capsule Commonly known as:  NEURONTIN Take 1 capsule (300 mg total) by mouth 2 (two) times daily.   glucose blood test strip Test blood sugar before meals and at bedtime   Insulin Pen Needle 31G X 5 MM Misc Use as directed   metFORMIN 1000 MG tablet Commonly known as:  GLUCOPHAGE Take 1 tablet (1,000 mg total) by mouth  2 (two) times daily with a meal.   multivitamin capsule Take 1 capsule by mouth daily.   naproxen 500 MG tablet Commonly known as:  NAPROSYN naproxen 500 mg tablet   ONETOUCH DELICA LANCETS 33G Misc 1 applicator by Does not apply route 4 (four) times daily -  before meals and at bedtime.   OSTEO BI-FLEX JOINT SHIELD PO Take 1 capsule by mouth daily.   rosuvastatin 5 MG tablet Commonly known as:  CRESTOR TAKE 1 TABLET BY MOUTH MONDAY-FRIDAY     terbinafine 250 MG tablet Commonly known as:  LAMISIL Take 1 tablet (250 mg total) by mouth daily.   tiZANidine 4 MG tablet Commonly known as:  ZANAFLEX Take 4 mg by mouth every 6 (six) hours as needed for muscle spasms.       All past medical history, surgical history, allergies, family history, immunizations andmedications were updated in the EMR today and reviewed under the history and medication portions of their EMR.     Recent Results (from the past 2160 hour(s))  POCT glycosylated hemoglobin (Hb A1C)     Status: Abnormal   Collection Time: 11/27/17  8:43 AM  Result Value Ref Range   Hemoglobin A1C 6.4 (A) 4.0 - 5.6 %   HbA1c POC (<> result, manual entry)     HbA1c, POC (prediabetic range)     HbA1c, POC (controlled diabetic range)    Lipid panel     Status: Abnormal   Collection Time: 11/27/17  9:09 AM  Result Value Ref Range   Cholesterol 190 0 - 200 mg/dL    Comment: ATP III Classification       Desirable:  < 200 mg/dL               Borderline High:  200 - 239 mg/dL          High:  > = 240 mg/dL   Triglycerides 115.0 0.0 - 149.0 mg/dL    Comment: Normal:  <150 mg/dLBorderline High:  150 - 199 mg/dL   HDL 58.40 >39.00 mg/dL   VLDL 23.0 0.0 - 40.0 mg/dL   LDL Cholesterol 108 (H) 0 - 99 mg/dL   Total CHOL/HDL Ratio 3     Comment:                Men          Women1/2 Average Risk     3.4          3.3Average Risk          5.0          4.42X Average Risk          9.6          7.13X Average Risk          15.0          11.0                        NonHDL 131.36     Comment: NOTE:  Non-HDL goal should be 30 mg/dL higher than patient's LDL goal (i.e. LDL goal of < 70 mg/dL, would have non-HDL goal of < 100 mg/dL)  Comp Met (CMET)     Status: Abnormal   Collection Time: 11/27/17  9:09 AM  Result Value Ref Range   Sodium 134 (L) 135 - 145 mEq/L   Potassium 3.9 3.5 - 5.1 mEq/L   Chloride 96 96 - 112 mEq/L   CO2 31 19 - 32 mEq/L   Glucose, Bld 114 (H) 70 - 99 mg/dL   BUN 12 6 - 23 mg/dL   Creatinine, Ser 0.62 0.40 - 1.20 mg/dL   Total Bilirubin 0.7 0.2 - 1.2 mg/dL   Alkaline Phosphatase 62 39 - 117 U/L   AST 16 0 - 37 U/L   ALT 19 0 - 35 U/L   Total Protein 7.0 6.0 - 8.3 g/dL   Albumin 4.4 3.5 - 5.2 g/dL   Calcium 9.8 8.4 - 10.5 mg/dL  GFR 102.68 >60.00 mL/min    Patient was never admitted.   ROS: 14 pt review of systems performed and negative (unless mentioned in an HPI)  Objective: BP 116/70 (BP Location: Right Arm, Patient Position: Sitting, Cuff Size: Normal)   Pulse 64   Resp 16   Ht 5' 11" (1.803 m)   Wt 259 lb (117.5 kg)   SpO2 98%   BMI 36.12 kg/m   Gen: Afebrile. No acute distress. Nontoxic, well developed, obese, caucasian female.  HENT: AT. [place].  MMM.  Eyes:Pupils Equal Round Reactive to light, Extraocular movements intact,  Conjunctiva without redness, discharge or icterus. Neck/lymp/endocrine: Supple,no lymphadenopathy, no thyromegaly CV: RRR no murmur, no edema, +2/4 P posterior tibialis pulses Chest: CTAB, no wheeze or crackles Abd: Soft. obese. NTND. BS present. no Masses palpated.  Skin: no rashes, purpura or petechiae.  Neuro:  Normal gait. PERLA. EOMi. Alert. Oriented. Psych: Normal affect, dress and demeanor. Normal speech. Normal thought content and judgment. Diabetic Foot Exam - Simple   Simple Foot Form Diabetic Foot exam was performed with the following findings:  Yes 11/27/2017 12:57 PM  Visual Inspection No deformities, no ulcerations, no other skin breakdown bilaterally:   Yes Sensation Testing Intact to touch and monofilament testing bilaterally:  Yes Pulse Check Posterior Tibialis and Dorsalis pulse intact bilaterally:  Yes Comments thickened yellow toe nails bilateral 1-2nd toes.     Assessment/plan: Jenin Birdsall is a 67 y.o. female present for  Hypertension, benign/hyperlipidemia/morbid obesity/bmi 36.0 - stable. Continue/refilled Amlodipine-valsartan-HCTZ - Low-sodium diet, mediterranean diet. Losing weight!! - continue statin use.  Follow-up every 3-4 months.  Uncontrolled type 2 diabetes mellitus with hyperosmolarity without coma, unspecified whether long term insulin use ([place]) - doing well. A1c improved and she is losing weight.  - A1c  6.3 to 6.9--> 7.6--> 6.4  today.  - Continue diet and exercise regimen. Losing weight again!!! - Continue monitoring fasting blood glucose  - Continue metformin 1000 mg twice a day; refills provided today - Continue insulin glargine basaglar kwikpen (~60 units a day), refills provided today. PNA series: she reports having completed series. Start repeat series after 67 next visit.  Flu shot: UTD due this year--> she will get next visit w/high dose(recommneded yearly) BMP: normal, follow yearly Foot exam: 08/16/2017 Eye exam: 08/09/2017 A1c: 7.9--> 7.5 --> 6.2 --> 7.6--> 6.4 today Microallb: On ARB - f/u 3-4 months  Onychomycosis: - discussed tx. She has seen podiatry but it has worsened since. She tried OTC creams without great benefit.  - LFT normal baseline.  - Discussed hygiene and continue OTC cream and well cleansing all shoes now and after tx. Also using Lamisil spray on shoes.  - Terbinafine 250 mg QD x 12 weeks prescribed. Recheck of LFT after 80moof med and after tx encouraged.    Return in about 3 months (around 02/27/2018) for dm/htn/lft. > 25 minutes spent with patient, >50% of time spent face to face counseling    Electronically signed by: RHoward Pouch DMcColl

## 2017-11-27 NOTE — Patient Instructions (Signed)
I will call in the antifungal for you.  We will call you with results of labs.  Pick up the cream and spray (lamisil) and treat foot and shoes as discussed.   I have refilled your meds for you.  F/U 3 mos. On [facility]

## 2017-11-28 ENCOUNTER — Encounter: Payer: Self-pay | Admitting: Family Medicine

## 2017-11-28 ENCOUNTER — Telehealth: Payer: Self-pay | Admitting: Family Medicine

## 2017-11-28 DIAGNOSIS — Z79899 Other long term (current) drug therapy: Secondary | ICD-10-CM

## 2017-11-28 NOTE — Telephone Encounter (Signed)
Please inform pt I have called in the pill for toenails. She will need to make a lab appt only in 4-5 weeks to have liver enzymes checked and then we will rpeat it agin when we see each other in 3-4 months. - order placed for future.

## 2017-11-28 NOTE — Telephone Encounter (Signed)
Detailed message left on voice mail. Patient instructed on calling back to schedule lab appointment.

## 2018-01-01 ENCOUNTER — Other Ambulatory Visit (INDEPENDENT_AMBULATORY_CARE_PROVIDER_SITE_OTHER): Payer: 59

## 2018-01-01 DIAGNOSIS — Z79899 Other long term (current) drug therapy: Secondary | ICD-10-CM

## 2018-01-01 LAB — HEPATIC FUNCTION PANEL
ALT: 17 U/L (ref 0–35)
AST: 13 U/L (ref 0–37)
Albumin: 4.2 g/dL (ref 3.5–5.2)
Alkaline Phosphatase: 59 U/L (ref 39–117)
BILIRUBIN TOTAL: 0.5 mg/dL (ref 0.2–1.2)
Bilirubin, Direct: 0.1 mg/dL (ref 0.0–0.3)
Total Protein: 6.7 g/dL (ref 6.0–8.3)

## 2018-01-08 ENCOUNTER — Other Ambulatory Visit: Payer: Self-pay | Admitting: *Deleted

## 2018-01-08 DIAGNOSIS — Z79899 Other long term (current) drug therapy: Secondary | ICD-10-CM

## 2018-01-08 NOTE — Progress Notes (Signed)
LFT orders placed as directed.

## 2018-02-05 ENCOUNTER — Other Ambulatory Visit (INDEPENDENT_AMBULATORY_CARE_PROVIDER_SITE_OTHER): Payer: 59

## 2018-02-05 DIAGNOSIS — Z79899 Other long term (current) drug therapy: Secondary | ICD-10-CM

## 2018-02-05 LAB — HEPATIC FUNCTION PANEL
ALK PHOS: 55 U/L (ref 39–117)
ALT: 15 U/L (ref 0–35)
AST: 14 U/L (ref 0–37)
Albumin: 4.3 g/dL (ref 3.5–5.2)
BILIRUBIN TOTAL: 0.5 mg/dL (ref 0.2–1.2)
Bilirubin, Direct: 0.1 mg/dL (ref 0.0–0.3)
Total Protein: 6.7 g/dL (ref 6.0–8.3)

## 2018-02-07 ENCOUNTER — Ambulatory Visit: Payer: 59 | Admitting: Family Medicine

## 2018-02-07 DIAGNOSIS — Z0289 Encounter for other administrative examinations: Secondary | ICD-10-CM

## 2018-02-20 ENCOUNTER — Other Ambulatory Visit: Payer: Self-pay

## 2018-02-20 DIAGNOSIS — B351 Tinea unguium: Secondary | ICD-10-CM

## 2018-02-26 ENCOUNTER — Encounter: Payer: Self-pay | Admitting: Family Medicine

## 2018-02-26 ENCOUNTER — Ambulatory Visit (INDEPENDENT_AMBULATORY_CARE_PROVIDER_SITE_OTHER): Payer: 59 | Admitting: Family Medicine

## 2018-02-26 VITALS — BP 138/84 | HR 72 | Temp 97.6°F | Resp 20 | Ht 71.0 in | Wt 259.6 lb

## 2018-02-26 DIAGNOSIS — I10 Essential (primary) hypertension: Secondary | ICD-10-CM | POA: Diagnosis not present

## 2018-02-26 DIAGNOSIS — E785 Hyperlipidemia, unspecified: Secondary | ICD-10-CM | POA: Diagnosis not present

## 2018-02-26 DIAGNOSIS — Z0000 Encounter for general adult medical examination without abnormal findings: Secondary | ICD-10-CM | POA: Diagnosis not present

## 2018-02-26 DIAGNOSIS — E1149 Type 2 diabetes mellitus with other diabetic neurological complication: Secondary | ICD-10-CM | POA: Diagnosis not present

## 2018-02-26 DIAGNOSIS — Z23 Encounter for immunization: Secondary | ICD-10-CM

## 2018-02-26 DIAGNOSIS — Z1211 Encounter for screening for malignant neoplasm of colon: Secondary | ICD-10-CM

## 2018-02-26 DIAGNOSIS — E2839 Other primary ovarian failure: Secondary | ICD-10-CM

## 2018-02-26 DIAGNOSIS — Z1239 Encounter for other screening for malignant neoplasm of breast: Secondary | ICD-10-CM | POA: Diagnosis not present

## 2018-02-26 LAB — CBC WITH DIFFERENTIAL/PLATELET
BASOS PCT: 0.5 % (ref 0.0–3.0)
Basophils Absolute: 0 10*3/uL (ref 0.0–0.1)
EOS PCT: 1.1 % (ref 0.0–5.0)
Eosinophils Absolute: 0.1 10*3/uL (ref 0.0–0.7)
HEMATOCRIT: 40.4 % (ref 36.0–46.0)
Hemoglobin: 13.5 g/dL (ref 12.0–15.0)
LYMPHS ABS: 2.5 10*3/uL (ref 0.7–4.0)
Lymphocytes Relative: 41.4 % (ref 12.0–46.0)
MCHC: 33.6 g/dL (ref 30.0–36.0)
MCV: 89.8 fl (ref 78.0–100.0)
MONO ABS: 0.5 10*3/uL (ref 0.1–1.0)
MONOS PCT: 8.7 % (ref 3.0–12.0)
NEUTROS PCT: 48.3 % (ref 43.0–77.0)
Neutro Abs: 3 10*3/uL (ref 1.4–7.7)
PLATELETS: 266 10*3/uL (ref 150.0–400.0)
RBC: 4.49 Mil/uL (ref 3.87–5.11)
RDW: 13.6 % (ref 11.5–15.5)
WBC: 6.2 10*3/uL (ref 4.0–10.5)

## 2018-02-26 LAB — COMPREHENSIVE METABOLIC PANEL
ALT: 16 U/L (ref 0–35)
AST: 14 U/L (ref 0–37)
Albumin: 4.3 g/dL (ref 3.5–5.2)
Alkaline Phosphatase: 58 U/L (ref 39–117)
BILIRUBIN TOTAL: 0.5 mg/dL (ref 0.2–1.2)
BUN: 13 mg/dL (ref 6–23)
CALCIUM: 9.6 mg/dL (ref 8.4–10.5)
CHLORIDE: 96 meq/L (ref 96–112)
CO2: 31 meq/L (ref 19–32)
Creatinine, Ser: 0.57 mg/dL (ref 0.40–1.20)
GFR: 113.05 mL/min (ref >60.00)
GLUCOSE: 127 mg/dL — AB (ref 70–99)
POTASSIUM: 4 meq/L (ref 3.5–5.1)
Sodium: 134 mEq/L — ABNORMAL LOW (ref 135–145)
Total Protein: 6.6 g/dL (ref 6.0–8.3)

## 2018-02-26 LAB — LIPID PANEL
CHOL/HDL RATIO: 4
Cholesterol: 213 mg/dL — ABNORMAL HIGH (ref 0–200)
HDL: 57.9 mg/dL (ref >39.00)
LDL Cholesterol: 133 mg/dL — ABNORMAL HIGH (ref 0–99)
NONHDL: 155.25
TRIGLYCERIDES: 111 mg/dL (ref 0.0–149.0)
VLDL: 22.2 mg/dL (ref 0.0–40.0)

## 2018-02-26 LAB — TSH: TSH: 1.27 u[IU]/mL (ref 0.35–4.50)

## 2018-02-26 LAB — HEMOGLOBIN A1C: Hgb A1c MFr Bld: 6.8 % — ABNORMAL HIGH (ref 4.6–6.5)

## 2018-02-26 MED ORDER — METFORMIN HCL 1000 MG PO TABS: 1000.0000 mg | ORAL_TABLET | Freq: Two times a day (BID) | ORAL | 1 refills | Status: DC

## 2018-02-26 MED ORDER — AMLODIPINE-VALSARTAN-HCTZ 10-320-25 MG PO TABS: 1.0000 | ORAL_TABLET | Freq: Every day | ORAL | 1 refills | Status: DC

## 2018-02-26 MED ORDER — GABAPENTIN 300 MG PO CAPS: 300.0000 mg | ORAL_CAPSULE | Freq: Two times a day (BID) | ORAL | 1 refills | Status: DC

## 2018-02-26 MED ORDER — INSULIN DETEMIR 100 UNIT/ML FLEXPEN: 30.0000 [IU] | PEN_INJECTOR | Freq: Every day | SUBCUTANEOUS | 1 refills | Status: DC

## 2018-02-26 NOTE — Progress Notes (Signed)
Patient ID: Julie Osborne, female  DOB: February 02, 1951, 67 y.o.   MRN: [id number] Patient Care Team    Relationship Specialty Notifications Start End  Ma Hillock, DO PCP - General Family Medicine  01/13/16     Chief Complaint  Patient presents with  . Annual Exam    Subjective:  Julie Osborne is a 67 y.o.  Female  present for CPE. All past medical history, surgical history, allergies, family history, immunizations, medications and social history were updated in the electronic medical record today. All recent labs, ED visits and hospitalizations within the last year were reviewed.  Diabetes/morbid obesity:  Pt reports compliance with metformin 1000 mg BID and Lantus 60 units daily. Her insurance is requiring her to switch back to Levemir.  Patient denies dizziness, hyperglycemic or hypoglycemic events. Patient denies numbness, tingling in the extremities or nonhealing wounds of feet.  She does have neuropathy of toes and hands on occasions. She has tolerating gabapentin 300/400. She finished her terbinafine 3 month treatment.  Onset: 2005 PNA series: restarted series today for > 65. PSV23 provided, prevnar 1 year 02/2019 Flu shot: UTD 2019(recommneded yearly) BMP: normal, follow yearly Foot exam: 08/16/2017 Eye exam: 08/09/2017 (just went last few weeks) A1c: 7.9--> 7.5 --> 6.2 --> 7.6--> 6.4 --> collected today Microallb: On ARB  HTN/hyperlipidemia: Pt reports compliance with amlodopine-valsartan-hctz 10-320-25 today. Blood pressures ranges at home normal. Patient denies chest pain, shortness of breath, dizziness or lower extremity edema.    Health maintenance: updated 02/26/18 Colonoscopy: completed 06/19/2013, by Dr in [place], 5 year f/u. Diverticula -- referral placed today Mammogram: completed: 09/27/2016, birads 2, benign- breast center- ordered placed today Cervical cancer screening: last pap: 08/13/2016, normal with neg HPV. No further testing > 65 Immunizations: td  2012, Influenza UTD 2017 (encouraged yearly), zostavax 2014, shingrix completed, PSV23 today (>65) Infectious disease screening: HIV and  Hep C declined DEXA: none, re-ordered today.  Assistive device: None Oxygen use: None Patient has a Dental home. Hospitalizations/ED visits: None Eye exam: 08/2017, no retinopathy. Dr. Luvenia Heller Dietles--> requested records AGAIN Assistive device: none Oxygen VQX:IHWT Patient has a Dental home. Hospitalizations/ED visits: reviewed   Depression screen [facility] 2/9 02/26/2018 08/16/2017 08/13/2016 01/13/2016  Decreased Interest 0 0 0 0  Down, Depressed, Hopeless 0 0 0 0  PHQ - 2 Score 0 0 0 0   No flowsheet data found.   Current Exercise Habits: The patient does not participate in regular exercise at present Exercise limited by: None identified   Immunization History  Administered Date(s) Administered  . Influenza,inj,Quad PF,6+ Mos 01/13/2016, 12/25/2016  . Pneumococcal Conjugate-13 01/07/2006  . Pneumococcal Polysaccharide-23 02/26/2018  . Td 01/21/2011  . Zoster 04/09/2012  . Zoster Recombinat (Shingrix) 08/13/2016, 12/25/2016     Past Medical History:  Diagnosis Date  . Apnea   . Degenerative arthritis   . Diabetic neuropathy ([place]) 2017  . Diverticulosis 06/19/2013   sigmoid colon on colonoscopy  . Hyperlipidemia 2000  . Hypertension, benign 1996  . Lumbar radiculopathy    Severe L4/L5 radiculopathy by old records.   . Tenosynovitis of ankle 2017   post tibial tendon dysfunction stage 5 (PTTD)  . Uncontrolled diabetes mellitus ([place]) 2005   Allergies  Allergen Reactions  . Statins     Lipitor,simvatatin, pravastatin.welchol cause myalgia   Past Surgical History:  Procedure Laterality Date  . EXPLORATORY LAPAROTOMY  1976   endometriosis  . MINOR HEMORRHOIDECTOMY  2006  . OTHER SURGICAL HISTORY Right 1975  venous ligation   Family History  Problem Relation Age of Onset  . COPD Mother   . Diabetes Mellitus II Mother   . Heart  failure Mother   . Osteoarthritis Father   . Lung cancer Father 26  . Diabetes Sister   . Diabetes Brother   . Dementia Maternal Grandmother   . Heart disease Maternal Grandmother   . Heart disease Maternal Grandfather   . Stroke Maternal Grandfather   . Heart failure Maternal Aunt   . Lung cancer Paternal Grandfather   . Lung cancer Paternal Aunt    Social History   Socioeconomic History  . Marital status: Married    Spouse name: Octavia Bruckner  . Number of children: 2  . Years of education: 101  . Highest education level: Not on file  Occupational History  . Occupation: Pharmacist, hospital  . Occupation: retired Marine scientist  Social Needs  . Financial resource strain: Not on file  . Food insecurity:    Worry: Not on file    Inability: Not on file  . Transportation needs:    Medical: Not on file    Non-medical: Not on file  Tobacco Use  . Smoking status: Former Smoker    Types: Cigarettes  . Smokeless tobacco: Never Used  Substance and Sexual Activity  . Alcohol use: No  . Drug use: No  . Sexual activity: Yes    Partners: Male    Birth control/protection: None    Comment: Married  Lifestyle  . Physical activity:    Days per week: Not on file    Minutes per session: Not on file  . Stress: Not on file  Relationships  . Social connections:    Talks on phone: Not on file    Gets together: Not on file    Attends religious service: Not on file    Active member of club or organization: Not on file    Attends meetings of clubs or organizations: Not on file    Relationship status: Not on file  . Intimate partner violence:    Fear of current or ex partner: Not on file    Emotionally abused: Not on file    Physically abused: Not on file    Forced sexual activity: Not on file  Other Topics Concern  . Not on file  Social History Narrative   Married to [employer], 2 children.    BS, RN retired now Printmaker.    Drinks caffeine, uses herbal remedies. Daily vitmain use.   Wears her seatbelt, smoke  detector in the home.    exercises routinely.    She feels safe in her relationships.    Allergies as of 02/26/2018      Reactions   Statins    Lipitor,simvatatin, pravastatin.welchol cause myalgia      Medication List        Accurate as of 02/26/18  1:34 PM. Always use your most recent med list.          albuterol 108 (90 Base) MCG/ACT inhaler Commonly known as:  PROVENTIL HFA;VENTOLIN HFA Inhale 1-2 puffs into the lungs every 6 (six) hours as needed for wheezing or shortness of breath.   amLODIPine-Valsartan-HCTZ 10-320-25 MG Tabs Take 1 tablet by mouth daily.   diclofenac sodium 1 % Gel Commonly known as:  VOLTAREN Apply 4 g topically 4 (four) times daily. To affected joint.   docusate sodium 100 MG capsule Commonly known as:  COLACE Take 100 mg by mouth 2 (two) times daily.  FIBER-CAPS PO Take by mouth.   fluticasone 50 MCG/ACT nasal spray Commonly known as:  FLONASE Place 2 sprays into both nostrils daily.   gabapentin 300 MG capsule Commonly known as:  NEURONTIN Take 1 capsule (300 mg total) by mouth 2 (two) times daily.   glucose blood test strip Test blood sugar before meals and at bedtime   Insulin Detemir 100 UNIT/ML Pen Commonly known as:  LEVEMIR Inject 30 Units into the skin daily.   Insulin Pen Needle 31G X 5 MM Misc Use as directed   metFORMIN 1000 MG tablet Commonly known as:  GLUCOPHAGE Take 1 tablet (1,000 mg total) by mouth 2 (two) times daily with a meal.   multivitamin capsule Take 1 capsule by mouth daily.   ONETOUCH DELICA LANCETS 44R Misc 1 applicator by Does not apply route 4 (four) times daily -  before meals and at bedtime.   OSTEO BI-FLEX JOINT SHIELD PO Take 1 capsule by mouth daily.   rosuvastatin 5 MG tablet Commonly known as:  CRESTOR TAKE 1 TABLET BY MOUTH MONDAY-FRIDAY   tiZANidine 4 MG tablet Commonly known as:  ZANAFLEX Take 4 mg by mouth every 6 (six) hours as needed for muscle spasms.       All past  medical history, surgical history, allergies, family history, immunizations andmedications were updated in the EMR today and reviewed under the history and medication portions of their EMR.     Recent Results (from the past 2160 hour(s))  Hepatic function panel     Status: None   Collection Time: 01/01/18 10:37 AM  Result Value Ref Range   Total Bilirubin 0.5 0.2 - 1.2 mg/dL   Bilirubin, Direct 0.1 0.0 - 0.3 mg/dL   Alkaline Phosphatase 59 39 - 117 U/L   AST 13 0 - 37 U/L   ALT 17 0 - 35 U/L   Total Protein 6.7 6.0 - 8.3 g/dL   Albumin 4.2 3.5 - 5.2 g/dL  Hepatic function panel     Status: None   Collection Time: 02/05/18  8:28 AM  Result Value Ref Range   Total Bilirubin 0.5 0.2 - 1.2 mg/dL   Bilirubin, Direct 0.1 0.0 - 0.3 mg/dL   Alkaline Phosphatase 55 39 - 117 U/L   AST 14 0 - 37 U/L   ALT 15 0 - 35 U/L   Total Protein 6.7 6.0 - 8.3 g/dL   Albumin 4.3 3.5 - 5.2 g/dL    Mm Digital Screening  Addendum Date: 10/04/2016   ADDENDUM REPORT: 10/04/2016 09:12 ADDENDUM: Patient's prior exam from 02/14/2015 has become available for direct correlation. There are no significant changes compared to this prior exam as continue recommend annual bilateral screening mammographic followup. Electronically Signed   By: Marin Olp M.D.   On: 10/04/2016 09:12   Result Date: 10/04/2016 CLINICAL DATA:  Screening. EXAM: DIGITAL SCREENING BILATERAL MAMMOGRAM WITH CAD COMPARISON:  None. ACR Breast Density Category b: There are scattered areas of fibroglandular density. FINDINGS: There are no findings suspicious for malignancy. Images were processed with CAD. IMPRESSION: No mammographic evidence of malignancy. A result letter of this screening mammogram will be mailed directly to the patient. RECOMMENDATION: Screening mammogram in one year. (Code:SM-B-01Y) BI-RADS CATEGORY  1: Negative. Electronically Signed: By: Marin Olp M.D. On: 09/27/2016 08:00     ROS: 14 pt review of systems performed and  negative (unless mentioned in an HPI)  Objective: BP 138/84 (BP Location: Right Arm, Patient Position: Sitting, Cuff Size: Large)  Pulse 72   Temp 97.6 F (36.4 C)   Resp 20   Ht '5\' 11"'  (1.803 m)   Wt 259 lb 9.6 oz (117.8 kg)   SpO2 98%   BMI 36.21 kg/m  Gen: Afebrile. No acute distress. Nontoxic in appearance, well-developed, well-nourished,  obese caucasian, pleasant.  HENT: AT. Vernal. Bilateral TM visualized and normal in appearance, normal external auditory canal. MMM, no oral lesions, adequate dentition. Bilateral nares within normal limits. Throat without erythema, ulcerations or exudates. no Cough on exam, no hoarseness on exam. Eyes:Pupils Equal Round Reactive to light, Extraocular movements intact,  Conjunctiva without redness, discharge or icterus. Neck/lymp/endocrine: Supple,no lymphadenopathy, no thyromegaly CV: RRR no murmur, no edema, +2/4 P posterior tibialis pulses. no carotid bruits. No JVD. Chest: CTAB, no wheeze, rhonchi or crackles. normal Respiratory effort. good Air movement. Abd: Soft. obese. NTND. BS present. no Masses palpated. No hepatosplenomegaly. No rebound tenderness or guarding. Skin: no rashes, purpura or petechiae. Warm and well-perfused. Skin intact. Neuro/Msk:  Normal gait. PERLA. EOMi. Alert. Oriented x3.  Cranial nerves II through XII intact. Muscle strength 5/5 upper/lower extremity. DTRs equal bilaterally. Fungal infection of toenails is improving, new normal growth appearing.  Psych: Normal affect, dress and demeanor. Normal speech. Normal thought content and judgment.  No exam data present  Assessment/plan: Chyanna Flock is a 67 y.o. female present for CPE. Hypertension, benign/HLD - stable. Continue/refills amlodipine/valsartan/HCTZ combo.  - low sodium, exercise.  - Lipid panel - CBC w/Diff - Comp Met (CMET) - TSH - amLODIPine-Valsartan-HCTZ 10-320-25 MG TABS; Take 1 tablet by mouth daily.  Dispense: 90 tablet; Refill: 1 Breast cancer  screening - MM 3D SCREEN BREAST BILATERAL; Future Estrogen deficiency - DG Bone Density; Future Morbid obesity (HCC)/Other diabetic neurological complication associated with type 2 diabetes mellitus ([place])  stable.  - HgB A1c collected today - TSH - Levemir 30u BID prescribed (transition for lantus 2/2 to insurance) Continue gabapentin  PNA series: restarted series today for > 65. PSV23 provided, prevnar 1 year 02/2019 Flu shot: UTD 2019(recommneded yearly) BMP: normal, follow yearly Foot exam: 08/16/2017 Eye exam: 08/09/2017 --> requested records again A1c: 7.9--> 7.5 --> 6.2 --> 7.6--> 6.4 --> collected today Microallb: On ARB - gabapentin (NEURONTIN) 300 MG capsule; Take 1 capsule (300 mg total) by mouth 2 (two) times daily.  Dispense: 180 capsule; Refill: 1 - metFORMIN (GLUCOPHAGE) 1000 MG tablet; Take 1 tablet (1,000 mg total) by mouth 2 (two) times daily with a meal.  Dispense: 180 tablet; Refill: 1 Colon cancer screening - Ambulatory referral to Gastroenterology Immunization due - Pneumococcal polysaccharide vaccine 23-valent greater than or equal to 2yo subcutaneous/IM  Encounter for preventive health examination Patient was encouraged to exercise greater than 150 minutes a week. Patient was encouraged to choose a diet filled with fresh fruits and vegetables, and lean meats. AVS provided to patient today for education/recommendation on gender specific health and safety maintenance. Colonoscopy: completed 06/19/2013, by Dr in [place], 5 year f/u. Diverticula -- referral placed today Mammogram: completed: 09/27/2016, birads 2, benign- breast center- ordered placed today Cervical cancer screening: last pap: 08/13/2016, normal with neg HPV. No further testing > 65 Immunizations: td 2012, Influenza UTD 2017 (encouraged yearly), zostavax 2014, shingrix completed, PSV23 today (>65) Infectious disease screening: HIV and  Hep C declined DEXA: none, re-ordered today.   Return in about 4  months (around 06/27/2018).  Electronically signed by: [name], DO [place]

## 2018-02-26 NOTE — Patient Instructions (Signed)

## 2018-02-27 ENCOUNTER — Telehealth: Payer: Self-pay | Admitting: Family Medicine

## 2018-02-27 DIAGNOSIS — E785 Hyperlipidemia, unspecified: Secondary | ICD-10-CM

## 2018-02-27 MED ORDER — ROSUVASTATIN CALCIUM 5 MG PO TABS: 5.0000 mg | ORAL_TABLET | Freq: Every day | ORAL | 3 refills | Status: DC

## 2018-02-27 NOTE — Telephone Encounter (Signed)
Message left for patient to return call.

## 2018-02-27 NOTE — Telephone Encounter (Signed)
Please inform patient the following information: Her labs are mostly stable. Her a1c is 6.8, which is up from 6.4 last time. Encourage her to get back on the dietary modifications. Her cholesterol is elevated from last collection also with her LDL (bad) 108--> 133 this time. I have refilled her crestor- I encouraged her to try to take daily if able to tolerate.

## 2018-02-28 MED ORDER — INSULIN DETEMIR 100 UNIT/ML FLEXPEN: 30.0000 [IU] | PEN_INJECTOR | Freq: Two times a day (BID) | SUBCUTANEOUS | 1 refills | Status: DC

## 2018-02-28 MED ORDER — TIZANIDINE HCL 4 MG PO TABS: 4.0000 mg | ORAL_TABLET | Freq: Four times a day (QID) | ORAL | 5 refills | Status: DC | PRN

## 2018-02-28 NOTE — Addendum Note (Signed)
Addended by: [name] A on: 02/28/2018 12:57 PM   Modules accepted: Orders

## 2018-02-28 NOTE — Telephone Encounter (Addendum)
Patient notified of her results and provider recommendations. ( She will start daily Crestor) Patient has 2 requests from office visit:  She would like Levemer dosage increased on RX- she states she sometimes will use 30 units am/30 units pm and so  the present dosing is not going to be enough.  She also would like to get her Rx for Zanaflex refilled as discussed- she checked her Rx at home and she does need it.

## 2018-02-28 NOTE — Telephone Encounter (Signed)
Filled the zanafelx for her . The levemir was written and calculated as 30u BID.  The label was incorrect , but the amount is correct.

## 2018-02-28 NOTE — Telephone Encounter (Signed)
Detailed message left on voicemail.

## 2018-03-11 ENCOUNTER — Ambulatory Visit: Payer: Self-pay | Admitting: *Deleted

## 2018-03-11 NOTE — Telephone Encounter (Signed)
Summary: back pain    Patient would like a call back from nurse to discuss back pain that she has been experiencing for about x2 weeks. Patient would specifically like to know if there is anything she can do OTC until she is able to come in for OV. Please advise.       Reason for Disposition . [1] MODERATE back pain (e.g., interferes with normal activities) AND [2] present > 3 days  Answer Assessment - Initial Assessment Questions 1. ONSET: "When did the pain begin?"      Patient states she has a history of back problems- but with her job- she is pushing and lifting heavy equipment and she has aggravated it   8. MEDICATIONS: "What have you taken so far for the pain?" (e.g., nothing, acetaminophen, NSAIDS)     Patient is presently using OTC- Advil, heat,ice, did rest it over holiday Patient can come to office on Friday- she is going to call tomorrow to see if she can be scheduled in the same day slot for her back pain.  Protocols used: BACK PAIN-A-AH

## 2018-03-12 NOTE — Telephone Encounter (Signed)
Returned call to patient, about an appointment being scheduled for Friday at 11:00 . Roderic Ovens, RN scheduled appt. Patient notified and was very thankful.

## 2018-03-14 ENCOUNTER — Ambulatory Visit: Payer: 59 | Admitting: Family Medicine

## 2018-03-14 ENCOUNTER — Other Ambulatory Visit: Payer: Self-pay

## 2018-03-14 ENCOUNTER — Encounter: Payer: Self-pay | Admitting: Family Medicine

## 2018-03-14 VITALS — BP 145/76 | HR 69 | Temp 97.7°F | Resp 14 | Ht 71.0 in | Wt 261.0 lb

## 2018-03-14 DIAGNOSIS — M533 Sacrococcygeal disorders, not elsewhere classified: Secondary | ICD-10-CM | POA: Diagnosis not present

## 2018-03-14 MED ORDER — METHYLPREDNISOLONE ACETATE 80 MG/ML IJ SUSP
80.0000 mg | Freq: Once | INTRAMUSCULAR | Status: AC
  Administered 2018-03-14: 80 mg via INTRAMUSCULAR

## 2018-03-14 MED ORDER — DICLOFENAC SODIUM 75 MG PO TBEC: 75.0000 mg | DELAYED_RELEASE_TABLET | Freq: Two times a day (BID) | ORAL | 5 refills | Status: DC

## 2018-03-14 NOTE — Progress Notes (Signed)
Julie Osborne , July 29, 1950, 67 y.o., female MRN: [id number] Patient Care Team    Relationship Specialty Notifications Start End  Ma Hillock, DO PCP - General Family Medicine  01/13/16     Chief Complaint  Patient presents with  . Back Pain    Chronic -  Started after some heavy lifting right before thanksgiving.      Subjective: Pt presents for an OV with complaints of low back pain of 1 week duration.  Associated symptoms include pain across low back/sacral area. She reports she has had low back pain in the past. It usually resolves with muscle relaxer and rest. However, this time she does not feel it is improving despite those measures. She has never had a back injury or surgical procedure. She does not recall a specific injury this time either. 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 She reports she does have a back brace.  Pt has tried advil, zanaflex, ice to ease their symptoms.   Depression screen [facility] 2/9 02/26/2018 08/16/2017 08/13/2016 01/13/2016  Decreased Interest 0 0 0 0  Down, Depressed, Hopeless 0 0 0 0  PHQ - 2 Score 0 0 0 0    Allergies  Allergen Reactions  . Statins     Lipitor,simvatatin, pravastatin.welchol cause myalgia   Social History   Tobacco Use  . Smoking status: Former Smoker    Types: Cigarettes  . Smokeless tobacco: Never Used  Substance Use Topics  . Alcohol use: No   Past Medical History:  Diagnosis Date  . Apnea   . Degenerative arthritis   . Diabetic neuropathy ([place]) 2017  . Diverticulosis 06/19/2013   sigmoid colon on colonoscopy  . Hyperlipidemia 2000  . Hypertension, benign 1996  . Lumbar radiculopathy    Severe L4/L5 radiculopathy by old records.   . Tenosynovitis of ankle 2017   post tibial tendon dysfunction stage 5 (PTTD)  . Uncontrolled diabetes mellitus ([place]) 2005   Past Surgical History:  Procedure Laterality Date  . EXPLORATORY LAPAROTOMY  1976   endometriosis  . MINOR HEMORRHOIDECTOMY  2006  .  OTHER SURGICAL HISTORY Right 1975   venous ligation   Family History  Problem Relation Age of Onset  . COPD Mother   . Diabetes Mellitus II Mother   . Heart failure Mother   . Osteoarthritis Father   . Lung cancer Father 49  . Diabetes Sister   . Diabetes Brother   . Dementia Maternal Grandmother   . Heart disease Maternal Grandmother   . Heart disease Maternal Grandfather   . Stroke Maternal Grandfather   . Heart failure Maternal Aunt   . Lung cancer Paternal Grandfather   . Lung cancer Paternal Aunt    Allergies as of 03/14/2018      Reactions   Statins    Lipitor,simvatatin, pravastatin.welchol cause myalgia      Medication List        Accurate as of 03/14/18 11:12 AM. Always use your most recent med list.          albuterol 108 (90 Base) MCG/ACT inhaler Commonly known as:  PROVENTIL HFA;VENTOLIN HFA Inhale 1-2 puffs into the lungs every 6 (six) hours as needed for wheezing or shortness of breath.   amLODIPine-Valsartan-HCTZ 10-320-25 MG Tabs Take 1 tablet by mouth daily.   docusate sodium 100 MG capsule Commonly known as:  COLACE Take 100 mg by mouth 2 (two) times daily.   FIBER-CAPS PO Take by mouth.  fluticasone 50 MCG/ACT nasal spray Commonly known as:  FLONASE Place 2 sprays into both nostrils daily.   gabapentin 300 MG capsule Commonly known as:  NEURONTIN Take 1 capsule (300 mg total) by mouth 2 (two) times daily.   glucose blood test strip Test blood sugar before meals and at bedtime   Insulin Detemir 100 UNIT/ML Pen Commonly known as:  LEVEMIR Inject 30 Units into the skin 2 (two) times daily.   Insulin Pen Needle 31G X 5 MM Misc Use as directed   metFORMIN 1000 MG tablet Commonly known as:  GLUCOPHAGE Take 1 tablet (1,000 mg total) by mouth 2 (two) times daily with a meal.   multivitamin capsule Take 1 capsule by mouth daily.   ONETOUCH DELICA LANCETS 13Y Misc 1 applicator by Does not apply route 4 (four) times daily -  before  meals and at bedtime.   OSTEO BI-FLEX JOINT SHIELD PO Take 1 capsule by mouth daily.   rosuvastatin 5 MG tablet Commonly known as:  CRESTOR Take 1 tablet (5 mg total) by mouth at bedtime. T   tiZANidine 4 MG tablet Commonly known as:  ZANAFLEX Take 1 tablet (4 mg total) by mouth every 6 (six) hours as needed for muscle spasms.       All past medical history, surgical history, allergies, family history, immunizations andmedications were updated in the EMR today and reviewed under the history and medication portions of their EMR.     ROS: Negative, with the exception of above mentioned in HPI   Objective:  BP (!) 145/76   Pulse 69   Temp 97.7 F (36.5 C) (Oral)   Resp 14   Ht 5\' 11"  (1.803 m)   Wt 261 lb (118.4 kg)   SpO2 99%   BMI 36.40 kg/m  Body mass index is 36.4 kg/m. Gen: Afebrile. No acute distress. Nontoxic in appearance, well developed, well nourished.  MSK: no erythema, no soft tissue swelling. TTP left SI joint. Full ROM lumbar spine with discomfort left SB and ext.. + FABRE left SI pain. Negative SLR Neuro: Mildly guarded gait and transition of positions.  PERLA. EOMi. Alert. Oriented x3 Cranial nerves II through XII intact. Muscle strength 5/5 bilateral lower ext extremity. DTRs equal bilaterally.   No exam data present No results found. No results found for this or any previous visit (from the past 24 hour(s)).  Assessment/Plan: Julie Osborne is a 67 y.o. female present for OV for  Sacroiliac dysfunction - no red flags on exam. Exam consistent for left SI dysfunction.  - rest, Voltaren prescribed. Ice. IM depo medrol provided.  - light duty work excuse for 2 weeks. Try to avoid heavy lifting above waist.  - methylPREDNISolone acetate (DEPO-MEDROL) injection 80 mg - F/U 4 weeks PRn--> consider image. PT or OMT   Reviewed expectations re: course of current medical issues.  Discussed self-management of symptoms.  Outlined signs and symptoms  indicating need for more acute intervention.  Patient verbalized understanding and all questions were answered.  Patient received an After-Visit Summary.    No orders of the defined types were placed in this encounter.  > 25 minutes spent with patient, >50% of time spent face to face counseling     Note is dictated utilizing voice recognition software. Although note has been proof read prior to signing, occasional typographical errors still can be missed. If any questions arise, please do not hesitate to call for verification.   electronically signed by:  [name], DO  [facility] [place]

## 2018-03-14 NOTE — Patient Instructions (Signed)
Continue muscle relaxer, ice, rest and light duty. Start Voltaren pill every 12 hours with food.  Steroid shot today should improve your symptoms.   F/U 4 weeks if not improved.   Sacroiliac Joint Dysfunction Sacroiliac joint dysfunction is a condition that causes inflammation on one or both sides of the sacroiliac (SI) joint. The SI joint connects the lower part of the spine (sacrum) with the two upper portions of the pelvis (ilium). This condition causes deep aching or burning pain in the low back. In some cases, the pain may also spread into one or both buttocks or hips or spread down the legs. What are the causes? This condition may be caused by:  Pregnancy. During pregnancy, extra stress is put on the SI joints because the pelvis widens.  Injury, such as: ? Car accidents. ? Sport-related injuries. ? Work-related injuries.  Having one leg that is shorter than the other.  Conditions that affect the joints, such as: ? Rheumatoid arthritis. ? Gout. ? Psoriatic arthritis. ? Joint infection (septic arthritis).  Sometimes, the cause of SI joint dysfunction is not known. What are the signs or symptoms? Symptoms of this condition include:  Aching or burning pain in the lower back. The pain may also spread to other areas, such as: ? Buttocks. ? Groin. ? Thighs and legs.  Muscle spasms in or around the painful areas.  Increased pain when standing, walking, running, stair climbing, bending, or lifting.  How is this diagnosed? Your health care provider will do a physical exam and take your medical history. During the exam, the health care provider may move one or both of your legs to different positions to check for pain. Various tests may be done to help verify the diagnosis, including:  Imaging tests to look for other causes of pain. These may include: ? MRI. ? CT scan. ? Bone scan.  Diagnostic injection. A numbing medicine is injected into the SI joint using a needle. If  the pain is temporarily improved or stopped after the injection, this can indicate that SI joint dysfunction is the problem.  How is this treated? Treatment may vary depending on the cause and severity of your condition. Treatment options may include:  Applying ice or heat to the lower back area. This can help to reduce pain and muscle spasms.  Medicines to relieve pain or inflammation or to relax the muscles.  Wearing a back brace (sacroiliac brace) to help support the joint while your back is healing.  Physical therapy to increase muscle strength around the joint and flexibility at the joint. This may also involve learning proper body positions and ways of moving to relieve stress on the joint.  Direct manipulation of the SI joint.  Injections of steroid medicine into the joint in order to reduce pain and swelling.  Radiofrequency ablation to burn away nerves that are carrying pain messages from the joint.  Use of a device that provides electrical stimulation in order to reduce pain at the joint.  Surgery to put in screws and plates that limit or prevent joint motion. This is rare.  Follow these instructions at home:  Rest as needed. Limit your activities as directed by your health care provider.  Take medicines only as directed by your health care provider.  If directed, apply ice to the affected area: ? Put ice in a plastic bag. ? Place a towel between your skin and the bag. ? Leave the ice on for 20 minutes, 2-3 times per day.  Use a heating pad or a moist heat pack as directed by your health care provider.  Exercise as directed by your health care provider or physical therapist.  Keep all follow-up visits as directed by your health care provider. This is important. Contact a health care provider if:  Your pain is not controlled with medicine.  You have a fever.  You have increasingly severe pain. Get help right away if:  You have weakness, numbness, or tingling  in your legs or feet.  You lose control of your bladder or bowel. This information is not intended to replace advice given to you by your health care provider. Make sure you discuss any questions you have with your health care provider. Document Released: 06/22/2008 Document Revised: 09/01/2015 Document Reviewed: 12/01/2013 Elsevier Interactive Patient Education  [employer].

## 2018-04-17 ENCOUNTER — Ambulatory Visit: Payer: 59 | Admitting: Family Medicine

## 2018-04-17 ENCOUNTER — Ambulatory Visit (INDEPENDENT_AMBULATORY_CARE_PROVIDER_SITE_OTHER)
Admission: RE | Admit: 2018-04-17 | Discharge: 2018-04-17 | Disposition: A | Discharge status: Home / Self Care | Payer: 59 | Source: Ambulatory Visit | Attending: Family Medicine | Admitting: Family Medicine

## 2018-04-17 ENCOUNTER — Encounter: Payer: Self-pay | Admitting: Family Medicine

## 2018-04-17 VITALS — BP 142/70 | HR 67 | Temp 97.5°F | Resp 16 | Ht 71.0 in | Wt 260.0 lb

## 2018-04-17 DIAGNOSIS — M545 Low back pain, unspecified: Secondary | ICD-10-CM

## 2018-04-17 DIAGNOSIS — M5416 Radiculopathy, lumbar region: Secondary | ICD-10-CM

## 2018-04-17 MED ORDER — PREDNISONE 20 MG PO TABS: ORAL_TABLET | ORAL | 0 refills | Status: DC

## 2018-04-17 NOTE — Patient Instructions (Addendum)
We will get your xray for you today at the [place] office. We will get you to orthopedics if continues to cause a problem or xray requires urgent referral.  Until then NSAIDS., heat or ice.    Radicular Pain Radicular pain is a type of pain that spreads from your back or neck along a spinal nerve. Spinal nerves are nerves that leave the spinal cord and go to the muscles. Radicular pain is sometimes called radiculopathy, radiculitis, or a pinched nerve. When you have this type of pain, you may also have weakness, numbness, or tingling in the area of your body that is supplied by the nerve. The pain may feel sharp and burning. Depending on which spinal nerve is affected, the pain may occur in the:  Neck area (cervical radicular pain). You may also feel pain, numbness, weakness, or tingling in the arms.  Mid-spine area (thoracic radicular pain). You would feel this pain in the back and chest. This type is rare.  Lower back area (lumbar radicular pain). You would feel this pain as low back pain. You may feel pain, numbness, weakness, or tingling in the buttocks or legs. Sciatica is a type of lumbar radicular pain that shoots down the back of the leg. Radicular pain occurs when one of the spinal nerves becomes irritated or squeezed (compressed). It is often caused by something pushing on a spinal nerve, such as one of the bones of the spine (vertebrae) or one of the round cushions between vertebrae (intervertebral disks). This can result from:  An injury.  Wear and tear or aging of a disk.  The growth of a bone spur that pushes on the nerve. Radicular pain often goes away when you follow instructions from your health care provider for relieving pain at home. Follow these instructions at home: Managing pain      If directed, put ice on the affected area: ? Put ice in a plastic bag. ? Place a towel between your skin and the bag. ? Leave the ice on for 20 minutes, 2-3 times a day.  If directed,  apply heat to the affected area as often as told by your health care provider. Use the heat source that your health care provider recommends, such as a moist heat pack or a heating pad. ? Place a towel between your skin and the heat source. ? Leave the heat on for 20-30 minutes. ? Remove the heat if your skin turns bright red. This is especially important if you are unable to feel pain, heat, or cold. You may have a greater risk of getting burned. Activity   Do not sit or rest in bed for long periods of time.  Try to stay as active as possible. Ask your health care provider what type of exercise or activity is best for you.  Avoid activities that make your pain worse, such as bending and lifting.  Do not lift anything that is heavier than 10 lb (4.5 kg), or the limit that you are told, until your health care provider says that it is safe.  Practice using proper technique when lifting items. Proper lifting technique involves bending your knees and rising up.  Do strength and range-of-motion exercises only as told by your health care provider or physical therapist. General instructions  Take over-the-counter and prescription medicines only as told by your health care provider.  Pay attention to any changes in your symptoms.  Keep all follow-up visits as told by your health care provider. This  is important. ? Your health care provider may send you to a physical therapist to help with this pain. Contact a health care provider if:  Your pain and other symptoms get worse.  Your pain medicine is not helping.  Your pain has not improved after a few weeks of home care.  You have a fever. Get help right away if:  You have severe pain, weakness, or numbness.  You have difficulty with bladder or bowel control. Summary  Radicular pain is a type of pain that spreads from your back or neck along a spinal nerve.  When you have radicular pain, you may also have weakness, numbness, or  tingling in the area of your body that is supplied by the nerve.  The pain may feel sharp or burning.  Radicular pain may be treated with ice, heat, medicines, or physical therapy. This information is not intended to replace advice given to you by your health care provider. Make sure you discuss any questions you have with your health care provider. Document Released: 05/03/2004 Document Revised: 10/08/2017 Document Reviewed: 10/08/2017 Elsevier Interactive Patient Education  2019 [employer].

## 2018-04-17 NOTE — Progress Notes (Signed)
Julie Osborne , November 04, 1950, 68 y.o., female MRN: [id number] Patient Care Team    Relationship Specialty Notifications Start End  Ma Hillock, DO PCP - General Family Medicine  01/13/16     Chief Complaint  Patient presents with  . Follow-up    Back Pain, still having some pain     Subjective:  Presents today for follow-up on her low back pain that started on December 1.  Today her pain was located near the left SI.  She was treated with diclofenac, muscle relaxer, gabapentin and rest.  She was written for light duty at work.  She states she had mild improvement with the steroid injection provided that day.  However she has not had any improvement since and now the back pain is worsening.  She reports the pain is now across her lower back and intermittently radiates into her bilateral hips.  She reports the bilateral hip sensation as tingling.  Needs with increasing her "patient" she is noticing she has more back pain.  SHe did not feel the diclofenac was helpful, therefore she has stopped using a return to ibuprofen use.  He has stopped using the muscle relaxer. Prior:  Pt presents for an OV with complaints of low back pain of 1 week duration.  Associated symptoms include pain across low back/sacral area. She reports she has had low back pain in the past. It usually resolves with muscle relaxer and rest. However, this time she does not feel it is improving despite those measures. She has never had a back injury or surgical procedure. She does not recall a specific injury this time either. 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 She reports she does have a back brace.  Pt has tried advil, zanaflex, ice to ease their symptoms.   Depression screen [facility] 2/9 02/26/2018 08/16/2017 08/13/2016 01/13/2016  Decreased Interest 0 0 0 0  Down, Depressed, Hopeless 0 0 0 0  PHQ - 2 Score 0 0 0 0    Allergies  Allergen Reactions  . Statins     Lipitor,simvatatin, pravastatin.welchol  cause myalgia   Social History   Tobacco Use  . Smoking status: Former Smoker    Types: Cigarettes  . Smokeless tobacco: Never Used  Substance Use Topics  . Alcohol use: No   Past Medical History:  Diagnosis Date  . Apnea   . Degenerative arthritis   . Diabetic neuropathy ([place]) 2017  . Diverticulosis 06/19/2013   sigmoid colon on colonoscopy  . Hyperlipidemia 2000  . Hypertension, benign 1996  . Lumbar radiculopathy    Severe L4/L5 radiculopathy by old records.   . Tenosynovitis of ankle 2017   post tibial tendon dysfunction stage 5 (PTTD)  . Uncontrolled diabetes mellitus ([place]) 2005   Past Surgical History:  Procedure Laterality Date  . EXPLORATORY LAPAROTOMY  1976   endometriosis  . MINOR HEMORRHOIDECTOMY  2006  . OTHER SURGICAL HISTORY Right 1975   venous ligation   Family History  Problem Relation Age of Onset  . COPD Mother   . Diabetes Mellitus II Mother   . Heart failure Mother   . Osteoarthritis Father   . Lung cancer Father 7  . Diabetes Sister   . Diabetes Brother   . Dementia Maternal Grandmother   . Heart disease Maternal Grandmother   . Heart disease Maternal Grandfather   . Stroke Maternal Grandfather   . Heart failure Maternal Aunt   . Lung cancer Paternal Grandfather   .  Lung cancer Paternal Aunt    Allergies as of 04/17/2018      Reactions   Statins    Lipitor,simvatatin, pravastatin.welchol cause myalgia      Medication List       Accurate as of April 17, 2018 10:43 AM. Always use your most recent med list.        albuterol 108 (90 Base) MCG/ACT inhaler Commonly known as:  PROVENTIL HFA;VENTOLIN HFA Inhale 1-2 puffs into the lungs every 6 (six) hours as needed for wheezing or shortness of breath.   amLODIPine-Valsartan-HCTZ 10-320-25 MG Tabs Take 1 tablet by mouth daily.   diclofenac 75 MG EC tablet Commonly known as:  VOLTAREN Take 1 tablet (75 mg total) by mouth 2 (two) times daily.   docusate sodium 100 MG  capsule Commonly known as:  COLACE Take 100 mg by mouth 2 (two) times daily.   FIBER-CAPS PO Take by mouth.   fluticasone 50 MCG/ACT nasal spray Commonly known as:  FLONASE Place 2 sprays into both nostrils daily.   gabapentin 300 MG capsule Commonly known as:  NEURONTIN Take 1 capsule (300 mg total) by mouth 2 (two) times daily.   glucose blood test strip Commonly known as:  ONETOUCH VERIO Test blood sugar before meals and at bedtime   Insulin Detemir 100 UNIT/ML Pen Commonly known as:  LEVEMIR Inject 30 Units into the skin 2 (two) times daily.   Insulin Pen Needle 31G X 5 MM Misc Use as directed   metFORMIN 1000 MG tablet Commonly known as:  GLUCOPHAGE Take 1 tablet (1,000 mg total) by mouth 2 (two) times daily with a meal.   multivitamin capsule Take 1 capsule by mouth daily.   ONETOUCH DELICA LANCETS 09T Misc 1 applicator by Does not apply route 4 (four) times daily -  before meals and at bedtime.   OSTEO BI-FLEX JOINT SHIELD PO Take 1 capsule by mouth daily.   rosuvastatin 5 MG tablet Commonly known as:  CRESTOR Take 1 tablet (5 mg total) by mouth at bedtime. T   tiZANidine 4 MG tablet Commonly known as:  ZANAFLEX Take 1 tablet (4 mg total) by mouth every 6 (six) hours as needed for muscle spasms.       All past medical history, surgical history, allergies, family history, immunizations andmedications were updated in the EMR today and reviewed under the history and medication portions of their EMR.     ROS: Negative, with the exception of above mentioned in HPI   Objective:  BP (!) 145/77 (BP Location: Left Arm, Patient Position: Sitting, Cuff Size: Large)   Pulse 67   Temp (!) 97.5 F (36.4 C) (Oral)   Resp 16   Ht 5\' 11"  (1.803 m)   Wt 260 lb (117.9 kg)   SpO2 100%   BMI 36.26 kg/m  Body mass index is 36.26 kg/m.  Gen: Afebrile. No acute distress.  Obese Caucasian female. HENT: AT. [place].  MMM.  Eyes:Pupils Equal Round Reactive to light,  Extraocular movements intact,  Conjunctiva without redness, discharge or icterus. MSK: Edema, no soft tissue swelling of lumbar spine.  Tender to palpation now right SI.  Full range of motion lumbar spine with discomfort still present left side bending.  FABRE is negative today bilaterally, negative straight leg raises.  Neurovascularly intact distally. Neuro: Guarded gait, appears mildly uncomfortable/hesitant. PERLA. EOMi. Alert. Oriented. Muscle strength 5/5 bilateral lower extremity. DTRs equal bilaterally. Psych: Normal affect, dress and demeanor. Normal speech. Normal thought content and judgment.  No exam data present No results found. No results found for this or any previous visit (from the past 24 hour(s)).  Assessment/Plan: Julie Osborne is a 67 y.o. female present for OV for  obesity (HCC)/Lumbar pain with radiculopathy -Worsening lumbar back pain now present going on 6 weeks.  Did not respond to conservative measures. -Diclofenac was not useful, she can continue ibuprofen at this time. - Continue gabapentin. - DG Lumbar Spine Complete; Future - Ambulatory referral to Orthopedic Surgery -Obtain x-ray today.  Discussed starting physical therapy versus sports med/OMT versus orthopedics today in detail.  She does not want to use the oral steroids and it would be helpful to stay away from those given her diabetes.  However I did provide her with a prescription of a steroid taper for her to start if her back is worsening before we are able to get her into orthopedics.  Reviewed expectations re: course of current medical issues.  Discussed self-management of symptoms.  Outlined signs and symptoms indicating need for more acute intervention.  Patient verbalized understanding and all questions were answered.  Patient received an After-Visit Summary.    No orders of the defined types were placed in this encounter.   Note is dictated utilizing voice recognition software. Although  note has been proof read prior to signing, occasional typographical errors still can be missed. If any questions arise, please do not hesitate to call for verification.   electronically signed by:  [name], DO  [place]

## 2018-04-18 ENCOUNTER — Other Ambulatory Visit: Payer: Self-pay

## 2018-04-18 MED ORDER — GLUCOSE BLOOD VI STRP: ORAL_STRIP | 3 refills | Status: DC

## 2018-04-18 MED ORDER — ONETOUCH DELICA LANCETS 33G MISC: 1.0000 | Freq: Three times a day (TID) | 3 refills | Status: DC

## 2018-04-18 NOTE — Telephone Encounter (Signed)
Pt pharmacy calls requesting rf on pts diabetic supplies. Refilled w b#3 additional rf.

## 2018-04-21 ENCOUNTER — Encounter: Payer: Self-pay | Admitting: Family Medicine

## 2018-04-21 ENCOUNTER — Other Ambulatory Visit: Payer: Self-pay

## 2018-04-21 MED ORDER — INSULIN DETEMIR 100 UNIT/ML FLEXPEN: 30.0000 [IU] | PEN_INJECTOR | Freq: Two times a day (BID) | SUBCUTANEOUS | 0 refills | Status: DC

## 2018-04-21 NOTE — Telephone Encounter (Signed)
Pt states that her back pain is getting better w the steroid dose pack. But it has been making her blood sugar elevated so she is having to use more of her Levemir, so she needs a rf on this by next wk. Rfed medication w no additional rfs. Pt states that she does have an appt w the orthopedic tomorrow for her back pain.

## 2018-04-22 ENCOUNTER — Ambulatory Visit (INDEPENDENT_AMBULATORY_CARE_PROVIDER_SITE_OTHER): Payer: 59 | Admitting: Family Medicine

## 2018-04-22 ENCOUNTER — Encounter (INDEPENDENT_AMBULATORY_CARE_PROVIDER_SITE_OTHER): Payer: Self-pay | Admitting: Family Medicine

## 2018-04-22 DIAGNOSIS — G8929 Other chronic pain: Secondary | ICD-10-CM | POA: Diagnosis not present

## 2018-04-22 DIAGNOSIS — M545 Low back pain, unspecified: Secondary | ICD-10-CM

## 2018-04-22 DIAGNOSIS — J309 Allergic rhinitis, unspecified: Secondary | ICD-10-CM | POA: Insufficient documentation

## 2018-04-22 DIAGNOSIS — E7800 Pure hypercholesterolemia, unspecified: Secondary | ICD-10-CM | POA: Insufficient documentation

## 2018-04-22 NOTE — Progress Notes (Signed)
Office Visit Note   Patient: Julie Osborne           Date of Birth: [birth year]           MRN: [id number] Visit Date: 04/22/2018 Requested by: Ma Hillock, DO [id number] [place], [place] [address] PCP: Ma Hillock, DO  Subjective: Chief Complaint  Patient presents with  . Lower Back - Pain    This flareup started and stayed since 04/11/18 - pain off & on x years (known DDD). Occasional urinary incontinence. Does not radiate down the legs.    HPI: She is a 68 year old with low back pain.  Symptoms started early December.  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  She first noticed that twisting motions were causing quite a bit of sacroiliac pain.  Then she had a significant flareup requiring a visit to her PCP.  She was given an injection with some improvement.  Her pain has not gone away so she recently had some x-rays which we reviewed today.  She has been out of work this week because they do not allow light duty restrictions.  She has done some [place] exercises on her own with some temporary improvement.  She likes her job but is concerned that depending on what is going on with her back, she might not be able to do this anymore.              ROS: She has diabetes which is now under good control.  She is on Crestor chronically.  Objective: Vital Signs: There were no vitals taken for this visit.  Physical Exam:  Low back: No visible rash.  Slightly tender in the midline at L4-5 and L5-S1.  Slightly tender over the SI joints bilaterally.  Slightly tender in the left sciatic notch.  Negative straight leg raise, lower extremity strength and reflexes are still normal.  Imaging: Recent x-rays were reviewed showing anterolisthesis of L3 on L4, slight anterolisthesis of L4 on L5.  Degenerative disc disease at both levels with degenerative facet disease at multiple levels.  No sign of compression  fracture.  No sign of neoplasm.  There is significant calcification of the aorta.  Assessment & Plan: 1.  Low back pain with spondylolisthesis and arthritis, question spinal or foraminal stenosis.  Not improving with conservative treatment. -MRI to further evaluate.  Depending on the results, probably try a course of formal physical therapy.   Follow-Up Instructions: No follow-ups on file.      Procedures: No procedures performed  No notes on file    PMFS History: Patient Active Problem List   Diagnosis Date Noted  . Allergic rhinitis 04/22/2018  . Pure hypercholesterolemia 04/22/2018  . Lumbar back pain with radiculopathy affecting lower extremity 04/17/2018  . Closed fracture of neck of left radius 10/14/2017  . Morbid obesity ([place]) 11/29/2016  . Diabetes type 2, controlled ([place])   . Hypertension, benign   . Hyperlipidemia   . Posterior tibial tendon dysfunction (PTTD) of left lower extremity 04/10/2015  . Closed fracture of metatarsal bone 12/23/2012  . Other joint derangement, not elsewhere classified, ankle and foot 12/17/2011  . Pain in joint involving ankle and foot 12/17/2011  . Pes planus 12/17/2011  . Spinal stenosis, lumbar region, without neurogenic claudication 02/06/2007   Past Medical History:  Diagnosis Date  . Apnea   . Degenerative arthritis   . Diabetic neuropathy (  [place]) 2017  . Diverticulosis 06/19/2013   sigmoid colon on colonoscopy  . Hyperlipidemia 2000  . Hypertension, benign 1996  . Lumbar radiculopathy    Severe L4/L5 radiculopathy by old records.   . Tenosynovitis of ankle 2017   post tibial tendon dysfunction stage 5 (PTTD)  . Uncontrolled diabetes mellitus ([place]) 2005    Family History  Problem Relation Age of Onset  . COPD Mother   . Diabetes Mellitus II Mother   . Heart failure Mother   . Osteoarthritis Father   . Lung cancer Father 43  . Diabetes Sister   . Diabetes Brother   . Dementia Maternal Grandmother   . Heart disease  Maternal Grandmother   . Heart disease Maternal Grandfather   . Stroke Maternal Grandfather   . Heart failure Maternal Aunt   . Lung cancer Paternal Grandfather   . Lung cancer Paternal Aunt     Past Surgical History:  Procedure Laterality Date  . EXPLORATORY LAPAROTOMY  1976   endometriosis  . MINOR HEMORRHOIDECTOMY  2006  . OTHER SURGICAL HISTORY Right 1975   venous ligation   Social History   Occupational History  . Occupation: Pharmacist, hospital  . Occupation: retired Marine scientist  Tobacco Use  . Smoking status: Former Smoker    Types: Cigarettes  . Smokeless tobacco: Never Used  Substance and Sexual Activity  . Alcohol use: No  . Drug use: No  . Sexual activity: Yes    Partners: Male    Birth control/protection: None    Comment: Married

## 2018-04-28 ENCOUNTER — Ambulatory Visit
Admission: RE | Admit: 2018-04-28 | Discharge: 2018-04-28 | Disposition: A | Discharge status: Home / Self Care | Payer: 59 | Source: Ambulatory Visit | Attending: Family Medicine | Admitting: Family Medicine

## 2018-04-28 DIAGNOSIS — Z1239 Encounter for other screening for malignant neoplasm of breast: Secondary | ICD-10-CM

## 2018-04-28 DIAGNOSIS — E2839 Other primary ovarian failure: Secondary | ICD-10-CM

## 2018-04-29 ENCOUNTER — Ambulatory Visit
Admission: RE | Admit: 2018-04-29 | Discharge: 2018-04-29 | Disposition: A | Discharge status: Home / Self Care | Payer: 59 | Source: Ambulatory Visit | Attending: Family Medicine | Admitting: Family Medicine

## 2018-04-29 ENCOUNTER — Encounter (INDEPENDENT_AMBULATORY_CARE_PROVIDER_SITE_OTHER): Payer: Self-pay | Admitting: Family Medicine

## 2018-04-29 DIAGNOSIS — M545 Low back pain, unspecified: Secondary | ICD-10-CM

## 2018-04-29 DIAGNOSIS — G8929 Other chronic pain: Secondary | ICD-10-CM

## 2018-04-30 ENCOUNTER — Telehealth (INDEPENDENT_AMBULATORY_CARE_PROVIDER_SITE_OTHER): Payer: Self-pay | Admitting: Family Medicine

## 2018-04-30 NOTE — Telephone Encounter (Signed)
Lumbar MRI scan is notable for the following:  There is a disc protrusion toward the right contacting the right L3 nerve root at L3-4.  There is also severe right-sided and moderate left-sided facet joint arthritis as well as moderate to severe narrowing of the spinal canal at this level.  At L4-5 there is a left-sided disc protrusion contacting the left L4 nerve.  Once again there is severe facet joint arthritis and moderate to severe narrowing of the spinal canal and the nerve openings on both sides which could affect both of the L5 nerves.  At L5-S1 there is a small disc bulge with facet joint arthritis but no nerves being compressed.

## 2018-05-05 ENCOUNTER — Encounter (INDEPENDENT_AMBULATORY_CARE_PROVIDER_SITE_OTHER): Payer: Self-pay | Admitting: Family Medicine

## 2018-05-05 DIAGNOSIS — M545 Low back pain, unspecified: Secondary | ICD-10-CM

## 2018-05-05 DIAGNOSIS — G8929 Other chronic pain: Secondary | ICD-10-CM

## 2018-05-13 ENCOUNTER — Encounter (INDEPENDENT_AMBULATORY_CARE_PROVIDER_SITE_OTHER): Payer: Self-pay | Admitting: Physical Medicine and Rehabilitation

## 2018-05-13 ENCOUNTER — Ambulatory Visit (INDEPENDENT_AMBULATORY_CARE_PROVIDER_SITE_OTHER): Payer: Self-pay

## 2018-05-13 ENCOUNTER — Ambulatory Visit (INDEPENDENT_AMBULATORY_CARE_PROVIDER_SITE_OTHER): Payer: 59 | Admitting: Physical Medicine and Rehabilitation

## 2018-05-13 VITALS — BP 148/68 | HR 82 | Temp 98.5°F

## 2018-05-13 DIAGNOSIS — M5416 Radiculopathy, lumbar region: Secondary | ICD-10-CM

## 2018-05-13 MED ORDER — METHYLPREDNISOLONE ACETATE 80 MG/ML IJ SUSP
80.0000 mg | Freq: Once | INTRAMUSCULAR | Status: AC
  Administered 2018-05-13: 80 mg

## 2018-05-13 NOTE — Progress Notes (Signed)
 .  Numeric Pain Rating Scale and Functional Assessment Average Pain 8   In the last MONTH (on 0-10 scale) has pain interfered with the following?  1. General activity like being  able to carry out your everyday physical activities such as walking, climbing stairs, carrying groceries, or moving a chair?  Rating(5)   +Driver, -BT, -Dye Allergies.  

## 2018-05-19 ENCOUNTER — Telehealth: Payer: Self-pay | Admitting: Gastroenterology

## 2018-05-19 NOTE — Procedures (Signed)
Lumbar Epidural Steroid Injection - Interlaminar Approach with Fluoroscopic Guidance  Patient: Julie Osborne      Date of Birth: 10/28/1950 MRN: [id number] PCP: Ma Hillock, DO      Visit Date: 05/13/2018   Universal Protocol:     Consent Given By: the patient  Position: PRONE  Additional Comments: Vital signs were monitored before and after the procedure. Patient was prepped and draped in the usual sterile fashion. The correct patient, procedure, and site was verified.   Injection Procedure Details:  Procedure Site One Meds Administered:  Meds ordered this encounter  Medications  . methylPREDNISolone acetate (DEPO-MEDROL) injection 80 mg     Laterality: Right  Location/Site:  L4-L5  Needle size: 20 G  Needle type: Tuohy  Needle Placement: Paramedian epidural  Findings:   -Comments: Excellent flow of contrast into the epidural space.  Procedure Details: Using a paramedian approach from the side mentioned above, the region overlying the inferior lamina was localized under fluoroscopic visualization and the soft tissues overlying this structure were infiltrated with 4 ml. of 1% Lidocaine without Epinephrine. The Tuohy needle was inserted into the epidural space using a paramedian approach.   The epidural space was localized using loss of resistance along with lateral and bi-planar fluoroscopic views.  After negative aspirate for air, blood, and CSF, a 2 ml. volume of Isovue-250 was injected into the epidural space and the flow of contrast was observed. Radiographs were obtained for documentation purposes.    The injectate was administered into the level noted above.   Additional Comments:  The patient tolerated the procedure well Dressing: Band-Aid    Post-procedure details: Patient was observed during the procedure. Post-procedure instructions were reviewed.  Patient left the clinic in stable condition.

## 2018-05-19 NOTE — Progress Notes (Signed)
Julie Osborne - 68 y.o. female MRN [id number]  Date of birth: 03-17-1951  Office Visit Note: Visit Date: 05/13/2018 PCP: Ma Hillock, DO Referred by: Ma Hillock, DO  Subjective: Chief Complaint  Patient presents with  . Lower Back - Pain  . Right Leg - Pain  . Left Leg - Pain   HPI: Julie Osborne is a 68 y.o. female who comes in today For planned interlaminar epidural steroid injection at L4-5.  Patient comes in at the request of Dr. Legrand Como hilts.  She has had ongoing low back pain right more than left the bilateral referral pattern consistent with radicular type pain from stenosis.  She has had physical therapy with McKenzie exercises and medication management and activity reduction without any relief.  This is been ongoing now for many months.  MRI of lumbar spine does show stairstep listhesis with moderate to severe stenosis at L3-4 and L4-5 both central canal and lateral recess with some foraminal narrowing as well.  ROS Otherwise per HPI.  Assessment & Plan: Visit Diagnoses:  1. Lumbar radiculopathy     Plan: No additional findings.   Meds & Orders:  Meds ordered this encounter  Medications  . methylPREDNISolone acetate (DEPO-MEDROL) injection 80 mg    Orders Placed This Encounter  Procedures  . XR C-ARM NO REPORT  . Epidural Steroid injection    Follow-up: Return if symptoms worsen or fail to improve.   Procedures: No procedures performed  Lumbar Epidural Steroid Injection - Interlaminar Approach with Fluoroscopic Guidance  Patient: Julie Osborne      Date of Birth: 1950/08/12 MRN: [id number] PCP: Ma Hillock, DO      Visit Date: 05/13/2018   Universal Protocol:     Consent Given By: the patient  Position: PRONE  Additional Comments: Vital signs were monitored before and after the procedure. Patient was prepped and draped in the usual sterile fashion. The correct patient, procedure, and site was verified.   Injection Procedure Details:    Procedure Site One Meds Administered:  Meds ordered this encounter  Medications  . methylPREDNISolone acetate (DEPO-MEDROL) injection 80 mg     Laterality: Right  Location/Site:  L4-L5  Needle size: 20 G  Needle type: Tuohy  Needle Placement: Paramedian epidural  Findings:   -Comments: Excellent flow of contrast into the epidural space.  Procedure Details: Using a paramedian approach from the side mentioned above, the region overlying the inferior lamina was localized under fluoroscopic visualization and the soft tissues overlying this structure were infiltrated with 4 ml. of 1% Lidocaine without Epinephrine. The Tuohy needle was inserted into the epidural space using a paramedian approach.   The epidural space was localized using loss of resistance along with lateral and bi-planar fluoroscopic views.  After negative aspirate for air, blood, and CSF, a 2 ml. volume of Isovue-250 was injected into the epidural space and the flow of contrast was observed. Radiographs were obtained for documentation purposes.    The injectate was administered into the level noted above.   Additional Comments:  The patient tolerated the procedure well Dressing: Band-Aid    Post-procedure details: Patient was observed during the procedure. Post-procedure instructions were reviewed.  Patient left the clinic in stable condition.   Clinical History: MRI LUMBAR SPINE WITHOUT CONTRAST  TECHNIQUE: Multiplanar, multisequence MR imaging of the lumbar spine was performed. No intravenous contrast was administered.  COMPARISON:  None.  FINDINGS: SEGMENTATION: For the purposes of this report, the last well-formed intervertebral disc is  reported as L5-S1.  ALIGNMENT: Maintained lumbar lordosis. Grade 1 (6 mm) L3-4 anterolisthesis. Grade 1 (4 mm) L4-5 anterolisthesis. Minimal grade 1 L5-S1 anterolisthesis. No definite spondylolysis though CT is more definitive.  VERTEBRAE:Vertebral  bodies intact. Severe L3-4 disc height loss, moderate L4-5 and L5-S1 with disc desiccation. Moderate chronic discogenic endplate changes Q2-5. No suspicious or acute bone marrow signal.  CONUS MEDULLARIS AND CAUDA EQUINA: Conus medullaris terminates at L1-2 and demonstrates normal morphology and signal characteristics. Cauda equina is normal.  PARASPINAL AND OTHER SOFT TISSUES: Nonacute. LEFT > RIGHT lower lumbar multifidus atrophy.  DISC LEVELS:  T12-L1 and L1-2: No disc bulge, canal stenosis nor neural foraminal narrowing. Mild facet arthropathy.  L2-3: Small RIGHT subarticular to extraforaminal disc protrusion. Mild facet arthropathy and trace reactive effusions. No canal stenosis. Mild RIGHT neural foraminal narrowing.  L3-4: Anterolisthesis. Unroofing of moderate broad-based disc bulge. Superimposed moderate RIGHT subarticular extraforaminal disc protrusion displacing the exited RIGHT L3 nerve. Severe RIGHT and moderate LEFT facet arthropathy and ligamentum flavum redundancy. Moderate to severe canal stenosis. Severe RIGHT and mild LEFT neural foraminal narrowing.  L4-5: Anterolisthesis. Unroofing of moderate broad-based disc bulge with superimposed LEFT extraforaminal suspected disc protrusion displacing the exiting LEFT L4 nerve. Severe facet arthropathy and ligamentum flavum redundancy with trace facet effusions. Moderate to severe canal stenosis with LEFT > RIGHT lateral recess narrowing which may affect the traversing L5 nerves. Moderate RIGHT and severe LEFT neural foraminal narrowing, completely effaced.  L5-S1: Anterolisthesis. Small broad-based disc bulge. Moderate RIGHT and moderate to severe LEFT facet arthropathy with trace reactive effusions. No canal stenosis. Moderate LEFT neural foraminal narrowing.  IMPRESSION: 1. Grade 1 L3-4 and L4-5 anterolisthesis, minimal grade 1 L5-S1 anterolisthesis. No spondylolysis though CT is more sensitive. 2.  Moderate to severe canal stenosis L3-4 and L4-5. 3. Neural foraminal narrowing L2-3 through L5-S1: Severe on the RIGHT at L3-4 and severe on the LEFT at L4-5 due to disc protrusions. 4. LEFT L4 and possible RIGHT L3 nerve impingement.   Electronically Signed   By: Elon Alas M.D.   On: 04/30/2018 01:45   She reports that she has quit smoking. Her smoking use included cigarettes. She has never used smokeless tobacco.  Recent Labs    08/16/17 0837 11/27/17 0843 02/26/18 0849  HGBA1C 7.6 6.4* 6.8*    Objective:  VS:  HT:    WT:   BMI:     BP:(!) 148/68  HR:82bpm  TEMP:98.5 F (36.9 C)(Oral)  RESP:  Physical Exam  Ortho Exam Imaging: No results found.  Past Medical/Family/Surgical/Social History: Medications & Allergies reviewed per EMR, new medications updated. Patient Active Problem List   Diagnosis Date Noted  . Allergic rhinitis 04/22/2018  . Pure hypercholesterolemia 04/22/2018  . Lumbar back pain with radiculopathy affecting lower extremity 04/17/2018  . Closed fracture of neck of left radius 10/14/2017  . Morbid obesity ([place]) 11/29/2016  . Diabetes type 2, controlled ([place])   . Hypertension, benign   . Hyperlipidemia   . Posterior tibial tendon dysfunction (PTTD) of left lower extremity 04/10/2015  . Closed fracture of metatarsal bone 12/23/2012  . Other joint derangement, not elsewhere classified, ankle and foot 12/17/2011  . Pain in joint involving ankle and foot 12/17/2011  . Pes planus 12/17/2011  . Spinal stenosis, lumbar region, without neurogenic claudication 02/06/2007   Past Medical History:  Diagnosis Date  . Apnea   . Degenerative arthritis   . Diabetic neuropathy ([place]) 2017  . Diverticulosis 06/19/2013   sigmoid colon on  colonoscopy  . Hyperlipidemia 2000  . Hypertension, benign 1996  . Lumbar radiculopathy    Severe L4/L5 radiculopathy by old records.   . Tenosynovitis of ankle 2017   post tibial tendon dysfunction stage 5 (PTTD)   . Uncontrolled diabetes mellitus ([place]) 2005   Family History  Problem Relation Age of Onset  . COPD Mother   . Diabetes Mellitus II Mother   . Heart failure Mother   . Osteoarthritis Father   . Lung cancer Father 58  . Diabetes Sister   . Diabetes Brother   . Dementia Maternal Grandmother   . Heart disease Maternal Grandmother   . Heart disease Maternal Grandfather   . Stroke Maternal Grandfather   . Heart failure Maternal Aunt   . Lung cancer Paternal Grandfather   . Lung cancer Paternal Aunt    Past Surgical History:  Procedure Laterality Date  . EXPLORATORY LAPAROTOMY  1976   endometriosis  . MINOR HEMORRHOIDECTOMY  2006  . OTHER SURGICAL HISTORY Right 1975   venous ligation   Social History   Occupational History  . Occupation: Pharmacist, hospital  . Occupation: retired Marine scientist  Tobacco Use  . Smoking status: Former Smoker    Types: Cigarettes  . Smokeless tobacco: Never Used  Substance and Sexual Activity  . Alcohol use: No  . Drug use: No  . Sexual activity: Yes    Partners: Male    Birth control/protection: None    Comment: Married

## 2018-05-19 NOTE — Telephone Encounter (Signed)
Good Morning Dr. Bryan Lemma,  Patient last colon was done in [place] in 2015. Pt was advised to do a 5 yr colon. Per DOD I have sent the records over to you. Please let me know if okay to schedule for a direct colon.  Thank You  Phoebe Sharps

## 2018-05-21 NOTE — Telephone Encounter (Signed)
I reviewed the previous endoscopy report.  Performed in 06/2013.  Did make note that the bowel preparation was fair.  Sigmoid diverticulosis and otherwise normal with recommendation to repeat in 5 years.  Given the quality of the bowel preparation the previous recommendation repeat in 5 years, I agree with repeating a colonoscopy at this time.  Okay to schedule for direct access colonoscopy with me.  Thank you.

## 2018-05-27 ENCOUNTER — Encounter (INDEPENDENT_AMBULATORY_CARE_PROVIDER_SITE_OTHER): Payer: Self-pay | Admitting: Physical Medicine and Rehabilitation

## 2018-05-29 ENCOUNTER — Encounter: Payer: Self-pay | Admitting: Gastroenterology

## 2018-05-29 NOTE — Telephone Encounter (Signed)
Called and spoke with patient direct colonoscopy has been scheduled in the [facility].

## 2018-06-12 ENCOUNTER — Encounter (INDEPENDENT_AMBULATORY_CARE_PROVIDER_SITE_OTHER): Payer: Self-pay | Admitting: Physical Medicine and Rehabilitation

## 2018-06-24 ENCOUNTER — Encounter: Payer: 59 | Admitting: Gastroenterology

## 2018-06-27 ENCOUNTER — Telehealth (INDEPENDENT_AMBULATORY_CARE_PROVIDER_SITE_OTHER): Payer: Self-pay | Admitting: *Deleted

## 2018-06-27 DIAGNOSIS — M5416 Radiculopathy, lumbar region: Secondary | ICD-10-CM

## 2018-06-27 NOTE — Telephone Encounter (Signed)
Pt requests pain clinic referral 

## 2018-06-27 NOTE — Telephone Encounter (Signed)
Orders placed.

## 2018-06-27 NOTE — Telephone Encounter (Signed)
I called and advised the patient. 

## 2018-06-30 ENCOUNTER — Ambulatory Visit: Payer: 59 | Admitting: Family Medicine

## 2018-07-02 ENCOUNTER — Encounter (INDEPENDENT_AMBULATORY_CARE_PROVIDER_SITE_OTHER): Payer: 59 | Admitting: Physical Medicine and Rehabilitation

## 2018-07-09 ENCOUNTER — Ambulatory Visit: Payer: 59 | Admitting: Family Medicine

## 2018-07-25 ENCOUNTER — Other Ambulatory Visit: Payer: Self-pay

## 2018-07-25 ENCOUNTER — Ambulatory Visit (INDEPENDENT_AMBULATORY_CARE_PROVIDER_SITE_OTHER): Payer: 59 | Admitting: Family Medicine

## 2018-07-25 ENCOUNTER — Encounter: Payer: Self-pay | Admitting: Family Medicine

## 2018-07-25 VITALS — BP 141/79 | HR 73 | Temp 97.8°F | Ht 71.0 in | Wt 257.0 lb

## 2018-07-25 DIAGNOSIS — M79661 Pain in right lower leg: Secondary | ICD-10-CM

## 2018-07-25 NOTE — Progress Notes (Signed)
VIRTUAL VISIT VIA VIDEO  I connected with Julie Osborne on 07/25/18 at  1:20 PM EDT by a video enabled telemedicine application and verified that I am speaking with the correct person using two identifiers. Location patient: Home Location provider: [facility], Office Persons participating in the virtual visit: Patient, Dr. Raoul Pitch and R.Baker, LPN  I discussed the limitations of evaluation and management by telemedicine and the availability of in person appointments. The patient expressed understanding and agreed to proceed.   SUBJECTIVE Chief Complaint  Patient presents with  . Leg Swelling    Burning in right calf, Slightly swollen increased pain with flexing foot x3 days. No redness. Pt has had no injury. Took BP med around 7am and BP was taken at 1pm    HPI:  Right calf pain:  Patient reports burning in her right calf for the past 3 days.  He states there was slight redness and swelling yesterday which started concern her for a blood clot.  She states that flexing her foot caused increased discomfort in her calf.  She has no injury that she is aware of.  She is more sedentary than prior secondary to COVID-19.  She states she iced and kept elevated and today her leg is much improved.  She states there is no redness, no swelling and no tenderness today.  She denies shortness of breath or palpitations.   ROS: See pertinent positives and negatives per HPI.  Patient Active Problem List   Diagnosis Date Noted  . Allergic rhinitis 04/22/2018  . Pure hypercholesterolemia 04/22/2018  . Lumbar back pain with radiculopathy affecting lower extremity 04/17/2018  . Closed fracture of neck of left radius 10/14/2017  . Morbid obesity ([place]) 11/29/2016  . Diabetes type 2, controlled ([place])   . Hypertension, benign   . Hyperlipidemia   . Posterior tibial tendon dysfunction (PTTD) of left lower extremity 04/10/2015  . Closed fracture of metatarsal bone 12/23/2012  . Other joint  derangement, not elsewhere classified, ankle and foot 12/17/2011  . Pain in joint involving ankle and foot 12/17/2011  . Pes planus 12/17/2011  . Spinal stenosis, lumbar region, without neurogenic claudication 02/06/2007    Social History   Tobacco Use  . Smoking status: Former Smoker    Types: Cigarettes  . Smokeless tobacco: Never Used  Substance Use Topics  . Alcohol use: No    Current Outpatient Medications:  .  albuterol (PROVENTIL HFA;VENTOLIN HFA) 108 (90 Base) MCG/ACT inhaler, Inhale 1-2 puffs into the lungs every 6 (six) hours as needed for wheezing or shortness of breath., Disp: 1 Inhaler, Rfl: 0 .  amLODIPine-Valsartan-HCTZ 10-320-25 MG TABS, Take 1 tablet by mouth daily., Disp: 90 tablet, Rfl: 1 .  calcium citrate-vitamin D (CITRACAL+D) 315-200 MG-UNIT tablet, Take 1 tablet by mouth daily., Disp: , Rfl:  .  Calcium Polycarbophil (FIBER-CAPS PO), Take by mouth., Disp: , Rfl:  .  Cobalamin Combinations (VITAMIN B12-FOLIC ACID PO), Take 1 tablet by mouth daily., Disp: , Rfl:  .  Coenzyme Q10 (COQ10) 100 MG CAPS, Take 1 tablet by mouth daily., Disp: , Rfl:  .  diclofenac sodium (VOLTAREN) 1 % GEL, Apply topically 4 (four) times daily., Disp: , Rfl:  .  docusate sodium (COLACE) 100 MG capsule, Take 100 mg by mouth 2 (two) times daily., Disp: , Rfl:  .  fluticasone (FLONASE) 50 MCG/ACT nasal spray, Place 2 sprays into both nostrils daily., Disp: 16 g, Rfl: 11 .  glucose blood (ONETOUCH VERIO) test strip, Test  blood sugar before meals and at bedtime, Disp: 400 each, Rfl: 3 .  Insulin Detemir (LEVEMIR) 100 UNIT/ML Pen, Inject 30 Units into the skin 2 (two) times daily. (Patient taking differently: Inject 50 Units into the skin 2 (two) times daily. ), Disp: 60 mL, Rfl: 0 .  Insulin Pen Needle 31G X 5 MM MISC, Use as directed, Disp: 100 each, Rfl: 3 .  Magnesium 400 MG CAPS, Take 1 tablet by mouth daily., Disp: , Rfl:  .  Menaquinone-7 (VITAMIN K2) 100 MCG CAPS, Take 1 tablet by mouth  daily., Disp: , Rfl:  .  metFORMIN (GLUCOPHAGE) 1000 MG tablet, Take 1 tablet (1,000 mg total) by mouth 2 (two) times daily with a meal., Disp: 180 tablet, Rfl: 1 .  Multiple Vitamin (MULTIVITAMIN) capsule, Take 1 capsule by mouth daily., Disp: , Rfl:  .  ONETOUCH DELICA LANCETS 17B MISC, 1 applicator by Does not apply route 4 (four) times daily -  before meals and at bedtime., Disp: 400 each, Rfl: 3 .  rosuvastatin (CRESTOR) 5 MG tablet, Take 1 tablet (5 mg total) by mouth at bedtime. T, Disp: 90 tablet, Rfl: 3 .  Zinc 25 MG TABS, Take 1 tablet by mouth daily., Disp: , Rfl:  .  traMADol (ULTRAM) 50 MG tablet, TAKE 1 (ONE) TABLET TWO TIMES DAILY, AS NEEDED, Disp: , Rfl:   Allergies  Allergen Reactions  . Statins     Lipitor,simvatatin, pravastatin.welchol cause myalgia    OBJECTIVE: BP (!) 141/79   Pulse 73   Temp 97.8 F (36.6 C) (Oral)   Ht 5\' 11"  (1.803 m)   Wt 257 lb (116.6 kg)   BMI 35.84 kg/m  Gen: No acute distress. Nontoxic in appearance.  HENT: AT. [place].  MMM.  Eyes:Pupils Equal Round Reactive to light, Extraocular movements intact,  Conjunctiva without redness, discharge or icterus. CV: no edema Chest: Cough or shortness of breath not present.  EXT: No erythema, no swelling, no tenderness to palpation.  Full range of motion, neurovascularly intact distally. Neuro: Alert. Oriented x3  Psych: Normal affect, dress and demeanor. Normal speech. Normal thought content and judgment.  ASSESSMENT AND PLAN: Julie Osborne is a 68 y.o. female present for  Right calf pain -Discussed options with patient today.  Given that her symptoms improved with elevation and ice unlikely cause would be of a blood clot. -Advised her to start a baby aspirin daily if able to tolerate. -Monitor her lower extremity for any reoccurring symptoms, if any swelling, redness, bruising, discomfort or shortness of breath occurs she needs to go to the emergency room immediately for evaluation.  She is in  agreement with plan. Follow-up PRN.   [name], DO 07/25/2018

## 2018-07-28 ENCOUNTER — Encounter: Payer: Self-pay | Admitting: Family Medicine

## 2018-07-28 ENCOUNTER — Encounter (INDEPENDENT_AMBULATORY_CARE_PROVIDER_SITE_OTHER): Payer: Self-pay | Admitting: Physical Medicine and Rehabilitation

## 2018-07-28 DIAGNOSIS — M79661 Pain in right lower leg: Secondary | ICD-10-CM | POA: Insufficient documentation

## 2018-07-28 NOTE — Patient Instructions (Signed)
Continue to monitor. If symptoms reoccur present to ED for evaluation.

## 2018-08-19 ENCOUNTER — Encounter: Payer: Self-pay | Admitting: Physical Medicine and Rehabilitation

## 2018-08-19 ENCOUNTER — Ambulatory Visit: Payer: Self-pay

## 2018-08-19 ENCOUNTER — Ambulatory Visit: Payer: 59 | Admitting: Physical Medicine and Rehabilitation

## 2018-08-19 ENCOUNTER — Other Ambulatory Visit: Payer: Self-pay

## 2018-08-19 VITALS — BP 161/87 | HR 74

## 2018-08-19 DIAGNOSIS — M47816 Spondylosis without myelopathy or radiculopathy, lumbar region: Secondary | ICD-10-CM | POA: Diagnosis not present

## 2018-08-19 MED ORDER — METHYLPREDNISOLONE ACETATE 80 MG/ML IJ SUSP: 80.0000 mg | Freq: Once | INTRAMUSCULAR | Status: DC

## 2018-08-19 NOTE — Progress Notes (Signed)
 .  Numeric Pain Rating Scale and Functional Assessment Average Pain 8   In the last MONTH (on 0-10 scale) has pain interfered with the following?  1. General activity like being  able to carry out your everyday physical activities such as walking, climbing stairs, carrying groceries, or moving a chair?  Rating(5)   +Driver, -BT, -Dye Allergies.  

## 2018-08-25 NOTE — Procedures (Signed)
Lumbar Facet Joint Intra-Articular Injection(s) with Fluoroscopic Guidance  Patient: Julie Osborne      Date of Birth: 1950/05/16 MRN: [id number] PCP: Ma Hillock, DO      Visit Date: 08/19/2018   Universal Protocol:    Date/Time: 08/19/2018  Consent Given By: the patient  Position: PRONE   Additional Comments: Vital signs were monitored before and after the procedure. Patient was prepped and draped in the usual sterile fashion. The correct patient, procedure, and site was verified.   Injection Procedure Details:  Procedure Site One Meds Administered:  Meds ordered this encounter  Medications  . methylPREDNISolone acetate (DEPO-MEDROL) injection 80 mg     Laterality: Bilateral  Location/Site:  L4-L5 L5-S1  Needle size: 22 guage  Needle type: Spinal  Needle Placement: Articular  Findings:  -Comments: Excellent flow of contrast producing a partial arthrogram.  Procedure Details: The fluoroscope beam is vertically oriented in AP, and the inferior recess is visualized beneath the lower pole of the inferior apophyseal process, which represents the target point for needle insertion. When direct visualization is difficult the target point is located at the medial projection of the vertebral pedicle. The region overlying each aforementioned target is locally anesthetized with a 1 to 2 ml. volume of 1% Lidocaine without Epinephrine.   The spinal needle was inserted into each of the above mentioned facet joints using biplanar fluoroscopic guidance. A 0.25 to 0.5 ml. volume of Isovue-250 was injected and a partial facet joint arthrogram was obtained. A single spot film was obtained of the resulting arthrogram.    One to 1.25 ml of the steroid/anesthetic solution was then injected into each of the facet joints noted above.   Additional Comments:  The patient tolerated the procedure well Dressing: 2 x 2 sterile gauze and Band-Aid    Post-procedure details: Patient was  observed during the procedure. Post-procedure instructions were reviewed.  Patient left the clinic in stable condition.

## 2018-08-25 NOTE — Progress Notes (Signed)
Julie Osborne - 68 y.o. female MRN [id number]  Date of birth: April 25, 1950  Office Visit Note: Visit Date: 08/19/2018 PCP: Ma Hillock, DO Referred by: Ma Hillock, DO  Subjective: Chief Complaint  Patient presents with  . Lower Back - Pain   HPI:  Julie Osborne is a 68 y.o. female who comes in today For planned bilateral L4-5 and L5-S1 facet joint blocks.  Patient is followed by Dr. Eunice Blase.  Prior epidural injection at L4-5 did help quite a bit and her leg pain is much less.  She said one bout of calf pain that went away.  Unfortunately she has had L3-4 significant disc height loss from likely old degenerative herniated disc.  There is some right-sided foraminal protrusion.  This really would not cause leg pain but could cause thigh pain.  She has moderate to severe multifactorial central stenosis at L4-5 which I think is 1 of the issues with her leg pain.  Right now she is having basically low back pain mechanical worse with standing sit to stand and on exam with facet loading.  No leg pain.  We are going to complete diagnostic facet/medial branch blocks and see if she gets a lot of relief from that.  She has 2 issues going on which is the stenosis and the facet arthropathy.  If this alleviates a lot of back pain she might be a candidate for radiofrequency ablation.  Unfortunately she might also be a candidate for lumbar decompression.  ROS Otherwise per HPI.  Assessment & Plan: Visit Diagnoses:  1. Spondylosis without myelopathy or radiculopathy, lumbar region     Plan: No additional findings.   Meds & Orders:  Meds ordered this encounter  Medications  . methylPREDNISolone acetate (DEPO-MEDROL) injection 80 mg    Orders Placed This Encounter  Procedures  . Facet Injection  . XR C-ARM NO REPORT    Follow-up: No follow-ups on file.   Procedures: No procedures performed  Lumbar Facet Joint Intra-Articular Injection(s) with Fluoroscopic Guidance  Patient:  Julie Osborne      Date of Birth: 1950-07-28 MRN: [id number] PCP: Ma Hillock, DO      Visit Date: 08/19/2018   Universal Protocol:    Date/Time: 08/19/2018  Consent Given By: the patient  Position: PRONE   Additional Comments: Vital signs were monitored before and after the procedure. Patient was prepped and draped in the usual sterile fashion. The correct patient, procedure, and site was verified.   Injection Procedure Details:  Procedure Site One Meds Administered:  Meds ordered this encounter  Medications  . methylPREDNISolone acetate (DEPO-MEDROL) injection 80 mg     Laterality: Bilateral  Location/Site:  L4-L5 L5-S1  Needle size: 22 guage  Needle type: Spinal  Needle Placement: Articular  Findings:  -Comments: Excellent flow of contrast producing a partial arthrogram.  Procedure Details: The fluoroscope beam is vertically oriented in AP, and the inferior recess is visualized beneath the lower pole of the inferior apophyseal process, which represents the target point for needle insertion. When direct visualization is difficult the target point is located at the medial projection of the vertebral pedicle. The region overlying each aforementioned target is locally anesthetized with a 1 to 2 ml. volume of 1% Lidocaine without Epinephrine.   The spinal needle was inserted into each of the above mentioned facet joints using biplanar fluoroscopic guidance. A 0.25 to 0.5 ml. volume of Isovue-250 was injected and a partial facet joint arthrogram was obtained. A single spot  film was obtained of the resulting arthrogram.    One to 1.25 ml of the steroid/anesthetic solution was then injected into each of the facet joints noted above.   Additional Comments:  The patient tolerated the procedure well Dressing: 2 x 2 sterile gauze and Band-Aid    Post-procedure details: Patient was observed during the procedure. Post-procedure instructions were reviewed.  Patient  left the clinic in stable condition.    Clinical History: MRI LUMBAR SPINE WITHOUT CONTRAST  TECHNIQUE: Multiplanar, multisequence MR imaging of the lumbar spine was performed. No intravenous contrast was administered.  COMPARISON:  None.  FINDINGS: SEGMENTATION: For the purposes of this report, the last well-formed intervertebral disc is reported as L5-S1.  ALIGNMENT: Maintained lumbar lordosis. Grade 1 (6 mm) L3-4 anterolisthesis. Grade 1 (4 mm) L4-5 anterolisthesis. Minimal grade 1 L5-S1 anterolisthesis. No definite spondylolysis though CT is more definitive.  VERTEBRAE:Vertebral bodies intact. Severe L3-4 disc height loss, moderate L4-5 and L5-S1 with disc desiccation. Moderate chronic discogenic endplate changes O1-3. No suspicious or acute bone marrow signal.  CONUS MEDULLARIS AND CAUDA EQUINA: Conus medullaris terminates at L1-2 and demonstrates normal morphology and signal characteristics. Cauda equina is normal.  PARASPINAL AND OTHER SOFT TISSUES: Nonacute. LEFT > RIGHT lower lumbar multifidus atrophy.  DISC LEVELS:  T12-L1 and L1-2: No disc bulge, canal stenosis nor neural foraminal narrowing. Mild facet arthropathy.  L2-3: Small RIGHT subarticular to extraforaminal disc protrusion. Mild facet arthropathy and trace reactive effusions. No canal stenosis. Mild RIGHT neural foraminal narrowing.  L3-4: Anterolisthesis. Unroofing of moderate broad-based disc bulge. Superimposed moderate RIGHT subarticular extraforaminal disc protrusion displacing the exited RIGHT L3 nerve. Severe RIGHT and moderate LEFT facet arthropathy and ligamentum flavum redundancy. Moderate to severe canal stenosis. Severe RIGHT and mild LEFT neural foraminal narrowing.  L4-5: Anterolisthesis. Unroofing of moderate broad-based disc bulge with superimposed LEFT extraforaminal suspected disc protrusion displacing the exiting LEFT L4 nerve. Severe facet arthropathy and  ligamentum flavum redundancy with trace facet effusions. Moderate to severe canal stenosis with LEFT > RIGHT lateral recess narrowing which may affect the traversing L5 nerves. Moderate RIGHT and severe LEFT neural foraminal narrowing, completely effaced.  L5-S1: Anterolisthesis. Small broad-based disc bulge. Moderate RIGHT and moderate to severe LEFT facet arthropathy with trace reactive effusions. No canal stenosis. Moderate LEFT neural foraminal narrowing.  IMPRESSION: 1. Grade 1 L3-4 and L4-5 anterolisthesis, minimal grade 1 L5-S1 anterolisthesis. No spondylolysis though CT is more sensitive. 2. Moderate to severe canal stenosis L3-4 and L4-5. 3. Neural foraminal narrowing L2-3 through L5-S1: Severe on the RIGHT at L3-4 and severe on the LEFT at L4-5 due to disc protrusions. 4. LEFT L4 and possible RIGHT L3 nerve impingement.   Electronically Signed   By: Elon Alas M.D.   On: 04/30/2018 01:45     Objective:  VS:  HT:    WT:   BMI:     BP:(!) 161/87  HR:74bpm  TEMP: ( )  RESP:  Physical Exam  Ortho Exam Imaging: No results found.

## 2018-09-04 ENCOUNTER — Ambulatory Visit: Payer: Self-pay | Admitting: *Deleted

## 2018-09-04 NOTE — Telephone Encounter (Signed)
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; she would like to know about being tested and resulted prior to her return to work on September 08, 2018; explained to pt that results could take up to 72 hours or longer based on the number of tests that have to be processed; also advised pt that she should not attempt to travel back to [facility] for testing; recommendations made per nurse triage protocol; she verbalized understanding, and will go to [facility] in [place], [place] for evaluation; pt advised to notify staff that she is experiencing these symptoms; the pt sees Dr Raoul Pitch, [place]; will route to office for notification; the pt can be contacted at [phone], and would like a call from Dr Raoul Pitch; will also attempt to notify facility.  Reason for Disposition . MODERATE difficulty breathing (e.g., speaks in phrases, SOB even at rest, pulse 100-120)  Answer Assessment - Initial Assessment Questions 1. COVID-19 DIAGNOSIS: "Who made your Coronavirus (COVID-19) diagnosis?" "Was it confirmed by a positive lab test?" If not diagnosed by a HCP, ask "Are there lots of cases (community spread) where you live?" (See public health department website, if unsure)   * MAJOR community spread: high number of cases; numbers of cases are increasing; many people hospitalized.   * MINOR community spread: low number of cases; not increasing; few or no people hospitalized     major 2. ONSET: "When did the COVID-19 symptoms start?"   09/02/2018 3. WORST SYMPTOM: "What is your worst symptom?" (e.g., cough, fever, shortness of breath, muscle aches)  Shortness of breath 4. COUGH: "Do you have a cough?" If so, ask: "How bad is the cough?"       Yes, intermittent, non-productive 5. FEVER: "Do you have a fever?" If so, ask: "What is your temperature, how was it measured, and when did it start?"     No; no fever on 5/26, but no thermometer with pt while on vacation 6. RESPIRATORY STATUS: "Describe your breathing?" (e.g., shortness of breath, wheezing, unable to speak)     Shortness of breath, chest tightness 7. BETTER-SAME-WORSE: "Are you getting better, staying the same or getting worse compared to yesterday?"  If getting worse, ask, "In what way?"     worse 8. HIGH RISK DISEASE: "Do you have any chronic medical problems?" (e.g., asthma, heart or lung disease, weak immune system, etc.)     Diabetes, htn, increased cholesterol, seasonal bronchitis 9. PREGNANCY: "Is there any chance you are pregnant?" "When was your last menstrual period?"     no 10. OTHER SYMPTOMS: "Do you have any other symptoms?"  (e.g., runny nose, headache, sore throat, loss of smell)       Headache; weakness  Protocols used: CORONAVIRUS (COVID-19) DIAGNOSED OR SUSPECTED-A-AH

## 2018-09-04 NOTE — Telephone Encounter (Signed)
Notified Lovell Sheehan, Medical Assistant/Registrar at [place], [place] of pt's pending arrival; she verbalized understanding.

## 2018-09-05 NOTE — Telephone Encounter (Signed)
Patient returned call and spoke w/ Diane.  Patient stated that the UC dx her w/ pneumonia.   Patient states no call back needed, just wanted to let Dr Raoul Pitch know.

## 2018-09-05 NOTE — Telephone Encounter (Signed)
Sent to Dr Kuneff as a FYI  

## 2018-09-05 NOTE — Telephone Encounter (Signed)
Pt was called, no answer. Message was left to return call.

## 2018-09-12 ENCOUNTER — Encounter: Payer: Self-pay | Admitting: *Deleted

## 2018-09-12 ENCOUNTER — Other Ambulatory Visit: Payer: Self-pay

## 2018-09-12 ENCOUNTER — Ambulatory Visit (INDEPENDENT_AMBULATORY_CARE_PROVIDER_SITE_OTHER): Payer: 59 | Admitting: Family Medicine

## 2018-09-12 ENCOUNTER — Encounter: Payer: Self-pay | Admitting: Family Medicine

## 2018-09-12 VITALS — BP 137/80 | HR 79 | Temp 98.0°F | Ht 71.0 in | Wt 260.0 lb

## 2018-09-12 DIAGNOSIS — J989 Respiratory disorder, unspecified: Secondary | ICD-10-CM

## 2018-09-12 MED ORDER — INSULIN DETEMIR 100 UNIT/ML FLEXPEN: 60.0000 [IU] | PEN_INJECTOR | Freq: Two times a day (BID) | SUBCUTANEOUS | 0 refills | Status: DC

## 2018-09-12 NOTE — Progress Notes (Signed)
I have discussed the procedure for the virtual visit with the patient who has given consent to proceed with assessment and treatment.   Luanna Weesner, CMA     

## 2018-09-12 NOTE — Progress Notes (Signed)
VIRTUAL VISIT VIA VIDEO  I connected with Julie Osborne on 09/12/18 at  1:00 PM EDT by a video enabled telemedicine application and verified that I am speaking with the correct person using two identifiers. Location patient: Home Location provider: [facility], Office Persons participating in the virtual visit: Patient, Dr. Raoul Pitch and R.Baker, LPN  I discussed the limitations of evaluation and management by telemedicine and the availability of in person appointments. The patient expressed understanding and agreed to proceed.   SUBJECTIVE Chief Complaint  Patient presents with  . Letter for School/Work    Patient was out of town ([place] in [place]) about 2 weeks ago - started getting low grade fever, SOB, was seen out of town and was negative for COVID. She had symptoms until this Tuesday and she's been fine the last couple of days.  States that she was needing this visit to clear her to go to work.     HPI: Julie Osborne is a 68 y.o. female present to follow up on Resp illness- originally seen by an UC when she was out of town.  Current were reviewed of urgent care visit dated 09/04/2018.  Was treated with azithromycin.  She had lab work completed that included a negative COVID-19 test and a negative respiratory panel. Patient reports as of yesterday she is starting to feel back to her normal.  All of her symptoms have resolved.  She denies fever, cough, shortness of breath.  ROS: See pertinent positives and negatives per HPI.  Patient Active Problem List   Diagnosis Date Noted  . Right calf pain 07/28/2018  . Allergic rhinitis 04/22/2018  . Pure hypercholesterolemia 04/22/2018  . Lumbar back pain with radiculopathy affecting lower extremity 04/17/2018  . Closed fracture of neck of left radius 10/14/2017  . Morbid obesity ([place]) 11/29/2016  . Diabetes type 2, controlled ([place])   . Hypertension, benign   . Hyperlipidemia   . Posterior tibial tendon dysfunction (PTTD)  of left lower extremity 04/10/2015  . Closed fracture of metatarsal bone 12/23/2012  . Other joint derangement, not elsewhere classified, ankle and foot 12/17/2011  . Pain in joint involving ankle and foot 12/17/2011  . Pes planus 12/17/2011  . Spinal stenosis, lumbar region, without neurogenic claudication 02/06/2007    Social History   Tobacco Use  . Smoking status: Former Smoker    Types: Cigarettes  . Smokeless tobacco: Never Used  Substance Use Topics  . Alcohol use: No    Current Outpatient Medications:  .  albuterol (PROVENTIL HFA;VENTOLIN HFA) 108 (90 Base) MCG/ACT inhaler, Inhale 1-2 puffs into the lungs every 6 (six) hours as needed for wheezing or shortness of breath., Disp: 1 Inhaler, Rfl: 0 .  amLODIPine-Valsartan-HCTZ 10-320-25 MG TABS, Take 1 tablet by mouth daily., Disp: 90 tablet, Rfl: 1 .  calcium citrate-vitamin D (CITRACAL+D) 315-200 MG-UNIT tablet, Take 1 tablet by mouth daily., Disp: , Rfl:  .  Calcium Polycarbophil (FIBER-CAPS PO), Take by mouth., Disp: , Rfl:  .  Cobalamin Combinations (VITAMIN B12-FOLIC ACID PO), Take 1 tablet by mouth daily., Disp: , Rfl:  .  Coenzyme Q10 (COQ10) 100 MG CAPS, Take 1 tablet by mouth daily., Disp: , Rfl:  .  diclofenac sodium (VOLTAREN) 1 % GEL, Apply topically 4 (four) times daily., Disp: , Rfl:  .  docusate sodium (COLACE) 100 MG capsule, Take 100 mg by mouth 2 (two) times daily., Disp: , Rfl:  .  fluticasone (FLONASE) 50 MCG/ACT nasal spray, Place 2  sprays into both nostrils daily., Disp: 16 g, Rfl: 11 .  glucose blood (ONETOUCH VERIO) test strip, Test blood sugar before meals and at bedtime, Disp: 400 each, Rfl: 3 .  Insulin Detemir (LEVEMIR) 100 UNIT/ML Pen, Inject 30 Units into the skin 2 (two) times daily. (Patient taking differently: Inject 50 Units into the skin 2 (two) times daily. ), Disp: 60 mL, Rfl: 0 .  Insulin Pen Needle 31G X 5 MM MISC, Use as directed, Disp: 100 each, Rfl: 3 .  Magnesium 400 MG CAPS, Take 1  tablet by mouth daily., Disp: , Rfl:  .  Menaquinone-7 (VITAMIN K2) 100 MCG CAPS, Take 1 tablet by mouth daily., Disp: , Rfl:  .  metFORMIN (GLUCOPHAGE) 1000 MG tablet, Take 1 tablet (1,000 mg total) by mouth 2 (two) times daily with a meal., Disp: 180 tablet, Rfl: 1 .  Multiple Vitamin (MULTIVITAMIN) capsule, Take 1 capsule by mouth daily., Disp: , Rfl:  .  ONETOUCH DELICA LANCETS 94W MISC, 1 applicator by Does not apply route 4 (four) times daily -  before meals and at bedtime., Disp: 400 each, Rfl: 3 .  rosuvastatin (CRESTOR) 5 MG tablet, Take 1 tablet (5 mg total) by mouth at bedtime. T, Disp: 90 tablet, Rfl: 3 .  traMADol (ULTRAM) 50 MG tablet, TAKE 1 (ONE) TABLET TWO TIMES DAILY, AS NEEDED, Disp: , Rfl:  .  Zinc 25 MG TABS, Take 1 tablet by mouth daily., Disp: , Rfl:   Current Facility-Administered Medications:  .  methylPREDNISolone acetate (DEPO-MEDROL) injection 80 mg, 80 mg, Other, Once, Magnus Sinning, MD  Allergies  Allergen Reactions  . Statins     Lipitor,simvatatin, pravastatin.welchol cause myalgia    OBJECTIVE: BP 137/80   Pulse 79   Temp 98 F (36.7 C)   Ht 5\' 11"  (1.803 m)   Wt 260 lb (117.9 kg)   BMI 36.26 kg/m  Gen: No acute distress. Nontoxic in appearance.  HENT: AT. [place].  MMM.  Eyes:Pupils Equal Round Reactive to light, Extraocular movements intact,  Conjunctiva without redness, discharge or icterus. CV: no edema Chest: Cough or shortness of breath not present. Skin: No rashes, purpura or petechiae.  Neuro:  Normal gait. Alert. Oriented x3  Psych: Normal affect, dress and demeanor. Normal speech. Normal thought content and judgment.  ASSESSMENT AND PLAN: Julie Osborne is a 68 y.o. female present for  Respiratory illness Patient with history of respiratory illness was seen in an urgent care in [place] on 09/04/2018.  Respiratory panel and COVID-19 testing were negative.  She was treated with azithromycin.  She feels her symptoms have now resolved.  She  needs a letter to return to work. -Work excuse provided for 5/28/09/12/2018 for acute illness. -Follow-up PRN.  Patient is overdue for her diabetes follow-up which is supposed to be every 4 months.  She was strongly encouraged to make this appointment for next week.  [name], DO 09/12/2018

## 2018-09-24 ENCOUNTER — Other Ambulatory Visit: Payer: Self-pay

## 2018-09-24 ENCOUNTER — Encounter: Payer: Self-pay | Admitting: Family Medicine

## 2018-09-24 ENCOUNTER — Ambulatory Visit: Payer: 59 | Admitting: Family Medicine

## 2018-09-24 VITALS — BP 116/68 | HR 67 | Temp 97.9°F | Resp 16 | Ht 71.0 in | Wt 260.8 lb

## 2018-09-24 DIAGNOSIS — E1149 Type 2 diabetes mellitus with other diabetic neurological complication: Secondary | ICD-10-CM

## 2018-09-24 DIAGNOSIS — E785 Hyperlipidemia, unspecified: Secondary | ICD-10-CM | POA: Diagnosis not present

## 2018-09-24 DIAGNOSIS — Z794 Long term (current) use of insulin: Secondary | ICD-10-CM | POA: Diagnosis not present

## 2018-09-24 DIAGNOSIS — E1140 Type 2 diabetes mellitus with diabetic neuropathy, unspecified: Secondary | ICD-10-CM | POA: Diagnosis not present

## 2018-09-24 DIAGNOSIS — I10 Essential (primary) hypertension: Secondary | ICD-10-CM

## 2018-09-24 LAB — HEMOGLOBIN A1C: Hgb A1c MFr Bld: 7.6 % — ABNORMAL HIGH (ref 4.6–6.5)

## 2018-09-24 MED ORDER — AMLODIPINE-VALSARTAN-HCTZ 10-320-25 MG PO TABS: 1.0000 | ORAL_TABLET | Freq: Every day | ORAL | 1 refills | Status: DC

## 2018-09-24 MED ORDER — METFORMIN HCL 1000 MG PO TABS: 1000.0000 mg | ORAL_TABLET | Freq: Two times a day (BID) | ORAL | 1 refills | Status: DC

## 2018-09-24 MED ORDER — ROSUVASTATIN CALCIUM 5 MG PO TABS: 5.0000 mg | ORAL_TABLET | Freq: Every day | ORAL | 3 refills | Status: DC

## 2018-09-24 MED ORDER — INSULIN DETEMIR 100 UNIT/ML FLEXPEN: 60.0000 [IU] | PEN_INJECTOR | Freq: Two times a day (BID) | SUBCUTANEOUS | 1 refills | Status: DC

## 2018-09-24 MED ORDER — INSULIN PEN NEEDLE 31G X 5 MM MISC: 3 refills | Status: DC

## 2018-09-24 NOTE — Progress Notes (Signed)
Julie Osborne , 1950/05/30, 68 y.o., female MRN: [id number] Patient Care Team    Relationship Specialty Notifications Start End  Ma Hillock, DO PCP - General Family Medicine  01/13/16     Chief Complaint  Patient presents with  . CMC    hypertension, hyperlipidemia, and DM     Subjective: Pt presents for an OV for routine chronic condition follow up.  Diabetes/morbid obesity:  Pt reportscompliancewith metformin 1000 mg BID and Levemir 50 u BID. Patient denies dizziness, hyperglycemic or hypoglycemic events. Patient denies numbness, tingling in the extremities or nonhealing wounds of feet. She has been watching her diet more closely since she was needing more insulin.  She does have neuropathy of toes and hands on occasions. She has tolerating gabapentin 300/400. Onset: 2005 PNA series:  PSV23 02/2018, prevnar 1 year 02/2019 Flu shot: UTD 2019(recommneded yearly) BMP: 02/26/2018 normal, follow yearly Foot exam: 11/2017 Eye exam: 08/09/2017 (just went last few weeks) postponed d/t covid A1c: 7.9--> 7.5 --> 6.2 --> 7.6--> 6.4--> 6.8 >>collected today Microallb: On ARB  HTN/hyperlipidemia: Pt reports compliance with amlodopine-valsartan-hctz 10-320-25 today. Blood pressures ranges at home normal.Patient denies chest pain, shortness of breath, dizziness or lower extremity edema.   Depression screen [facility] 2/9 02/26/2018 08/16/2017 08/13/2016 01/13/2016  Decreased Interest 0 0 0 0  Down, Depressed, Hopeless 0 0 0 0  PHQ - 2 Score 0 0 0 0    Allergies  Allergen Reactions  . Statins     Lipitor,simvatatin, pravastatin.welchol cause myalgia   Social History   Social History Narrative   Married to [employer], 2 children.    BS, RN retired now Printmaker.    Drinks caffeine, uses herbal remedies. Daily vitmain use.   Wears her seatbelt, smoke detector in the home.    exercises routinely.    She feels safe in her relationships.    Past Medical History:  Diagnosis Date  . Apnea    . Degenerative arthritis   . Diabetic neuropathy ([place]) 2017  . Diverticulosis 06/19/2013   sigmoid colon on colonoscopy  . Hyperlipidemia 2000  . Hypertension, benign 1996  . Lumbar radiculopathy    Severe L4/L5 radiculopathy by old records.   . Tenosynovitis of ankle 2017   post tibial tendon dysfunction stage 5 (PTTD)  . Uncontrolled diabetes mellitus ([place]) 2005   Past Surgical History:  Procedure Laterality Date  . EXPLORATORY LAPAROTOMY  1976   endometriosis  . MINOR HEMORRHOIDECTOMY  2006  . OTHER SURGICAL HISTORY Right 1975   venous ligation   Family History  Problem Relation Age of Onset  . COPD Mother   . Diabetes Mellitus II Mother   . Heart failure Mother   . Osteoarthritis Father   . Lung cancer Father 57  . Diabetes Sister   . Diabetes Brother   . Dementia Maternal Grandmother   . Heart disease Maternal Grandmother   . Heart disease Maternal Grandfather   . Stroke Maternal Grandfather   . Heart failure Maternal Aunt   . Lung cancer Paternal Grandfather   . Lung cancer Paternal Aunt    Allergies as of 09/24/2018      Reactions   Statins    Lipitor,simvatatin, pravastatin.welchol cause myalgia      Medication List       Accurate as of September 24, 2018  8:16 AM. If you have any questions, ask your nurse or doctor.        albuterol 108 (90 Base) MCG/ACT inhaler Commonly known  as: VENTOLIN HFA Inhale 1-2 puffs into the lungs every 6 (six) hours as needed for wheezing or shortness of breath.   amLODIPine-Valsartan-HCTZ 10-320-25 MG Tabs Take 1 tablet by mouth daily.   calcium citrate-vitamin D 315-200 MG-UNIT tablet Commonly known as: CITRACAL+D Take 1 tablet by mouth daily.   CoQ10 100 MG Caps Take 1 tablet by mouth daily.   diclofenac sodium 1 % Gel Commonly known as: VOLTAREN Apply topically 4 (four) times daily.   docusate sodium 100 MG capsule Commonly known as: COLACE Take 100 mg by mouth 2 (two) times daily.   FIBER-CAPS PO Take by  mouth.   fluticasone 50 MCG/ACT nasal spray Commonly known as: FLONASE Place 2 sprays into both nostrils daily.   glucose blood test strip Commonly known as: OneTouch Verio Test blood sugar before meals and at bedtime   Insulin Detemir 100 UNIT/ML Pen Commonly known as: LEVEMIR Inject 60 Units into the skin 2 (two) times daily.   Insulin Pen Needle 31G X 5 MM Misc Use as directed   Magnesium 400 MG Caps Take 1 tablet by mouth daily.   metFORMIN 1000 MG tablet Commonly known as: GLUCOPHAGE Take 1 tablet (1,000 mg total) by mouth 2 (two) times daily with a meal.   multivitamin capsule Take 1 capsule by mouth daily.   OneTouch Delica Lancets 16X Misc 1 applicator by Does not apply route 4 (four) times daily -  before meals and at bedtime.   rosuvastatin 5 MG tablet Commonly known as: CRESTOR Take 1 tablet (5 mg total) by mouth at bedtime. T   traMADol 50 MG tablet Commonly known as: ULTRAM TAKE 1 (ONE) TABLET TWO TIMES DAILY, AS NEEDED   VITAMIN B12-FOLIC ACID PO Take 1 tablet by mouth daily.   Vitamin K2 100 MCG Caps Take 1 tablet by mouth daily.   Zinc 25 MG Tabs Take 1 tablet by mouth daily.       All past medical history, surgical history, allergies, family history, immunizations andmedications were updated in the EMR today and reviewed under the history and medication portions of their EMR.     ROS: Negative, with the exception of above mentioned in HPI   Objective:  BP 116/68 (BP Location: Left Arm, Patient Position: Sitting, Cuff Size: Large)   Pulse 67   Temp 97.9 F (36.6 C) (Oral)   Resp 16   Ht 5\' 11"  (1.803 m)   Wt 260 lb 12.8 oz (118.3 kg)   SpO2 100%   BMI 36.37 kg/m  Body mass index is 36.37 kg/m. Gen: Afebrile. No acute distress. Nontoxic in appearance, well developed, well nourished.  HENT: AT. [place].  MMM Eyes:Pupils Equal Round Reactive to light, Extraocular movements intact,  Conjunctiva without redness, discharge or icterus.  Neck/lymp/endocrine: Supple,no lymphadenopathy CV: RRR no murmur, no edema Chest: CTAB, no wheeze or crackles. Good air movement, normal resp effort.  Abd: Soft. NTND. BS present. no Masses palpated. No rebound or guarding.  Skin: no rashes, purpura or petechiae.  Neuro:  Normal gait. PERLA. EOMi. Alert. Oriented x3  Diabetic Foot Exam - Simple   Simple Foot Form Diabetic Foot exam was performed with the following findings: Yes 09/24/2018  8:57 AM  Visual Inspection No deformities, no ulcerations, no other skin breakdown bilaterally: Yes Sensation Testing Intact to touch and monofilament testing bilaterally: Yes Pulse Check Posterior Tibialis and Dorsalis pulse intact bilaterally: Yes Comments Mild neuropathy left medial foot     No exam data present No results  found. No results found for this or any previous visit (from the past 24 hour(s)).  Assessment/Plan: Julie Osborne is a 68 y.o. female present for OV for  Hypertension, benign/HLD - Stable. Continue/refills amlodipine/valsartan/HCTZ combo.  - low sodium, exercise.  - amLODIPine-Valsartan-HCTZ 10-320-25 MG TABS; Take 1 tablet by mouth daily.  Dispense: 90 tablet; Refill: 1  Morbid obesity (HCC)/Other diabetic neurological complication associated with type 2 diabetes mellitus ([place])  - was having higher sugar readings when ill, a1c will likely be more elevated. Her fasting sugars are not getting back to normal. Discussed adding on another oral agent if a1c>8 otherwise will wait on increasing regmen since her sugars are returning to normal. - HgB A1c collected today - Levemir 50u BID prescribed  Continue gabapentin  PNA series:  PSV23 02/2018, prevnar 1 year 02/2019 Flu shot: UTD 2019(recommneded yearly) BMP: 02/26/2018 normal, follow yearly Foot exam: 11/2017 Eye exam: 08/09/2017 (just went last few weeks) postponed d/y covid A1c: 7.9--> 7.5 --> 6.2 --> 7.6--> 6.4--> 6.8 >>collected today Microallb: On ARB  F/u 4 mos    Reviewed expectations re: course of current medical issues.  Discussed self-management of symptoms.  Outlined signs and symptoms indicating need for more acute intervention.  Patient verbalized understanding and all questions were answered.  Patient received an After-Visit Summary.    No orders of the defined types were placed in this encounter.    Note is dictated utilizing voice recognition software. Although note has been proof read prior to signing, occasional typographical errors still can be missed. If any questions arise, please do not hesitate to call for verification.   electronically signed by:  [name], DO  [place]

## 2018-09-24 NOTE — Patient Instructions (Signed)
I have refilled your meds.   Diabetes Mellitus and Exercise Exercising regularly is important for your overall health, especially when you have diabetes (diabetes mellitus). Exercising is not only about losing weight. It has many other health benefits, such as increasing muscle strength and bone density and reducing body fat and stress. This leads to improved fitness, flexibility, and endurance, all of which result in better overall health. Exercise has additional benefits for people with diabetes, including:  Reducing appetite.  Helping to lower and control blood glucose.  Lowering blood pressure.  Helping to control amounts of fatty substances (lipids) in the blood, such as cholesterol and triglycerides.  Helping the body to respond better to insulin (improving insulin sensitivity).  Reducing how much insulin the body needs.  Decreasing the risk for heart disease by: ? Lowering cholesterol and triglyceride levels. ? Increasing the levels of good cholesterol. ? Lowering blood glucose levels. What is my activity plan? Your health care provider or certified diabetes educator can help you make a plan for the type and frequency of exercise (activity plan) that works for you. Make sure that you:  Do at least 150 minutes of moderate-intensity or vigorous-intensity exercise each week. This could be brisk walking, biking, or water aerobics. ? Do stretching and strength exercises, such as yoga or weightlifting, at least 2 times a week. ? Spread out your activity over at least 3 days of the week.  Get some form of physical activity every day. ? Do not go more than 2 days in a row without some kind of physical activity. ? Avoid being inactive for more than 30 minutes at a time. Take frequent breaks to walk or stretch.  Choose a type of exercise or activity that you enjoy, and set realistic goals.  Start slowly, and gradually increase the intensity of your exercise over time. What do I need  to know about managing my diabetes?   Check your blood glucose before and after exercising. ? If your blood glucose is 240 mg/dL (13.3 mmol/L) or higher before you exercise, check your urine for ketones. If you have ketones in your urine, do not exercise until your blood glucose returns to normal. ? If your blood glucose is 100 mg/dL (5.6 mmol/L) or lower, eat a snack containing 15-20 grams of carbohydrate. Check your blood glucose 15 minutes after the snack to make sure that your level is above 100 mg/dL (5.6 mmol/L) before you start your exercise.  Know the symptoms of low blood glucose (hypoglycemia) and how to treat it. Your risk for hypoglycemia increases during and after exercise. Common symptoms of hypoglycemia can include: ? Hunger. ? Anxiety. ? Sweating and feeling clammy. ? Confusion. ? Dizziness or feeling light-headed. ? Increased heart rate or palpitations. ? Blurry vision. ? Tingling or numbness around the mouth, lips, or tongue. ? Tremors or shakes. ? Irritability.  Keep a rapid-acting carbohydrate snack available before, during, and after exercise to help prevent or treat hypoglycemia.  Avoid injecting insulin into areas of the body that are going to be exercised. For example, avoid injecting insulin into: ? The arms, when playing tennis. ? The legs, when jogging.  Keep records of your exercise habits. Doing this can help you and your health care provider adjust your diabetes management plan as needed. Write down: ? Food that you eat before and after you exercise. ? Blood glucose levels before and after you exercise. ? The type and amount of exercise you have done. ? When your insulin  is expected to peak, if you use insulin. Avoid exercising at times when your insulin is peaking.  When you start a new exercise or activity, work with your health care provider to make sure the activity is safe for you, and to adjust your insulin, medicines, or food intake as needed.   Drink plenty of water while you exercise to prevent dehydration or heat stroke. Drink enough fluid to keep your urine clear or pale yellow. Summary  Exercising regularly is important for your overall health, especially when you have diabetes (diabetes mellitus).  Exercising has many health benefits, such as increasing muscle strength and bone density and reducing body fat and stress.  Your health care provider or certified diabetes educator can help you make a plan for the type and frequency of exercise (activity plan) that works for you.  When you start a new exercise or activity, work with your health care provider to make sure the activity is safe for you, and to adjust your insulin, medicines, or food intake as needed. This information is not intended to replace advice given to you by your health care provider. Make sure you discuss any questions you have with your health care provider. Document Released: 06/16/2003 Document Revised: 10/04/2016 Document Reviewed: 09/05/2015 Elsevier Interactive Patient Education  2019 [employer].

## 2018-10-16 DIAGNOSIS — I200 Unstable angina: Secondary | ICD-10-CM | POA: Insufficient documentation

## 2018-10-16 DIAGNOSIS — R9439 Abnormal result of other cardiovascular function study: Secondary | ICD-10-CM | POA: Insufficient documentation

## 2018-10-17 HISTORY — PX: CORONARY ANGIOPLASTY WITH STENT PLACEMENT: SHX49

## 2018-11-11 ENCOUNTER — Encounter: Payer: Self-pay | Admitting: Family Medicine

## 2018-11-13 ENCOUNTER — Encounter: Payer: Self-pay | Admitting: Family Medicine

## 2018-11-14 ENCOUNTER — Encounter: Payer: Self-pay | Admitting: Family Medicine

## 2018-11-17 NOTE — Progress Notes (Signed)
Order(s) created erroneously. Erroneous order ID: [id number]  Order moved by: Genia Harold D  Order move date/time: 11/17/2018 3:51 PM  Source Patient: [id number]  Source Contact: 11/13/2018  Destination Patient: [id number]  Destination Contact: 03/24/2018

## 2018-12-04 ENCOUNTER — Other Ambulatory Visit: Payer: Self-pay

## 2018-12-04 ENCOUNTER — Ambulatory Visit (INDEPENDENT_AMBULATORY_CARE_PROVIDER_SITE_OTHER): Payer: 59 | Admitting: Family Medicine

## 2018-12-04 ENCOUNTER — Encounter: Payer: Self-pay | Admitting: Family Medicine

## 2018-12-04 VITALS — BP 134/75 | HR 60 | Temp 98.2°F | Resp 19 | Ht 71.0 in | Wt 263.5 lb

## 2018-12-04 DIAGNOSIS — Z23 Encounter for immunization: Secondary | ICD-10-CM

## 2018-12-04 DIAGNOSIS — E1140 Type 2 diabetes mellitus with diabetic neuropathy, unspecified: Secondary | ICD-10-CM | POA: Diagnosis not present

## 2018-12-04 DIAGNOSIS — E785 Hyperlipidemia, unspecified: Secondary | ICD-10-CM | POA: Diagnosis not present

## 2018-12-04 DIAGNOSIS — Z794 Long term (current) use of insulin: Secondary | ICD-10-CM

## 2018-12-04 DIAGNOSIS — I10 Essential (primary) hypertension: Secondary | ICD-10-CM | POA: Diagnosis not present

## 2018-12-04 DIAGNOSIS — I2510 Atherosclerotic heart disease of native coronary artery without angina pectoris: Secondary | ICD-10-CM

## 2018-12-04 DIAGNOSIS — Z9861 Coronary angioplasty status: Secondary | ICD-10-CM

## 2018-12-04 MED ORDER — GLIPIZIDE 5 MG PO TABS: 5.0000 mg | ORAL_TABLET | Freq: Two times a day (BID) | ORAL | 0 refills | Status: DC

## 2018-12-04 NOTE — Progress Notes (Signed)
Julie Osborne , 1951-04-06, 68 y.o., female MRN: [id number] Patient Care Team    Relationship Specialty Notifications Start End  Ma Hillock, DO PCP - General Family Medicine  01/13/16     Chief Complaint  Patient presents with  . Follow-up    Medicatoin F/U and Stent placement      Subjective: Pt presents for an OV to discuss med changes and new history surrounding her chest pain visit to ED 10/15/2018 and her STENT replacement. She presented to the ED with chest pain and hypertensive urgency. She has had follow up with her cardiologist 10/29/2018., Dr. Ouida Sills. She underwent a CAD s/p PCI with DES X2 to mid and proximal LAD. Echo revealed preserved EF 60-65% with mild TR 10/17/2018.She is to continue DAPT therapy with ASA/Brilinta and GDMT with Toprol-XL 25mg  daily. She has hd a few sessions at cardiac rehab. She is is due to follow up with cardio again next month.  She reports compliance with amlodipine-valsartan-HCTZ 10-3 20-25 mg daily. Patient denies chest pain, shortness of breath, dizziness or lower extremity edema.   Diabetes/morbid obesity:  Pt reportscompliancewith metformin 1000 mg BID and Levemir 50 u BID. Patient denies dizziness, hyperglycemic or hypoglycemic events. Patient denies numbness, tingling in the extremities or nonhealing wounds of feet.   Patient denies numbness, tingling in the extremities or nonhealing wounds of feet.  She does have neuropathy of toes and hands on occasions. She has tolerating gabapentin 300/400. She is poor reporting fasting glucose of 140-180 routinely. Onset: 2005 PNA series: PSV23 02/2018, prevnar 1 year 02/2019 Flu shot: UTD 2019(recommneded yearly) BMP: 02/26/2018 normal, follow yearly Foot exam: 11/2017 Eye exam: 08/09/2017 (just went last few weeks) postponed d/t covid A1c: 7.9--> 7.5 --> 6.2 --> 7.6--> 6.4--> 6.8 >> too early to recheck. Microallb: On ARB   Depression screen [facility] 2/9 02/26/2018 08/16/2017 08/13/2016 01/13/2016   Decreased Interest 0 0 0 0  Down, Depressed, Hopeless 0 0 0 0  PHQ - 2 Score 0 0 0 0    Allergies  Allergen Reactions  . Statins     Lipitor,simvatatin, pravastatin.welchol cause myalgia   Social History   Social History Narrative   Married to [employer], 2 children.    BS, RN retired now Printmaker.    Drinks caffeine, uses herbal remedies. Daily vitmain use.   Wears her seatbelt, smoke detector in the home.    exercises routinely.    She feels safe in her relationships.    Past Medical History:  Diagnosis Date  . Apnea   . Degenerative arthritis   . Diabetic neuropathy ([place]) 2017  . Diverticulosis 06/19/2013   sigmoid colon on colonoscopy  . Hyperlipidemia 2000  . Hypertension, benign 1996  . Lumbar radiculopathy    Severe L4/L5 radiculopathy by old records.   . Tenosynovitis of ankle 2017   post tibial tendon dysfunction stage 5 (PTTD)  . Uncontrolled diabetes mellitus ([place]) 2005   Past Surgical History:  Procedure Laterality Date  . CORONARY ANGIOPLASTY WITH STENT PLACEMENT  10/17/2018   DES x2/LAD  . EXPLORATORY LAPAROTOMY  1976   endometriosis  . MINOR HEMORRHOIDECTOMY  2006  . OTHER SURGICAL HISTORY Right 1975   venous ligation   Family History  Problem Relation Age of Onset  . COPD Mother   . Diabetes Mellitus II Mother   . Heart failure Mother   . Osteoarthritis Father   . Lung cancer Father 48  . Diabetes Sister   . Diabetes Brother   .  Dementia Maternal Grandmother   . Heart disease Maternal Grandmother   . Heart disease Maternal Grandfather   . Stroke Maternal Grandfather   . Heart failure Maternal Aunt   . Lung cancer Paternal Grandfather   . Lung cancer Paternal Aunt    Allergies as of 12/04/2018      Reactions   Statins    Lipitor,simvatatin, pravastatin.welchol cause myalgia      Medication List       Accurate as of December 04, 2018  8:36 AM. If you have any questions, ask your nurse or doctor.        albuterol 108 (90 Base) MCG/ACT  inhaler Commonly known as: VENTOLIN HFA Inhale 1-2 puffs into the lungs every 6 (six) hours as needed for wheezing or shortness of breath.   amLODIPine-Valsartan-HCTZ 10-320-25 MG Tabs Take 1 tablet by mouth daily.   aspirin 81 MG EC tablet Take by mouth.   calcium citrate-vitamin D 315-200 MG-UNIT tablet Commonly known as: CITRACAL+D Take 1 tablet by mouth daily.   CoQ10 100 MG Caps Take 1 tablet by mouth daily.   diclofenac sodium 1 % Gel Commonly known as: VOLTAREN Apply topically 4 (four) times daily.   docusate sodium 100 MG capsule Commonly known as: COLACE Take 100 mg by mouth 2 (two) times daily.   FIBER-CAPS PO Take by mouth.   fluticasone 50 MCG/ACT nasal spray Commonly known as: FLONASE Place 2 sprays into both nostrils daily.   glucose blood test strip Commonly known as: OneTouch Verio Test blood sugar before meals and at bedtime   Insulin Detemir 100 UNIT/ML Pen Commonly known as: LEVEMIR Inject 60 Units into the skin 2 (two) times daily.   Insulin Pen Needle 31G X 5 MM Misc Use as directed   Magnesium 400 MG Caps Take 1 tablet by mouth daily.   Magnesium 400 MG Tabs Take by mouth.   metFORMIN 1000 MG tablet Commonly known as: GLUCOPHAGE Take 1 tablet (1,000 mg total) by mouth 2 (two) times daily with a meal.   metoprolol succinate 25 MG 24 hr tablet Commonly known as: TOPROL-XL Take by mouth.   multivitamin capsule Take 1 capsule by mouth daily.   nitroGLYCERIN 0.4 MG SL tablet Commonly known as: NITROSTAT Place under the tongue.   OneTouch Delica Lancets [id number] Misc 1 applicator by Does not apply route 4 (four) times daily -  before meals and at bedtime.   rosuvastatin 5 MG tablet Commonly known as: CRESTOR Take 1 tablet (5 mg total) by mouth at bedtime. T What changed: how much to take   ticagrelor 90 MG Tabs tablet Commonly known as: BRILINTA Take by mouth.   traMADol 50 MG tablet Commonly known as: ULTRAM TAKE 1 (ONE) TABLET  TWO TIMES DAILY, AS NEEDED   VITAMIN B12-FOLIC ACID PO Take 1 tablet by mouth daily.   Vitamin D3 75 MCG (3000 UT) Tabs Take by mouth.   Vitamin K2 100 MCG Caps Take 1 tablet by mouth daily.   Zinc 25 MG Tabs Take 1 tablet by mouth daily.       All past medical history, surgical history, allergies, family history, immunizations andmedications were updated in the EMR today and reviewed under the history and medication portions of their EMR.     ROS: Negative, with the exception of above mentioned in HPI   Objective:  BP 134/75 (BP Location: Left Arm, Patient Position: Sitting, Cuff Size: Normal)   Pulse 60   Temp 98.2 F (36.8 C) (  Temporal)   Resp 19   Ht 5\' 11"  (1.803 m)   Wt 263 lb 8 oz (119.5 kg)   SpO2 98%   BMI 36.75 kg/m  Body mass index is 36.75 kg/m. Gen: Afebrile. No acute distress. Nontoxic in appearance, well developed, well nourished.  HENT: AT. [place]. MMM Eyes:Pupils Equal Round Reactive to light, Extraocular movements intact,  Conjunctiva without redness, discharge or icterus. Neck/lymp/endocrine: Supple, no lymphadenopathy CV: RRR no murmur, no edema Chest: CTAB, no wheeze or crackles. Good air movement, normal resp effort.  Neuro:  Normal gait. PERLA. EOMi. Alert. Oriented x3  Psych: Normal affect, dress and demeanor. Normal speech. Normal thought content and judgment.  No exam data present No results found. No results found for this or any previous visit (from the past 24 hour(s)).  Assessment/Plan: Brandan Sockwell is a 68 y.o. female present for OV for  Need for influenza vaccination - Flu Vaccine QUAD High Dose(Fluad)  Controlled type 2 diabetes mellitus with diabetic neuropathy, with long-term current use of insulin (HCC) Continue current regimen of metformin and Levemir 50 units twice daily.  Added glipizide 5 mg twice daily.  We will follow-up at her routine follow-up with note that the glipizide will be on board only for approximately 4-6 weeks.   Hyperlipidemia, unspecified hyperlipidemia type/Hypertension, benign/Morbid obesity (HCC)/CAD S/P percutaneous coronary angioplasty Blood pressure controlled today.  Discussed regimen of Brilinta, Crestor, metoprolol, BP combination amlodipine/HCTZ/valsartan.  Patient is doing good with medication changes. -Tolerating cardiac rehab -Continue routine follow-ups with cardiology. -Discussed sleep study given her recent cardiac history and agree study is reasonable with her hypertension, obesity, neck girth.  No witnessed apneic spells or snoring.  Referral placed today.  > 25 minutes spent with patient, >50% of time spent face to face     Reviewed expectations re: course of current medical issues.  Discussed self-management of symptoms.  Outlined signs and symptoms indicating need for more acute intervention.  Patient verbalized understanding and all questions were answered.  Patient received an After-Visit Summary.    No orders of the defined types were placed in this encounter.    Note is dictated utilizing voice recognition software. Although note has been proof read prior to signing, occasional typographical errors still can be missed. If any questions arise, please do not hesitate to call for verification.   electronically signed by:  [name], DO  [place]

## 2018-12-04 NOTE — Patient Instructions (Addendum)
Great to see you today.  I am glad you are doing well.  I sent in the glipizide to start as we discussed.

## 2018-12-05 ENCOUNTER — Encounter: Payer: Self-pay | Admitting: Family Medicine

## 2018-12-08 DIAGNOSIS — I2510 Atherosclerotic heart disease of native coronary artery without angina pectoris: Secondary | ICD-10-CM | POA: Insufficient documentation

## 2018-12-08 DIAGNOSIS — Z9861 Coronary angioplasty status: Secondary | ICD-10-CM | POA: Insufficient documentation

## 2018-12-08 MED ORDER — CLOTRIMAZOLE 1 % EX SOLN: 1.0000 "application " | Freq: Two times a day (BID) | CUTANEOUS | 5 refills | Status: DC

## 2018-12-08 NOTE — Telephone Encounter (Signed)
Pt wrote the following my chart message:  Just checking in if you had thought of what I could use on my affected finger nails that we talked about at Thursday visit.  I do not see where this was addressed in your note. Please advise if patient needs appt.

## 2018-12-08 NOTE — Telephone Encounter (Signed)
Called in the solution we spoke of.

## 2018-12-15 ENCOUNTER — Encounter: Payer: Self-pay | Admitting: Family Medicine

## 2019-01-08 ENCOUNTER — Telehealth: Payer: Self-pay | Admitting: Family Medicine

## 2019-01-08 NOTE — Telephone Encounter (Signed)
Patient dropped off handicap paperwork today to be completed. Please contact patient when paperwork is ready for pick up.

## 2019-01-08 NOTE — Telephone Encounter (Signed)
Placed papers on Dr. Lucita Lora desk 01/08/2019 to be filled out

## 2019-01-09 NOTE — Telephone Encounter (Signed)
Completed and returned

## 2019-01-09 NOTE — Telephone Encounter (Signed)
Her paperwork is ready for pick up. Patient is aware. Paperwork placed up front Thank you

## 2019-01-21 ENCOUNTER — Encounter: Payer: Self-pay | Admitting: Family Medicine

## 2019-01-21 ENCOUNTER — Other Ambulatory Visit: Payer: Self-pay

## 2019-01-21 ENCOUNTER — Ambulatory Visit (INDEPENDENT_AMBULATORY_CARE_PROVIDER_SITE_OTHER): Payer: Medicare Other | Admitting: Family Medicine

## 2019-01-21 VITALS — BP 123/73 | HR 58 | Temp 98.7°F | Resp 17 | Ht 71.0 in | Wt 264.5 lb

## 2019-01-21 DIAGNOSIS — Z794 Long term (current) use of insulin: Secondary | ICD-10-CM

## 2019-01-21 DIAGNOSIS — E785 Hyperlipidemia, unspecified: Secondary | ICD-10-CM

## 2019-01-21 DIAGNOSIS — I10 Essential (primary) hypertension: Secondary | ICD-10-CM | POA: Diagnosis not present

## 2019-01-21 DIAGNOSIS — Z9861 Coronary angioplasty status: Secondary | ICD-10-CM

## 2019-01-21 DIAGNOSIS — E1140 Type 2 diabetes mellitus with diabetic neuropathy, unspecified: Secondary | ICD-10-CM

## 2019-01-21 DIAGNOSIS — I2510 Atherosclerotic heart disease of native coronary artery without angina pectoris: Secondary | ICD-10-CM | POA: Diagnosis not present

## 2019-01-21 DIAGNOSIS — M5416 Radiculopathy, lumbar region: Secondary | ICD-10-CM

## 2019-01-21 LAB — LIPID PANEL
Cholesterol: 122 mg/dL (ref 0–200)
HDL: 47.7 mg/dL (ref >39.00)
LDL Cholesterol: 49 mg/dL (ref 0–99)
NonHDL: 74.77
Total CHOL/HDL Ratio: 3
Triglycerides: 127 mg/dL (ref 0.0–149.0)
VLDL: 25.4 mg/dL (ref 0.0–40.0)

## 2019-01-21 LAB — CBC WITH DIFFERENTIAL/PLATELET
Basophils Absolute: 0 10*3/uL (ref 0.0–0.1)
Basophils Relative: 0.5 % (ref 0.0–3.0)
Eosinophils Absolute: 0.1 10*3/uL (ref 0.0–0.7)
Eosinophils Relative: 1.6 % (ref 0.0–5.0)
HCT: 37.3 % (ref 36.0–46.0)
Hemoglobin: 12.6 g/dL (ref 12.0–15.0)
Lymphocytes Relative: 42.7 % (ref 12.0–46.0)
Lymphs Abs: 2.8 10*3/uL (ref 0.7–4.0)
MCHC: 33.7 g/dL (ref 30.0–36.0)
MCV: 88.8 fl (ref 78.0–100.0)
Monocytes Absolute: 0.5 10*3/uL (ref 0.1–1.0)
Monocytes Relative: 8.3 % (ref 3.0–12.0)
Neutro Abs: 3.1 10*3/uL (ref 1.4–7.7)
Neutrophils Relative %: 46.9 % (ref 43.0–77.0)
Platelets: 251 10*3/uL (ref 150.0–400.0)
RBC: 4.2 Mil/uL (ref 3.87–5.11)
RDW: 13.9 % (ref 11.5–15.5)
WBC: 6.6 10*3/uL (ref 4.0–10.5)

## 2019-01-21 LAB — POCT GLYCOSYLATED HEMOGLOBIN (HGB A1C)
HbA1c POC (<> result, manual entry): 6.8 % (ref 4.0–5.6)
HbA1c, POC (controlled diabetic range): 6.8 % (ref 0.0–7.0)
HbA1c, POC (prediabetic range): 6.8 % — AB (ref 5.7–6.4)
Hemoglobin A1C: 6.8 % — AB (ref 4.0–5.6)

## 2019-01-21 LAB — COMPREHENSIVE METABOLIC PANEL
ALT: 16 U/L (ref 0–35)
AST: 14 U/L (ref 0–37)
Albumin: 4.4 g/dL (ref 3.5–5.2)
Alkaline Phosphatase: 57 U/L (ref 39–117)
BUN: 9 mg/dL (ref 6–23)
CO2: 29 mEq/L (ref 19–32)
Calcium: 9.8 mg/dL (ref 8.4–10.5)
Chloride: 99 mEq/L (ref 96–112)
Creatinine, Ser: 0.63 mg/dL (ref 0.40–1.20)
GFR: 94.5 mL/min (ref >60.00)
Glucose, Bld: 69 mg/dL — ABNORMAL LOW (ref 70–99)
Potassium: 3.9 mEq/L (ref 3.5–5.1)
Sodium: 137 mEq/L (ref 135–145)
Total Bilirubin: 0.4 mg/dL (ref 0.2–1.2)
Total Protein: 6.6 g/dL (ref 6.0–8.3)

## 2019-01-21 LAB — TSH: TSH: 0.86 u[IU]/mL (ref 0.35–4.50)

## 2019-01-21 MED ORDER — GLIPIZIDE 10 MG PO TABS: ORAL_TABLET | ORAL | 1 refills | Status: DC

## 2019-01-21 MED ORDER — INSULIN DETEMIR 100 UNIT/ML FLEXPEN: 40.0000 [IU] | PEN_INJECTOR | Freq: Two times a day (BID) | SUBCUTANEOUS | 0 refills | Status: DC

## 2019-01-21 NOTE — Progress Notes (Signed)
Julie Osborne , 09-Dec-1950, 68 y.o., female MRN: [id number] Patient Care Team    Relationship Specialty Notifications Start End  Ma Hillock, DO PCP - General Family Medicine  01/13/16     Chief Complaint  Patient presents with  . Diabetes    Pt is doing well with no complaints. Sugars have been running lower since new medication. Pt has eye exam scheduled for November   . Hypertension     Subjective: Pt presents for an OV for routine chronic condition follow up.  Diabetes/morbid obesity:  Pt reportscomplaincewith metformin 1000 mg BID, glipizide 5 mg BID,  and Levemir 40 u BID. Patient denies dizziness, hyperglycemic or hypoglycemic events. Patient denies numbness, tingling in the extremities or nonhealing wounds of feet.  She does have neuropathy of toes and hands on occasions. She has tolerating gabapentin 300/400. Onset: 2005 PNA series:  PSV23 02/2018, prevnar due next visit.  Flu shot: UTD 2020 (recommneded yearly) BMP: 01/2019 WNL Foot exam: 11/2018 Eye exam: 08/09/2017  postponed d/t covid has scheduled A1c: 7.9--> 7.5 --> 6.2 --> 7.6--> 6.4--> 6.8 >> 7.6>>6.8 Microallb: On ARB  HTN/hyperlipidemia: Pt reports  complaints with amlodopine-valsartan-hctz 10-320-25, metoprolol 25 mg daily, Crestor and Brilinta today. Blood pressures ranges at home normal.Patient denies chest pain, shortness of breath, dizziness or lower extremity edema.   Low back pain/hip pain: She states she has seen orthopedic and is getting some relief of her symptoms with injection.  However she would now like to also consider OMT as an option.  She would like a referral to Dr. Tamala Julian.  Depression screen [facility] 2/9 02/26/2018 08/16/2017 08/13/2016 01/13/2016  Decreased Interest 0 0 0 0  Down, Depressed, Hopeless 0 0 0 0  PHQ - 2 Score 0 0 0 0    Allergies  Allergen Reactions  . Statins     Lipitor,simvatatin, pravastatin.welchol cause myalgia   Social History   Social History Narrative   Married to [employer], 2 children.    BS, RN retired now Printmaker.    Drinks caffeine, uses herbal remedies. Daily vitmain use.   Wears her seatbelt, smoke detector in the home.    exercises routinely.    She feels safe in her relationships.    Past Medical History:  Diagnosis Date  . Apnea   . Degenerative arthritis   . Diabetic neuropathy ([place]) 2017  . Diverticulosis 06/19/2013   sigmoid colon on colonoscopy  . Hyperlipidemia 2000  . Hypertension, benign 1996  . Lumbar radiculopathy    Severe L4/L5 radiculopathy by old records.   . Tenosynovitis of ankle 2017   post tibial tendon dysfunction stage 5 (PTTD)  . Uncontrolled diabetes mellitus ([place]) 2005   Past Surgical History:  Procedure Laterality Date  . CORONARY ANGIOPLASTY WITH STENT PLACEMENT  10/17/2018   DES x2/LAD  . EXPLORATORY LAPAROTOMY  1976   endometriosis  . MINOR HEMORRHOIDECTOMY  2006  . OTHER SURGICAL HISTORY Right 1975   venous ligation   Family History  Problem Relation Age of Onset  . COPD Mother   . Diabetes Mellitus II Mother   . Heart failure Mother   . Osteoarthritis Father   . Lung cancer Father 54  . Diabetes Sister   . Diabetes Brother   . Dementia Maternal Grandmother   . Heart disease Maternal Grandmother   . Heart disease Maternal Grandfather   . Stroke Maternal Grandfather   . Heart failure Maternal Aunt   . Lung cancer Paternal Grandfather   .  Lung cancer Paternal Aunt    Allergies as of 01/21/2019      Reactions   Statins    Lipitor,simvatatin, pravastatin.welchol cause myalgia      Medication List       Accurate as of January 21, 2019 11:59 PM. If you have any questions, ask your nurse or doctor.        albuterol 108 (90 Base) MCG/ACT inhaler Commonly known as: VENTOLIN HFA Inhale 1-2 puffs into the lungs every 6 (six) hours as needed for wheezing or shortness of breath.   amLODIPine-Valsartan-HCTZ 10-320-25 MG Tabs Take 1 tablet by mouth daily.   aspirin 81 MG EC tablet  Take by mouth.   calcium citrate-vitamin D 315-200 MG-UNIT tablet Commonly known as: CITRACAL+D Take 1 tablet by mouth daily.   clotrimazole 1 % external solution Commonly known as: LOTRIMIN Apply 1 application topically 2 (two) times daily.   CoQ10 100 MG Caps Take 1 tablet by mouth daily.   diclofenac sodium 1 % Gel Commonly known as: VOLTAREN Apply topically 4 (four) times daily.   docusate sodium 100 MG capsule Commonly known as: COLACE Take 100 mg by mouth 2 (two) times daily.   FIBER-CAPS PO Take by mouth.   fluticasone 50 MCG/ACT nasal spray Commonly known as: FLONASE Place 2 sprays into both nostrils daily.   glipiZIDE 10 MG tablet Commonly known as: GLUCOTROL 1 tab (70m)  in morning, 5 mg (1/2 tab)  at night What changed:   medication strength  how much to take  how to take this  when to take this  additional instructions Changed by: RHoward Pouch DO   glucose blood test strip Commonly known as: OneTouch Verio Test blood sugar before meals and at bedtime   Insulin Detemir 100 UNIT/ML Pen Commonly known as: LEVEMIR Inject 40-50 Units into the skin 2 (two) times daily. What changed: how much to take Changed by: RHoward Pouch DO   Insulin Pen Needle 31G X 5 MM Misc Use as directed   Magnesium 400 MG Caps Take 1 tablet by mouth daily.   metFORMIN 1000 MG tablet Commonly known as: GLUCOPHAGE Take 1 tablet (1,000 mg total) by mouth 2 (two) times daily with a meal.   metoprolol succinate 25 MG 24 hr tablet Commonly known as: TOPROL-XL Take by mouth.   multivitamin capsule Take 1 capsule by mouth daily.   nitroGLYCERIN 0.4 MG SL tablet Commonly known as: NITROSTAT Place under the tongue.   OneTouch Delica Lancets 382LMisc 1 applicator by Does not apply route 4 (four) times daily -  before meals and at bedtime.   rosuvastatin 40 MG tablet Commonly known as: CRESTOR Take 40 mg by mouth daily.   ticagrelor 90 MG Tabs tablet Commonly  known as: BRILINTA Take by mouth.   traMADol 50 MG tablet Commonly known as: ULTRAM TAKE 1 (ONE) TABLET TWO TIMES DAILY, AS NEEDED   VITAMIN B12-FOLIC ACID PO Take 1 tablet by mouth daily.   Vitamin D3 75 MCG (3000 UT) Tabs Take by mouth.   Vitamin K2 100 MCG Caps Take 1 tablet by mouth daily.   Zinc 25 MG Tabs Take 1 tablet by mouth daily.       All past medical history, surgical history, allergies, family history, immunizations andmedications were updated in the EMR today and reviewed under the history and medication portions of their EMR.     ROS: Negative, with the exception of above mentioned in HPI   Objective:  BP 123/73 (  BP Location: Left Arm, Patient Position: Sitting, Cuff Size: Normal)   Pulse (!) 58   Temp 98.7 F (37.1 C) (Temporal)   Resp 17   Ht '5\' 11"'  (1.803 m)   Wt 264 lb 8 oz (120 kg)   SpO2 100%   BMI 36.89 kg/m  Body mass index is 36.89 kg/m. Gen: Afebrile. No acute distress.  Nontoxic in presentation, pleasant, obese Caucasian female. HENT: AT. [place].  Eyes:Pupils Equal Round Reactive to light, Extraocular movements intact,  Conjunctiva without redness, discharge or icterus. Neck/lymp/endocrine: Supple, no lymphadenopathy, no thyromegaly CV: RRR no murmur, no edema, +2/4 P posterior tibialis pulses Chest: CTAB, no wheeze or crackles Abd: Soft.  Obese. NTND. BS present.  No masses palpated.  Skin: No rashes, purpura or petechiae.  Neuro:  Normal gait. PERLA. EOMi. Alert. Oriented x3  Psych: Normal affect, dress and demeanor. Normal speech. Normal thought content and judgment..    No exam data present No results found. No results found for this or any previous visit (from the past 24 hour(s)).  Assessment/Plan: Maalle Starrett is a 68 y.o. female present for OV for  Hypertension, benign/HLD/coronary artery disease - Stable. Continue/refills amlodipine/valsartan/HCTZ combo and metoprolol.  - low sodium, exercise.  -- CBC w/Diff - TSH - Comp  Met (CMET) - Lipid panel - amLODIPine-Valsartan-HCTZ 10-320-25 MG TABS; Take 1 tablet by mouth daily.  Dispense: 90 tablet; Refill: 1  Morbid obesity (HCC)/Other diabetic neurological complication associated with type 2 diabetes mellitus (HCC) Continue Metformin 1000 mg twice daily.  Refills provided for her today. Increase glipizide to 10/5 twice daily Continue Levemir at lower dose 30 mg twice daily.  Taper up or down in accordance to response to higher dose glipizide.  Prescription was printed for her today since she would like to go to a different pharmacy and use her good Rx for this prescription until the new year and new insurance. Continue gabapentin  PNA series:  PSV23 02/2018, prevnar due next visit.  Flu shot: UTD 2020 (recommneded yearly) BMP: 01/2019 WNL Foot exam: 11/2018 Eye exam: 08/09/2017  postponed d/t covid has scheduled A1c: 7.9--> 7.5 --> 6.2 --> 7.6--> 6.4--> 6.8 >> 7.6>>6.8 Microallb: On ARB F/u 4 mos   Lumbar back pain with radiculopathy affecting lower extremity Patient is getting benefit from orthopedic/injections.  She was also like referral to osteopathic doctor for OMM to add to her therapy regimen. - Ambulatory referral to Sports Medicine-Dr. Tamala Julian  Of note patient states she has not heard from pulmonology referral to schedule for evaluation of sleep study.  Referral was placed 12/08/2018.  Will have office the office contact in order to get this scheduled for her.   Reviewed expectations re: course of current medical issues.  Discussed self-management of symptoms.  Outlined signs and symptoms indicating need for more acute intervention.  Patient verbalized understanding and all questions were answered.  Patient received an After-Visit Summary.    Orders Placed This Encounter  Procedures  . CBC w/Diff  . TSH  . Comp Met (CMET)  . Lipid panel  . Ambulatory referral to Sports Medicine  . POCT glycosylated hemoglobin (Hb A1C)     Note is  dictated utilizing voice recognition software. Although note has been proof read prior to signing, occasional typographical errors still can be missed. If any questions arise, please do not hesitate to call for verification.   electronically signed by:  [name], DO  [place]

## 2019-01-21 NOTE — Patient Instructions (Signed)
a1c is 6.8 today. Much improved.  Increase glipizide to 10 mg morning and 5 mg afternoon. New bottle will be higher dose.   Monitor sugars and cut back to 30 units BID when start higher dose glipizide and taper up/down by sugar level (goal 100- fasting).

## 2019-01-22 ENCOUNTER — Telehealth: Payer: Self-pay | Admitting: Family Medicine

## 2019-01-22 ENCOUNTER — Encounter: Payer: Self-pay | Admitting: Family Medicine

## 2019-01-22 NOTE — Telephone Encounter (Signed)
Can you please look into this and see where the disconnect is happening?   Thanks

## 2019-01-22 NOTE — Telephone Encounter (Signed)
She is scheduled for 02/11/19 at [facility].  I will contact patient and go over appt details.

## 2019-01-22 NOTE — Telephone Encounter (Signed)
Patient was referred to pulmonology August 31/2020 and she has not heard from that office to schedule.  She referred to consider sleep study evaluation. Please check into this referral and expedite her scheduling thank you.

## 2019-01-29 ENCOUNTER — Institutional Professional Consult (permissible substitution): Payer: 59 | Admitting: Pulmonary Disease

## 2019-02-11 ENCOUNTER — Other Ambulatory Visit: Payer: Self-pay

## 2019-02-11 ENCOUNTER — Ambulatory Visit (INDEPENDENT_AMBULATORY_CARE_PROVIDER_SITE_OTHER): Payer: Medicare Other | Admitting: Pulmonary Disease

## 2019-02-11 ENCOUNTER — Encounter: Payer: Self-pay | Admitting: Pulmonary Disease

## 2019-02-11 VITALS — BP 134/76 | HR 77 | Temp 97.2°F | Ht 71.0 in | Wt 268.6 lb

## 2019-02-11 DIAGNOSIS — I2510 Atherosclerotic heart disease of native coronary artery without angina pectoris: Secondary | ICD-10-CM | POA: Diagnosis not present

## 2019-02-11 DIAGNOSIS — G4733 Obstructive sleep apnea (adult) (pediatric): Secondary | ICD-10-CM | POA: Diagnosis not present

## 2019-02-11 DIAGNOSIS — Z9861 Coronary angioplasty status: Secondary | ICD-10-CM

## 2019-02-11 NOTE — Progress Notes (Signed)
Subjective:     Patient ID: Julie Osborne, female   DOB: May 30, 1950, 68 y.o.   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  She has insulin requiring diabetes and underwent PCI with stent to LAD in July 2020.  She was advised evaluation for OSA due to obesity and "thick neck".  She smoked half pack per day for about 26 years before she quit in her late 62s Epworth sleepiness score is 3 and she denies excessive daytime somnolence.  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 Bedtime is between 10 and 11 PM, sleep latency is minimal, she sleeps on her right side with 1 pillow, reports 1-2 nocturnal awakenings due to nocturia and is out of bed by 6:30 AM feeling rested with occasional dryness of mouth and headaches.  She denies naps on weekends There is no history suggestive of cataplexy, sleep paralysis or parasomnias  Mild snoring has been noted by family members but no witnessed apneas.  She denies gasping or choking episodes that wake her up from sleep. She has lost 50 pounds over the last 3 years.  She drinks 16 ounces coffee in the mornings and some tea through the day  I reviewed-PCP notes and cardiology note, Epworth sleepiness score  Past Medical History:  Diagnosis Date  . Apnea   . Degenerative arthritis   . Diabetic neuropathy ([place]) 2017  . Diverticulosis 06/19/2013   sigmoid colon on colonoscopy  . Hyperlipidemia 2000  . Hypertension, benign 1996  . Lumbar radiculopathy    Severe L4/L5 radiculopathy by old records.   . Tenosynovitis of ankle 2017   post tibial tendon dysfunction stage 5 (PTTD)  . Uncontrolled diabetes mellitus ([place]) 2005    Past Surgical History:  Procedure Laterality Date  . CORONARY ANGIOPLASTY WITH STENT PLACEMENT  10/17/2018   DES x2/LAD  . EXPLORATORY LAPAROTOMY  1976   endometriosis   . MINOR HEMORRHOIDECTOMY  2006  . OTHER SURGICAL HISTORY Right 1975   venous ligation   Allergies  Allergen Reactions  . Statins     Lipitor,simvatatin, pravastatin.welchol cause myalgia    Social History   Socioeconomic History  . Marital status: Married    Spouse name: Octavia Bruckner  . Number of children: 2  . Years of education: 51  . Highest education level: Not on file  Occupational History  . Occupation: Pharmacist, hospital  . Occupation: retired Marine scientist  Social Needs  . Financial resource strain: Not on file  . Food insecurity    Worry: Not on file    Inability: Not on file  . Transportation needs    Medical: Not on file    Non-medical: Not on file  Tobacco Use  . Smoking status: Former Smoker    Types: Cigarettes  . Smokeless tobacco: Never Used  Substance and Sexual Activity  . Alcohol use: No  . Drug use: No  . Sexual activity: Yes    Partners: Male    Birth control/protection: None    Comment: Married  Lifestyle  . Physical activity    Days per week: Not on file    Minutes per session: Not on file  . Stress: Not on file  Relationships  . Social Herbalist on phone: Not on file    Gets together: Not on file    Attends religious service: Not  on file    Active member of club or organization: Not on file    Attends meetings of clubs or organizations: Not on file    Relationship status: Not on file  . Intimate partner violence    Fear of current or ex partner: Not on file    Emotionally abused: Not on file    Physically abused: Not on file    Forced sexual activity: Not on file  Other Topics Concern  . Not on file  Social History Narrative   Married to [employer], 2 children.    BS, RN retired now Printmaker.    Drinks caffeine, uses herbal remedies. Daily vitmain use.   Wears her seatbelt, smoke detector in the home.    exercises routinely.    She feels safe in her relationships.       Family History  Problem Relation Age of Onset  . COPD Mother   .  Diabetes Mellitus II Mother   . Heart failure Mother   . Osteoarthritis Father   . Lung cancer Father 41  . Diabetes Sister   . Diabetes Brother   . Dementia Maternal Grandmother   . Heart disease Maternal Grandmother   . Heart disease Maternal Grandfather   . Stroke Maternal Grandfather   . Heart failure Maternal Aunt   . Lung cancer Paternal Grandfather   . Lung cancer Paternal Aunt      Review of Systems  Constitutional: Negative.   HENT: Positive for sinus pressure and sinus pain.   Eyes: Negative.   Respiratory: Positive for shortness of breath.   Cardiovascular: Negative.   Gastrointestinal: Positive for constipation.  Endocrine: Negative.   Genitourinary: Negative.   Musculoskeletal: Positive for back pain and neck pain.  Skin: Negative.   Allergic/Immunologic: Negative.   Neurological: Positive for dizziness and headaches.  Hematological: Bruises/bleeds easily.  Psychiatric/Behavioral: Negative.        Objective:   Physical Exam    Gen. Pleasant, obese, in no distress, normal affect ENT - no pallor,icterus, no post nasal drip, class 3 airway, neck size 18 " Neck: No JVD, no thyromegaly, no carotid bruits Lungs: no use of accessory muscles, no dullness to percussion, decreased without rales or rhonchi  Cardiovascular: Rhythm regular, heart sounds  normal, no murmurs or gallops, no peripheral edema Abdomen: soft and non-tender, no hepatosplenomegaly, BS normal. Musculoskeletal: No deformities, no cyanosis or clubbing Neuro:  alert, non focal, no tremors

## 2019-02-11 NOTE — Assessment & Plan Note (Signed)
Given obesity, narrow pharyngeal exam, witnessed apneas &  snoring, obstructive sleep apnea is probable & an overnight polysomnogram will be scheduled as a home study. The pathophysiology of obstructive sleep apnea , it's cardiovascular consequences & modes of treatment including CPAP were discused with the patient in detail & they evidenced understanding.  We also discussed alternative treatments such as oral appliance and hypoglossal nerve stimulation, I doubt she would be a candidate for these

## 2019-02-21 ENCOUNTER — Encounter: Payer: Self-pay | Admitting: Family Medicine

## 2019-02-23 NOTE — Telephone Encounter (Signed)
Pt wrote the following my chart message: I found the most affordable  insulin for my 30 day gap month of December is Curator.  If you remember it worked the same as the [place] had when I had to switch d/t insurance a couple of years ago.  Would it be possible for me to pickup a hard copy prescription for the Basagalar to cover December instead of the [place] you gave me at  office visit in October.  I will continue with the Levemir  in January.  Please advise.

## 2019-02-25 MED ORDER — BASAGLAR KWIKPEN 100 UNIT/ML ~~LOC~~ SOPN: 30.0000 [IU] | PEN_INJECTOR | Freq: Two times a day (BID) | SUBCUTANEOUS | 0 refills | Status: DC

## 2019-02-25 NOTE — Telephone Encounter (Addendum)
I have printed off a prescription for her to pick up.  Please place out from with her name on it.  It is for the [place].  Since I do not know what dose she tapered to with the Levemir after the changes we made her last appointment, I am uncertain as to indicate a particular dose.  Therefore right now for the same 30 mg twice daily but if she is taking less of the Levemir then she needs to take less of the basilar.   -I understand the need for short-term coverage since she is reporting she is  in the donut hole right now.  However in the future, we need to stay on one insulin type, and avoid multiple insulin prescriptions in circulation for safety reasons.

## 2019-02-25 NOTE — Telephone Encounter (Signed)
Pt called back and wanted to know status of RX. Pt has enough to last until the end of the week but is going out of town and does not want to run out of medication

## 2019-02-25 NOTE — Addendum Note (Signed)
Addended by: [name] A on: 02/25/2019 05:16 PM   Modules accepted: Orders

## 2019-02-26 NOTE — Telephone Encounter (Signed)
Pt was sent my chart message with information  

## 2019-02-28 IMAGING — MG DIGITAL SCREENING BILATERAL MAMMOGRAM WITH TOMO AND CAD
8 series · 8 of 24 positions shown · non-contrast
Comparison: Previous exam(s).

CLINICAL DATA: Screening.

EXAM:
DIGITAL SCREENING BILATERAL MAMMOGRAM WITH TOMO AND CAD

[L CC synth-2D]
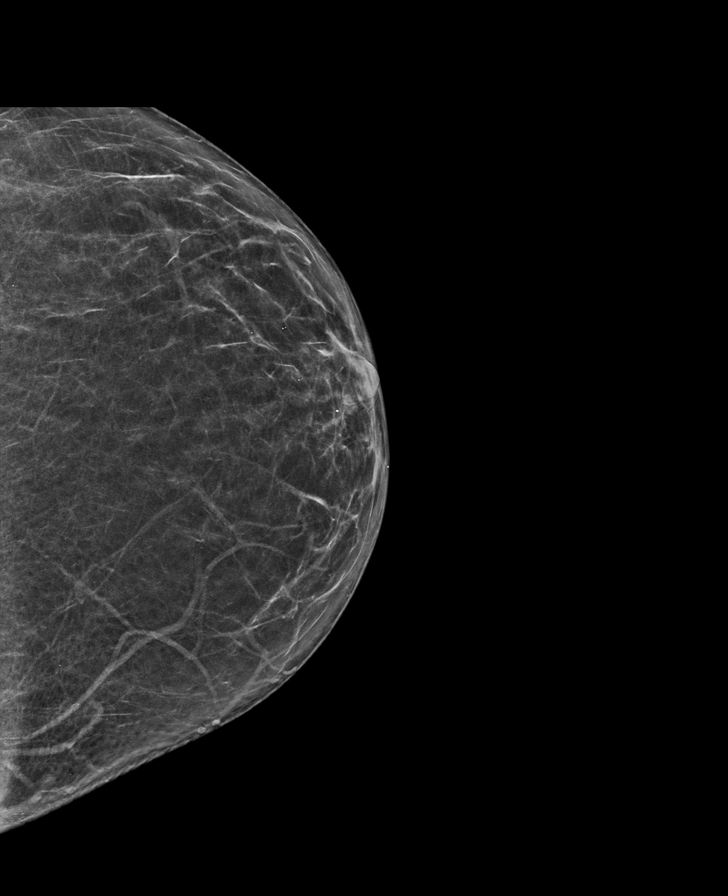

[L MLO synth-2D]
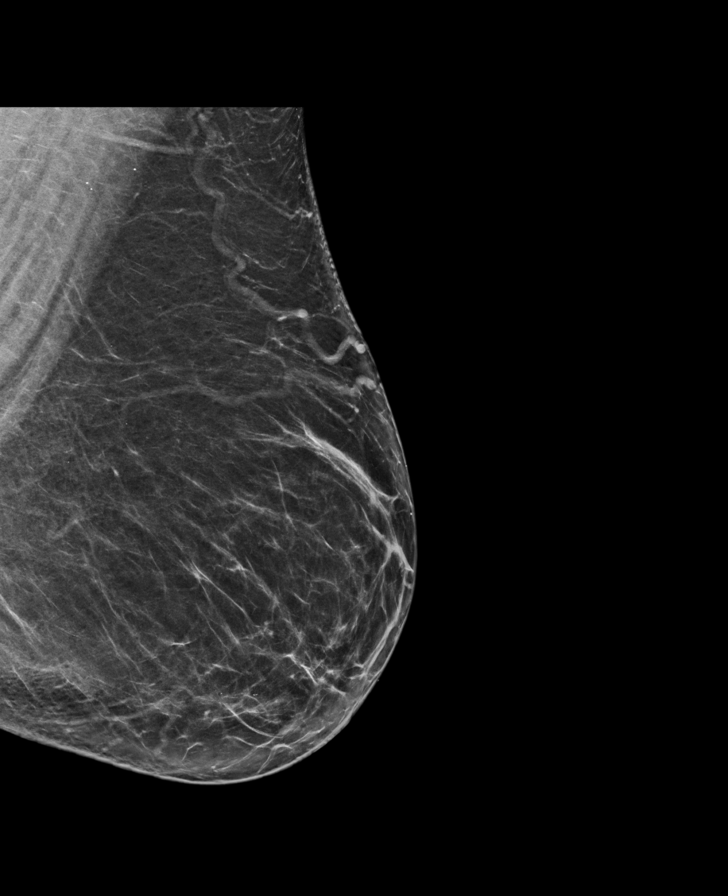

[R MLO synth-2D]
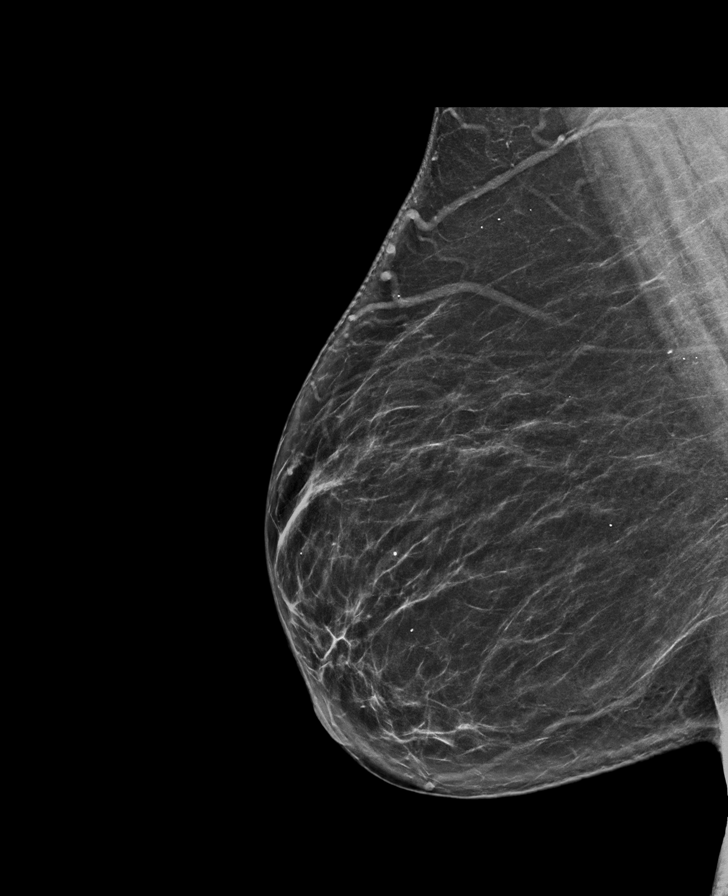

[R CC synth-2D]
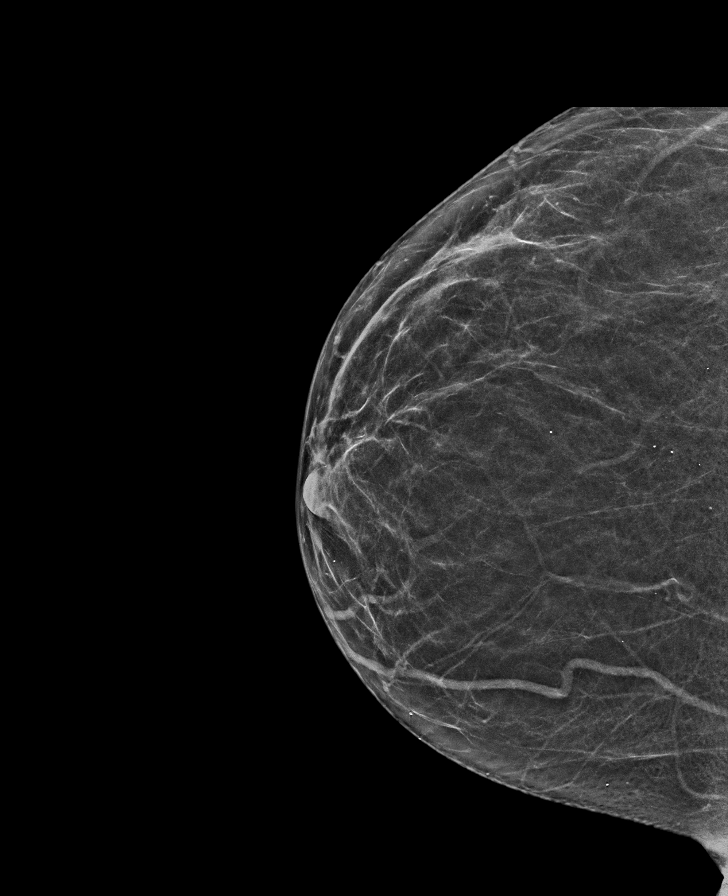

[L CC tomo · tomo slice 33/65.0]
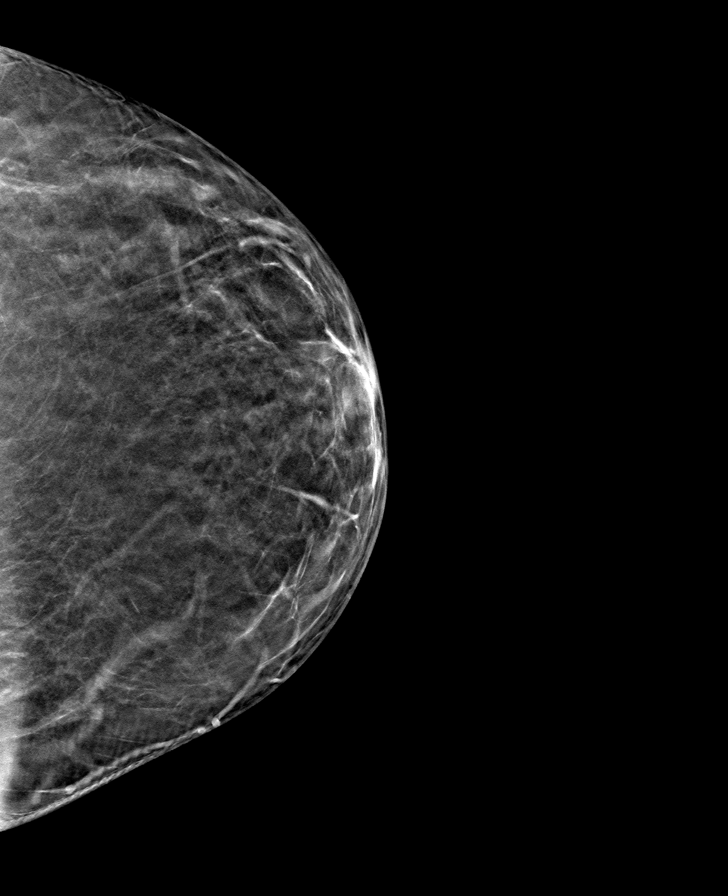

[R CC tomo · tomo slice 33/65.0]
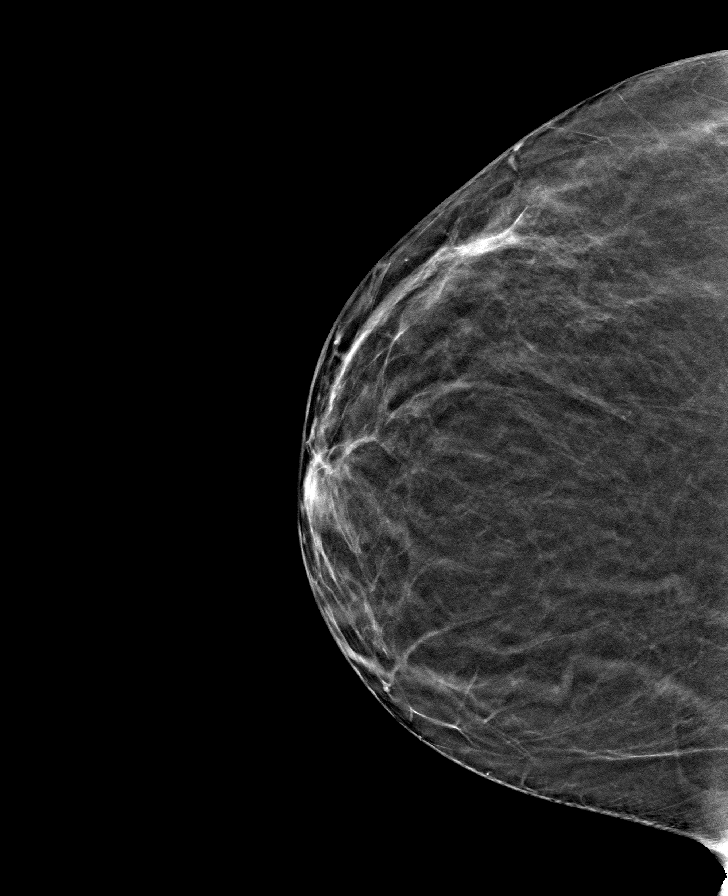

[L MLO tomo · tomo slice 41/82.0]
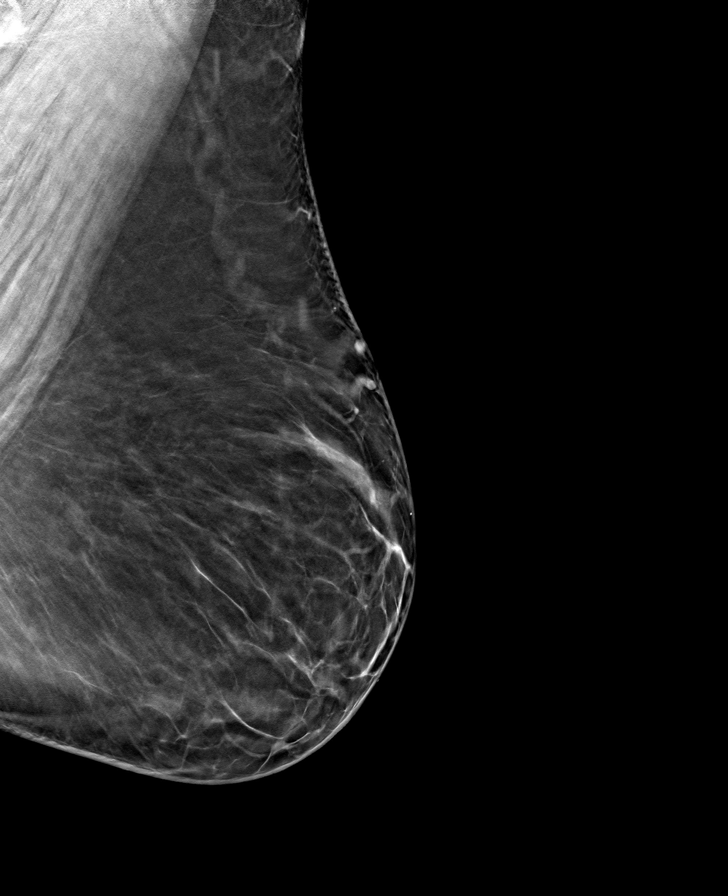

[R MLO tomo · tomo slice 41/82.0]
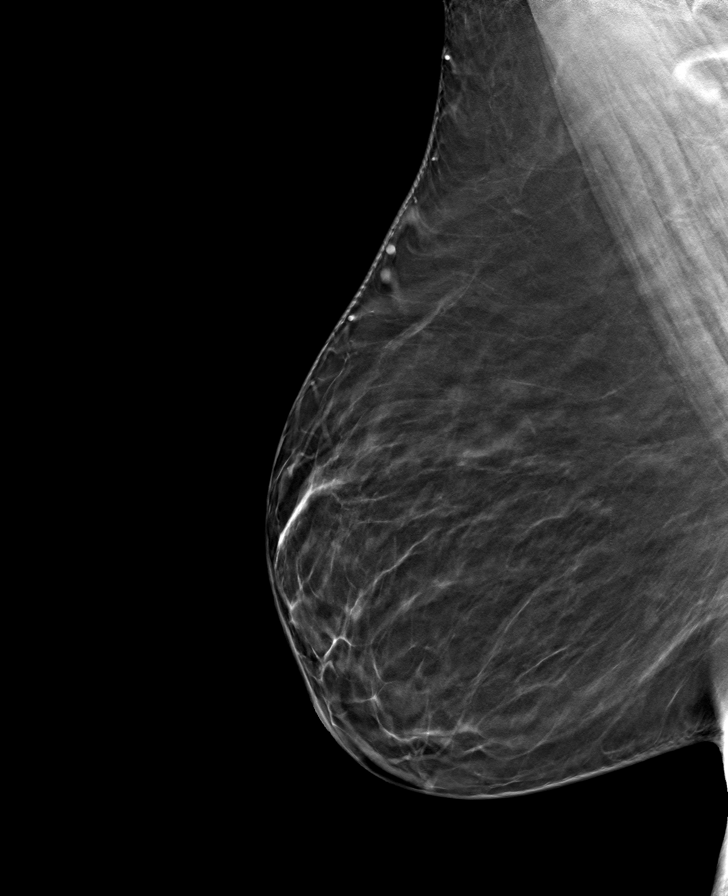

[8 of 24 positions shown; findings below may reference images not displayed]

ACR Breast Density Category b: There are scattered areas of
fibroglandular density.
FINDINGS: There are no findings suspicious for malignancy. Images were
processed with CAD.
IMPRESSION: No mammographic evidence of malignancy. A result letter of this
screening mammogram will be mailed directly to the patient.

RECOMMENDATION:
Screening mammogram in one year. (Code:[id number])

BI-RADS CATEGORY  1: Negative.

## 2019-03-08 NOTE — Progress Notes (Signed)
[facility] [address] [place], [place] [address] Phone: [phone] Subjective:   Julie Osborne, am serving as a scribe for Dr. Hulan Saas.  Patient was referred by Ma Hillock, DO   This visit occurred during the SARS-CoV-2 public health emergency.  Safety protocols were in place, including screening questions prior to the visit, additional usage of staff PPE, and extensive cleaning of exam room while observing appropriate contact time as indicated for disinfecting solutions.    CC: Low back pain  [id number]  Julie Osborne is a 68 y.o. female coming in with complaint of low back pain. Patient states that she has been having left hip pain for years. Has had epidural injections. Last injection helped more than previous injections. Standing increases her pain. Patient states that her pain is in posterior aspect of hip. Uses Tylenol for pain. Has posterior tib dysfunction on left side which will cause her hip to hurt. If her ankle is hurting she will take Tramadol for pain but otherwise does not use medication. 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 Alternates heat and ice when she gets home.    MRI done in January 2020 was independently visualized by me showing severe spinal stenosis at L3-L4 and L4-L5 significant amount of other arthritic changes noted as well.  Appears patient underwent epidurals in February and May 2020     Past Medical History:  Diagnosis Date  . Apnea   . Degenerative arthritis   . Diabetic neuropathy ([place]) 2017  . Diverticulosis 06/19/2013   sigmoid colon on colonoscopy  . Hyperlipidemia 2000  . Hypertension, benign 1996  . Lumbar radiculopathy    Severe L4/L5 radiculopathy by old records.   . Tenosynovitis of ankle 2017   post tibial tendon dysfunction stage 5 (PTTD)  . Uncontrolled diabetes mellitus ([place]) 2005   Past Surgical History:  Procedure Laterality Date  . CORONARY ANGIOPLASTY WITH  STENT PLACEMENT  10/17/2018   DES x2/LAD  . EXPLORATORY LAPAROTOMY  1976   endometriosis  . MINOR HEMORRHOIDECTOMY  2006  . OTHER SURGICAL HISTORY Right 1975   venous ligation   Social History   Socioeconomic History  . Marital status: Married    Spouse name: Octavia Bruckner  . Number of children: 2  . Years of education: 82  . Highest education level: Not on file  Occupational History  . Occupation: Pharmacist, hospital  . Occupation: retired Marine scientist  Social Needs  . Financial resource strain: Not on file  . Food insecurity    Worry: Not on file    Inability: Not on file  . Transportation needs    Medical: Not on file    Non-medical: Not on file  Tobacco Use  . Smoking status: Former Smoker    Types: Cigarettes  . Smokeless tobacco: Never Used  Substance and Sexual Activity  . Alcohol use: Osborne  . Drug use: Osborne  . Sexual activity: Yes    Partners: Male    Birth control/protection: None    Comment: Married  Lifestyle  . Physical activity    Days per week: Not on file    Minutes per session: Not on file  . Stress: Not on file  Relationships  . Social Herbalist on phone: Not on file    Gets together: Not on file    Attends religious service: Not on file    Active member of club or organization: Not on file  Attends meetings of clubs or organizations: Not on file    Relationship status: Not on file  Other Topics Concern  . Not on file  Social History Narrative   Married to [employer], 2 children.    BS, RN retired now Printmaker.    Drinks caffeine, uses herbal remedies. Daily vitmain use.   Wears her seatbelt, smoke detector in the home.    exercises routinely.    She feels safe in her relationships.    Allergies  Allergen Reactions  . Statins     Lipitor,simvatatin, pravastatin.welchol cause myalgia   Family History  Problem Relation Age of Onset  . COPD Mother   . Diabetes Mellitus II Mother   . Heart failure Mother   . Osteoarthritis Father   . Lung cancer Father 27  .  Diabetes Sister   . Diabetes Brother   . Dementia Maternal Grandmother   . Heart disease Maternal Grandmother   . Heart disease Maternal Grandfather   . Stroke Maternal Grandfather   . Heart failure Maternal Aunt   . Lung cancer Paternal Grandfather   . Lung cancer Paternal Aunt     Current Outpatient Medications (Endocrine & Metabolic):  .  glipiZIDE (GLUCOTROL) 10 MG tablet, 1 tab (10mg )  in morning, 5 mg (1/2 tab)  at night .  Insulin Detemir (LEVEMIR) 100 UNIT/ML Pen, Inject 40-50 Units into the skin 2 (two) times daily. .  Insulin Glargine (BASAGLAR KWIKPEN) 100 UNIT/ML SOPN, Inject 0.3 mLs (30 Units total) into the skin 2 (two) times daily. .  metFORMIN (GLUCOPHAGE) 1000 MG tablet, Take 1 tablet (1,000 mg total) by mouth 2 (two) times daily with a meal.  Current Outpatient Medications (Cardiovascular):  .  amLODIPine-Valsartan-HCTZ 10-320-25 MG TABS, Take 1 tablet by mouth daily. .  metoprolol succinate (TOPROL-XL) 25 MG 24 hr tablet, Take by mouth. .  nitroGLYCERIN (NITROSTAT) 0.4 MG SL tablet, Place under the tongue. .  rosuvastatin (CRESTOR) 40 MG tablet, Take 40 mg by mouth daily.  Current Outpatient Medications (Respiratory):  .  albuterol (PROVENTIL HFA;VENTOLIN HFA) 108 (90 Base) MCG/ACT inhaler, Inhale 1-2 puffs into the lungs every 6 (six) hours as needed for wheezing or shortness of breath. .  fluticasone (FLONASE) 50 MCG/ACT nasal spray, Place 2 sprays into both nostrils daily.  Current Outpatient Medications (Analgesics):  .  aspirin 81 MG EC tablet, Take by mouth. .  traMADol (ULTRAM) 50 MG tablet, TAKE 1 (ONE) TABLET TWO TIMES DAILY, AS NEEDED  Current Outpatient Medications (Hematological):  Marland Kitchen  Cobalamin Combinations (VITAMIN B12-FOLIC ACID PO), Take 1 tablet by mouth daily. .  ticagrelor (BRILINTA) 90 MG TABS tablet, Take by mouth.  Current Outpatient Medications (Other):  .  calcium citrate-vitamin D (CITRACAL+D) 315-200 MG-UNIT tablet, Take 1 tablet by  mouth daily. .  Calcium Polycarbophil (FIBER-CAPS PO), Take by mouth. .  Cholecalciferol (VITAMIN D3) 75 MCG (3000 UT) TABS, Take by mouth. .  clotrimazole (LOTRIMIN) 1 % external solution, Apply 1 application topically 2 (two) times daily. .  Coenzyme Q10 (COQ10) 100 MG CAPS, Take 1 tablet by mouth daily. .  diclofenac sodium (VOLTAREN) 1 % GEL, Apply topically 4 (four) times daily. Marland Kitchen  docusate sodium (COLACE) 100 MG capsule, Take 100 mg by mouth 2 (two) times daily. Marland Kitchen  glucose blood (ONETOUCH VERIO) test strip, Test blood sugar before meals and at bedtime .  Insulin Pen Needle 31G X 5 MM MISC, Use as directed .  Magnesium 400 MG CAPS, Take 1 tablet by  mouth daily. .  Menaquinone-7 (VITAMIN K2) 100 MCG CAPS, Take 1 tablet by mouth daily. .  Multiple Vitamin (MULTIVITAMIN) capsule, Take 1 capsule by mouth daily. Glory Rosebush DELICA LANCETS [id number] MISC, 1 applicator by Does not apply route 4 (four) times daily -  before meals and at bedtime. .  Zinc 25 MG TABS, Take 1 tablet by mouth daily. Marland Kitchen  gabapentin (NEURONTIN) 100 MG capsule, Take 2 capsules (200 mg total) by mouth at bedtime.    Past medical history, social, surgical and family history all reviewed in electronic medical record.  Osborne pertanent information unless stated regarding to the chief complaint.   Review of Systems:  Osborne headache, visual changes, nausea, vomiting, diarrhea, constipation, dizziness, abdominal pain, skin rash, fevers, chills, night sweats, weight loss, swollen lymph nodes, body aches, joint swelling, muscle aches, chest pain, shortness of breath, mood changes.   Objective  Blood pressure 132/70, pulse 85, height 5\' 11"  (1.803 m), weight 271 lb (122.9 kg), SpO2 99 %.    General: Osborne apparent distress alert and oriented x3 mood and affect normal, dressed appropriately.  HEENT: Pupils equal, extraocular movements intact  Respiratory: Patient's speak in full sentences and does not appear short of breath  Cardiovascular:  Osborne lower extremity edema, non tender, Osborne erythema  Skin: Warm dry intact with Osborne signs of infection or rash on extremities or on axial skeleton.  Abdomen: Soft nontender  Neuro: Cranial nerves II through XII are intact, neurovascularly intact in all extremities with 2+ DTRs and 2+ pulses.  Lymph: Osborne lymphadenopathy of posterior or anterior cervical chain or axillae bilaterally.  Gait normal with good balance and coordination.  MSK:  Non tender with full range of motion and good stability and symmetric strength and tone of shoulders, elbows, wrist, hip, knee and ankles bilaterally.  Back Exam:  Inspection: loss of lordosis  Motion: Flexion 40 deg, Extension 25 deg, Side Bending to 35 deg bilaterally,  Rotation to 35 deg bilaterally  SLR laying: Negative  XSLR laying: Negative  Palpable tenderness: TTP SI joint on the right side. Marland Kitchen FABER: tightness . Sensory change: Gross sensation intact to all lumbar and sacral dermatomes.  Reflexes: 2+ at both patellar tendons, 2+ at achilles tendons, Babinski's downgoing.  Strength at foot  Plantar-flexion: 5/5 Dorsi-flexion: 5/5 Eversion: 5/5 Inversion: 5/5  Leg strength  Quad: 5/5 Hamstring: 5/5 Hip flexor: 5/5 Hip abductors: 5/5     Impression and Recommendations:     This case required medical decision making of moderate complexity. The above documentation has been reviewed and is accurate and complete Lyndal Pulley, DO       Note: This dictation was prepared with Dragon dictation along with smaller phrase technology. Any transcriptional errors that result from this process are unintentional.

## 2019-03-09 ENCOUNTER — Other Ambulatory Visit: Payer: Self-pay

## 2019-03-09 ENCOUNTER — Ambulatory Visit (INDEPENDENT_AMBULATORY_CARE_PROVIDER_SITE_OTHER): Payer: Medicare Other | Admitting: Family Medicine

## 2019-03-09 ENCOUNTER — Encounter: Payer: Self-pay | Admitting: Family Medicine

## 2019-03-09 DIAGNOSIS — M48061 Spinal stenosis, lumbar region without neurogenic claudication: Secondary | ICD-10-CM | POA: Diagnosis not present

## 2019-03-09 DIAGNOSIS — Z9861 Coronary angioplasty status: Secondary | ICD-10-CM

## 2019-03-09 DIAGNOSIS — I2510 Atherosclerotic heart disease of native coronary artery without angina pectoris: Secondary | ICD-10-CM | POA: Diagnosis not present

## 2019-03-09 MED ORDER — GABAPENTIN 100 MG PO CAPS: 200.0000 mg | ORAL_CAPSULE | Freq: Every day | ORAL | 3 refills | Status: DC

## 2019-03-09 NOTE — Patient Instructions (Addendum)
Good to see you.  Ice 20 minutes 2 times daily. Usually after activity and before bed. Exercises 3 times a week.  pennsaid pinkie amount topically 2 times daily as needed.  Gabapentin 200 mg at night   Turmeric 500mg  daily  Tart cherry extract 1200mg  at night Vitamin D 2000 IU daily  Gravity defyer Spenco orthotics "total support" See me again in 4-6 weeks   [address] [place], [place] [address] Phone [phone]

## 2019-03-10 ENCOUNTER — Encounter: Payer: Self-pay | Admitting: Family Medicine

## 2019-03-10 NOTE — Assessment & Plan Note (Signed)
Likely worsening and causing the pain. Discussed HEP, Discussed avoid extension  pennsaid pinkie amount topically 2 times daily as needed.  Ice 20 minutes 2 times daily. Usually after activity and before bed.Discussed possible injections. Gabapentin prescribed  RTC in 4 weeks

## 2019-03-31 ENCOUNTER — Other Ambulatory Visit: Payer: Self-pay

## 2019-03-31 ENCOUNTER — Ambulatory Visit: Payer: Medicare Other

## 2019-03-31 DIAGNOSIS — G4733 Obstructive sleep apnea (adult) (pediatric): Secondary | ICD-10-CM

## 2019-04-01 ENCOUNTER — Telehealth: Payer: Self-pay | Admitting: Pulmonary Disease

## 2019-04-01 DIAGNOSIS — G4733 Obstructive sleep apnea (adult) (pediatric): Secondary | ICD-10-CM | POA: Diagnosis not present

## 2019-04-01 NOTE — Telephone Encounter (Signed)
Called the patient and made her aware of the results. Patient voiced understanding and has been scheduled for a follow up appointment on 05/14/18 at 9:30. Nothing further needed at this time.

## 2019-04-01 NOTE — Telephone Encounter (Signed)
Home sleep test showed mild OSA, AHI 7/hour  The cardiac implications of such mild OSA is minimal  Treatment options would include weight loss alone and avoid sleeping in supine position -OR dental appliance OR CPAP therapy  Schedule office visit in 6 weeks with APP

## 2019-04-02 DIAGNOSIS — G4733 Obstructive sleep apnea (adult) (pediatric): Secondary | ICD-10-CM | POA: Diagnosis not present

## 2019-04-14 ENCOUNTER — Other Ambulatory Visit: Payer: Self-pay | Admitting: Family Medicine

## 2019-04-17 ENCOUNTER — Ambulatory Visit: Payer: Medicare Other | Admitting: Family Medicine

## 2019-04-23 ENCOUNTER — Ambulatory Visit: Payer: Medicare Other | Admitting: Family Medicine

## 2019-05-15 ENCOUNTER — Ambulatory Visit: Payer: Medicare Other | Admitting: Pulmonary Disease

## 2019-05-16 ENCOUNTER — Encounter: Payer: Self-pay | Admitting: Family Medicine

## 2019-05-18 ENCOUNTER — Other Ambulatory Visit: Payer: Self-pay

## 2019-05-18 ENCOUNTER — Ambulatory Visit (INDEPENDENT_AMBULATORY_CARE_PROVIDER_SITE_OTHER): Payer: Medicare Other | Admitting: Family Medicine

## 2019-05-18 ENCOUNTER — Encounter: Payer: Self-pay | Admitting: Family Medicine

## 2019-05-18 DIAGNOSIS — M48061 Spinal stenosis, lumbar region without neurogenic claudication: Secondary | ICD-10-CM

## 2019-05-18 MED ORDER — DULOXETINE HCL 20 MG PO CPEP: 20.0000 mg | ORAL_CAPSULE | Freq: Every day | ORAL | 3 refills | Status: DC

## 2019-05-18 NOTE — Progress Notes (Signed)
[place] Phone: [phone] Subjective:   [name], am serving as a scribe for Dr. Hulan Saas. This visit occurred during the SARS-CoV-2 public health emergency.  Safety protocols were in place, including screening questions prior to the visit, additional usage of staff PPE, and extensive cleaning of exam room while observing appropriate contact time as indicated for disinfecting solutions.   I'm seeing this patient by the request  of:  Kuneff, Renee A, DO  CC: Low back pain follow-up  [id number]   03/09/2019 Likely worsening and causing the pain. Discussed HEP, Discussed avoid extension  pennsaid pinkie amount topically 2 times daily as needed.  Ice 20 minutes 2 times daily. Usually after activity and before bed.Discussed possible injections. Gabapentin prescribed  RTC in 4 weeks   Update 05/18/2019 Julie Osborne is a 69 y.o. female coming in with complaint of back pain. Patient states that she was using gabapentin but has discontinued use. Has been using stretches to minimize pain. Did get Gravity Defyr shoes which has helped. Has been using Pennsaid.    Previous imaging that was independently visualized by me today including patient's MRI of the lumbar spine on April 30, 2018.  Found to have moderate to severe spinal stenosis mostly from L3-L5 with severe right-sided L3-L5 nerve impingement specifically left L4 and likely right L3 nerve impingement noted.  Patient had undergone an epidural injection May 13, 2018.  Past Medical History:  Diagnosis Date  . Apnea   . Degenerative arthritis   . Diabetic neuropathy ([place]) 2017  . Diverticulosis 06/19/2013   sigmoid colon on colonoscopy  . Hyperlipidemia 2000  . Hypertension, benign 1996  . Lumbar radiculopathy    Severe L4/L5 radiculopathy by old records.   . Tenosynovitis of ankle 2017   post tibial tendon dysfunction stage 5 (PTTD)  .  Uncontrolled diabetes mellitus ([place]) 2005   Past Surgical History:  Procedure Laterality Date  . CORONARY ANGIOPLASTY WITH STENT PLACEMENT  10/17/2018   DES x2/LAD  . EXPLORATORY LAPAROTOMY  1976   endometriosis  . MINOR HEMORRHOIDECTOMY  2006  . OTHER SURGICAL HISTORY Right 1975   venous ligation   Social History   Socioeconomic History  . Marital status: Married    Spouse name: Octavia Bruckner  . Number of children: 2  . Years of education: 24  . Highest education level: Not on file  Occupational History  . Occupation: Pharmacist, hospital  . Occupation: retired Marine scientist  Tobacco Use  . Smoking status: Former Smoker    Types: Cigarettes  . Smokeless tobacco: Never Used  Substance and Sexual Activity  . Alcohol use: No  . Drug use: No  . Sexual activity: Yes    Partners: Male    Birth control/protection: None    Comment: Married  Other Topics Concern  . Not on file  Social History Narrative   Married to [employer], 2 children.    BS, RN retired now Printmaker.    Drinks caffeine, uses herbal remedies. Daily vitmain use.   Wears her seatbelt, smoke detector in the home.    exercises routinely.    She feels safe in her relationships.    Social Determinants of Health   Financial Resource Strain:   . Difficulty of Paying Living Expenses: Not on file  Food Insecurity:   . Worried About Charity fundraiser in the Last Year: Not on file  . Ran Out of Food in the Last Year:  Not on file  Transportation Needs:   . Lack of Transportation (Medical): Not on file  . Lack of Transportation (Non-Medical): Not on file  Physical Activity:   . Days of Exercise per Week: Not on file  . Minutes of Exercise per Session: Not on file  Stress:   . Feeling of Stress : Not on file  Social Connections:   . Frequency of Communication with Friends and Family: Not on file  . Frequency of Social Gatherings with Friends and Family: Not on file  . Attends Religious Services: Not on file  . Active Member of Clubs or  Organizations: Not on file  . Attends Archivist Meetings: Not on file  . Marital Status: Not on file   Allergies  Allergen Reactions  . Statins     Lipitor,simvatatin, pravastatin.welchol cause myalgia   Family History  Problem Relation Age of Onset  . COPD Mother   . Diabetes Mellitus II Mother   . Heart failure Mother   . Osteoarthritis Father   . Lung cancer Father 1  . Diabetes Sister   . Diabetes Brother   . Dementia Maternal Grandmother   . Heart disease Maternal Grandmother   . Heart disease Maternal Grandfather   . Stroke Maternal Grandfather   . Heart failure Maternal Aunt   . Lung cancer Paternal Grandfather   . Lung cancer Paternal Aunt     Current Outpatient Medications (Endocrine & Metabolic):  .  glipiZIDE (GLUCOTROL) 10 MG tablet, 1 tab (10mg )  in morning, 5 mg (1/2 tab)  at night .  Insulin Detemir (LEVEMIR) 100 UNIT/ML Pen, Inject 40-50 Units into the skin 2 (two) times daily. .  metFORMIN (GLUCOPHAGE) 1000 MG tablet, Take 1 tablet (1,000 mg total) by mouth 2 (two) times daily with a meal. .  Insulin Glargine (BASAGLAR KWIKPEN) 100 UNIT/ML SOPN, Inject 0.3 mLs (30 Units total) into the skin 2 (two) times daily.  Current Outpatient Medications (Cardiovascular):  .  amLODIPine-Valsartan-HCTZ 10-320-25 MG TABS, Take 1 tablet by mouth daily. .  metoprolol succinate (TOPROL-XL) 25 MG 24 hr tablet, Take by mouth. .  nitroGLYCERIN (NITROSTAT) 0.4 MG SL tablet, Place under the tongue. .  rosuvastatin (CRESTOR) 40 MG tablet, Take 40 mg by mouth daily.  Current Outpatient Medications (Respiratory):  .  albuterol (PROVENTIL HFA;VENTOLIN HFA) 108 (90 Base) MCG/ACT inhaler, Inhale 1-2 puffs into the lungs every 6 (six) hours as needed for wheezing or shortness of breath. .  fluticasone (FLONASE) 50 MCG/ACT nasal spray, Place 2 sprays into both nostrils daily.  Current Outpatient Medications (Analgesics):  .  aspirin 81 MG EC tablet, Take by mouth. .   traMADol (ULTRAM) 50 MG tablet, TAKE 1 (ONE) TABLET TWO TIMES DAILY, AS NEEDED  Current Outpatient Medications (Hematological):  Marland Kitchen  Cobalamin Combinations (VITAMIN B12-FOLIC ACID PO), Take 1 tablet by mouth daily. .  ticagrelor (BRILINTA) 90 MG TABS tablet, Take by mouth.  Current Outpatient Medications (Other):  .  calcium citrate-vitamin D (CITRACAL+D) 315-200 MG-UNIT tablet, Take 1 tablet by mouth daily. .  Calcium Polycarbophil (FIBER-CAPS PO), Take by mouth. .  Cholecalciferol (VITAMIN D3) 75 MCG (3000 UT) TABS, Take by mouth. .  clotrimazole (LOTRIMIN) 1 % external solution, Apply 1 application topically 2 (two) times daily. .  Coenzyme Q10 (COQ10) 100 MG CAPS, Take 1 tablet by mouth daily. .  diclofenac sodium (VOLTAREN) 1 % GEL, Apply topically 4 (four) times daily. Marland Kitchen  docusate sodium (COLACE) 100 MG capsule, Take 100 mg  by mouth 2 (two) times daily. .  DULoxetine (CYMBALTA) 20 MG capsule, Take 1 capsule (20 mg total) by mouth daily. Marland Kitchen  glucose blood (ONETOUCH VERIO) test strip, Test blood sugar before meals and at bedtime .  Insulin Pen Needle 31G X 5 MM MISC, Use as directed .  Magnesium 400 MG CAPS, Take 1 tablet by mouth daily. .  Menaquinone-7 (VITAMIN K2) 100 MCG CAPS, Take 1 tablet by mouth daily. .  Multiple Vitamin (MULTIVITAMIN) capsule, Take 1 capsule by mouth daily. Glory Rosebush DELICA LANCETS 99991111 MISC, 1 applicator by Does not apply route 4 (four) times daily -  before meals and at bedtime. .  Zinc 25 MG TABS, Take 1 tablet by mouth daily.   Reviewed prior external information including notes and imaging from  primary care provider As well as notes that were available from care everywhere and other healthcare systems.  Past medical history, social, surgical and family history all reviewed in electronic medical record.  No pertanent information unless stated regarding to the chief complaint.   Review of Systems:  No headache, visual changes, nausea, vomiting,  diarrhea, constipation, dizziness, abdominal pain, skin rash, fevers, chills, night sweats, weight loss, swollen lymph nodes, body aches, joint swelling, chest pain, shortness of breath, mood changes. POSITIVE muscle aches  Objective  Blood pressure 126/82, pulse 78, height 5\' 11"  (1.803 m), weight 273 lb (123.8 kg), SpO2 99 %.   General: No apparent distress alert and oriented x3 mood and affect normal, dressed appropriately.  HEENT: Pupils equal, extraocular movements intact  Respiratory: Patient's speak in full sentences and does not appear short of breath  Cardiovascular: No lower extremity edema, non tender, no erythema  Skin: Warm dry intact with no signs of infection or rash on extremities or on axial skeleton.  Abdomen: Soft nontender  Neuro: Cranial nerves II through XII are intact, neurovascularly intact in all extremities with 2+ DTRs and 2+ pulses.  Lymph: No lymphadenopathy of posterior or anterior cervical chain or axillae bilaterally.  Gait normal with good balance and coordination.  MSK:  Non tender with full range of motion and good stability and symmetric strength and tone of shoulders, elbows, wrist, hip, knee and ankles bilaterally.  Back Exam:  Inspection: Mild loss of lordosis with poor core strength Motion: Flexion 45 deg, Extension 25 deg, Side Bending to 45 deg bilaterally,  Rotation to 45 deg bilaterally  SLR laying: Negative tightness of the hamstrings bilaterally XSLR laying: Negative  Palpable tenderness: Tender to palpation mostly around the sacroiliac joints bilaterally right greater than left. FABER: Positive Faber bilaterally. Sensory change: Gross sensation intact to all lumbar and sacral dermatomes.  Reflexes: 2+ at both patellar tendons, 2+ at achilles tendons, Babinski's downgoing.  Strength at foot  Plantar-flexion: 5/5 Dorsi-flexion: 5/5 Eversion: 5/5 Inversion: 5/5  Leg strength  Quad: 5/5 Hamstring: 5/5 Hip flexor: 5/5 Hip abductors: 5/5       Impression and Recommendations:     This case required medical decision making of moderate complexity. The above documentation has been reviewed and is accurate and complete Lyndal Pulley, DO       Note: This dictation was prepared with Dragon dictation along with smaller phrase technology. Any transcriptional errors that result from this process are unintentional.

## 2019-05-18 NOTE — Patient Instructions (Signed)
Great to see you  congrats! Keep it up  cymbalta 20 mg daily  Send update in  3 weeks Have an appointment set up in 8 weeks just in case but if doing well see me when you need me!

## 2019-05-18 NOTE — Assessment & Plan Note (Addendum)
Reviewed with patient again about the patient's imaging and the severity of patient's back.  Patient knows making progress with the over-the-counter medications, icing regimen, which activities to do which wants to avoid.  Patient is to increase activity slowly over the course the next several weeks.  Discussed icing regimen.  We discussed Cymbalta at a low dose that could help with some of the nerve pain and help with some of the endurance during the day.  Follow-up with me again 8 weeks otherwise.  Chronic problem that is stable.

## 2019-06-08 ENCOUNTER — Ambulatory Visit (INDEPENDENT_AMBULATORY_CARE_PROVIDER_SITE_OTHER): Payer: Medicare Other | Admitting: Family Medicine

## 2019-06-08 ENCOUNTER — Encounter: Payer: Self-pay | Admitting: Family Medicine

## 2019-06-08 ENCOUNTER — Other Ambulatory Visit: Payer: Self-pay

## 2019-06-08 VITALS — BP 156/76 | HR 68 | Temp 97.6°F | Resp 18 | Ht 71.0 in | Wt 271.4 lb

## 2019-06-08 DIAGNOSIS — E1149 Type 2 diabetes mellitus with other diabetic neurological complication: Secondary | ICD-10-CM

## 2019-06-08 DIAGNOSIS — E1140 Type 2 diabetes mellitus with diabetic neuropathy, unspecified: Secondary | ICD-10-CM

## 2019-06-08 DIAGNOSIS — I10 Essential (primary) hypertension: Secondary | ICD-10-CM

## 2019-06-08 DIAGNOSIS — Z23 Encounter for immunization: Secondary | ICD-10-CM | POA: Diagnosis not present

## 2019-06-08 DIAGNOSIS — Z794 Long term (current) use of insulin: Secondary | ICD-10-CM | POA: Diagnosis not present

## 2019-06-08 LAB — POCT GLYCOSYLATED HEMOGLOBIN (HGB A1C)
HbA1c POC (<> result, manual entry): 7.9 % (ref 4.0–5.6)
HbA1c, POC (controlled diabetic range): 7.9 % — AB (ref 0.0–7.0)
HbA1c, POC (prediabetic range): 7.9 % — AB (ref 5.7–6.4)
Hemoglobin A1C: 7.9 % — AB (ref 4.0–5.6)

## 2019-06-08 MED ORDER — AMLODIPINE-VALSARTAN-HCTZ 10-320-25 MG PO TABS: 1.0000 | ORAL_TABLET | Freq: Every day | ORAL | 1 refills | Status: DC

## 2019-06-08 MED ORDER — METOPROLOL SUCCINATE ER 25 MG PO TB24: 25.0000 mg | ORAL_TABLET | Freq: Every day | ORAL | 1 refills | Status: DC

## 2019-06-08 MED ORDER — METFORMIN HCL 1000 MG PO TABS: 1000.0000 mg | ORAL_TABLET | Freq: Two times a day (BID) | ORAL | 1 refills | Status: DC

## 2019-06-08 MED ORDER — INSULIN DETEMIR 100 UNIT/ML FLEXPEN: 40.0000 [IU] | PEN_INJECTOR | Freq: Every day | SUBCUTANEOUS | 5 refills | Status: DC

## 2019-06-08 MED ORDER — ALBUTEROL SULFATE HFA 108 (90 BASE) MCG/ACT IN AERS: 1.0000 | INHALATION_SPRAY | Freq: Four times a day (QID) | RESPIRATORY_TRACT | 1 refills | Status: DC | PRN

## 2019-06-08 MED ORDER — GLIPIZIDE 10 MG PO TABS: ORAL_TABLET | ORAL | 1 refills | Status: DC

## 2019-06-08 NOTE — Addendum Note (Signed)
Addended by: Caroll Rancher L on: 06/08/2019 09:38 AM   Modules accepted: Orders

## 2019-06-08 NOTE — Progress Notes (Signed)
This visit occurred during the SARS-CoV-2 public health emergency.  Safety protocols were in place, including screening questions prior to the visit, additional usage of staff PPE, and extensive cleaning of exam room while observing appropriate contact time as indicated for disinfecting solutions.     Julie Osborne , 02-Jul-1950, 69 y.o., female MRN: [id number] Patient Care Team    Relationship Specialty Notifications Start End  Julie Hillock, DO PCP - General Family Medicine  01/13/16     Chief Complaint  Patient presents with  . Diabetes    Needs refills on medications.   . Hypertension    Pt took BP meds about 30 mins ago.      Subjective: Pt presents for an OV for routine chronic condition follow up.  Diabetes/morbid obesity:  Pt reportscompliancewith metformin 1000 mg BID, glipizide 10/5 mg BID,  and Levemir 45-50 u QHS. Patient denies dizziness, hyperglycemic or hypoglycemic events. Patient denies numbness, tingling in the extremities or nonhealing wounds of feet.  She does have neuropathy of toes and hands on occasions. She has tolerating gabapentin 300/400. Onset: 2005 PNA series:  PSV23 02/2018, prevnar due next visit.  Flu shot: UTD 2020 (recommneded yearly) BMP: 01/2019 WNL Foot exam: 11/2018 Eye exam: 08/09/2017  postponed d/t covid has scheduled A1c: 7.9--> 7.5 --> 6.2 --> 7.6--> 6.4--> 6.8 >> 7.6>>6.8>>7.9 Microallb: On ARB  HTN/hyperlipidemia: Pt reports complaince with amlodopine-valsartan-hctz 10-320-25, metoprolol 25 mg daily, Crestor and Brilinta today. Blood pressures ranges at home <130/80.Patient denies chest pain, shortness of breath, dizziness or lower extremity edema.    Depression screen [facility] 2/9 02/26/2018 08/16/2017 08/13/2016 01/13/2016  Decreased Interest 0 0 0 0  Down, Depressed, Hopeless 0 0 0 0  PHQ - 2 Score 0 0 0 0    Allergies  Allergen Reactions  . Statins     Lipitor,simvatatin, pravastatin.welchol cause myalgia   Social History    Social History Narrative   Married to [employer], 2 children.    BS, RN retired now Printmaker.    Drinks caffeine, uses herbal remedies. Daily vitmain use.   Wears her seatbelt, smoke detector in the home.    exercises routinely.    She feels safe in her relationships.    Past Medical History:  Diagnosis Date  . Apnea   . Degenerative arthritis   . Diabetic neuropathy ([place]) 2017  . Diverticulosis 06/19/2013   sigmoid colon on colonoscopy  . Hyperlipidemia 2000  . Hypertension, benign 1996  . Lumbar radiculopathy    Severe L4/L5 radiculopathy by old records.   . Tenosynovitis of ankle 2017   post tibial tendon dysfunction stage 5 (PTTD)  . Uncontrolled diabetes mellitus ([place]) 2005   Past Surgical History:  Procedure Laterality Date  . CORONARY ANGIOPLASTY WITH STENT PLACEMENT  10/17/2018   DES x2/LAD  . EXPLORATORY LAPAROTOMY  1976   endometriosis  . MINOR HEMORRHOIDECTOMY  2006  . OTHER SURGICAL HISTORY Right 1975   venous ligation   Family History  Problem Relation Age of Onset  . COPD Mother   . Diabetes Mellitus II Mother   . Heart failure Mother   . Osteoarthritis Father   . Lung cancer Father 17  . Diabetes Sister   . Diabetes Brother   . Dementia Maternal Grandmother   . Heart disease Maternal Grandmother   . Heart disease Maternal Grandfather   . Stroke Maternal Grandfather   . Heart failure Maternal Aunt   . Lung cancer Paternal Grandfather   . Lung cancer  Paternal Aunt    Allergies as of 06/08/2019      Reactions   Statins    Lipitor,simvatatin, pravastatin.welchol cause myalgia      Medication List       Accurate as of June 08, 2019  9:31 AM. If you have any questions, ask your nurse or doctor.        STOP taking these medications   Basaglar KwikPen 100 UNIT/ML Sopn Stopped by: [name], DO     TAKE these medications   albuterol 108 (90 Base) MCG/ACT inhaler Commonly known as: VENTOLIN HFA Inhale 1-2 puffs into the lungs every 6 (six)  hours as needed for wheezing or shortness of breath.   amLODIPine-Valsartan-HCTZ 10-320-25 MG Tabs Take 1 tablet by mouth daily.   aspirin 81 MG EC tablet Take by mouth.   calcium citrate-vitamin D 315-200 MG-UNIT tablet Commonly known as: CITRACAL+D Take 1 tablet by mouth daily.   clopidogrel 75 MG tablet Commonly known as: PLAVIX Take by mouth.   clotrimazole 1 % external solution Commonly known as: LOTRIMIN Apply 1 application topically 2 (two) times daily.   CoQ10 100 MG Caps Take 1 tablet by mouth daily.   diclofenac sodium 1 % Gel Commonly known as: VOLTAREN Apply topically 4 (four) times daily.   docusate sodium 100 MG capsule Commonly known as: COLACE Take 100 mg by mouth 2 (two) times daily.   DULoxetine 20 MG capsule Commonly known as: Cymbalta Take 1 capsule (20 mg total) by mouth daily.   FIBER-CAPS PO Take by mouth.   fluticasone 50 MCG/ACT nasal spray Commonly known as: FLONASE Place 2 sprays into both nostrils daily.   glipiZIDE 10 MG tablet Commonly known as: GLUCOTROL 1 tab (10mg )  in morning, 5 mg (1/2 tab)  at night   glucose blood test strip Commonly known as: OneTouch Verio Test blood sugar before meals and at bedtime   Insulin Detemir 100 UNIT/ML Pen Commonly known as: LEVEMIR Inject 40-50 Units into the skin at bedtime. What changed: when to take this Changed by: [name], DO   Insulin Pen Needle 31G X 5 MM Misc Use as directed   Magnesium 400 MG Caps Take 1 tablet by mouth daily.   metFORMIN 1000 MG tablet Commonly known as: GLUCOPHAGE Take 1 tablet (1,000 mg total) by mouth 2 (two) times daily with a meal.   metoprolol succinate 25 MG 24 hr tablet Commonly known as: TOPROL-XL Take 1 tablet (25 mg total) by mouth daily. What changed:   how much to take  when to take this Changed by: [name], DO   multivitamin capsule Take 1 capsule by mouth daily.   nitroGLYCERIN 0.4 MG SL tablet Commonly known as:  NITROSTAT Place under the tongue.   OneTouch Delica Lancets [id number] Misc 1 applicator by Does not apply route 4 (four) times daily -  before meals and at bedtime.   QC Tumeric Complex 500 MG Caps Generic drug: Turmeric Take by mouth.   rosuvastatin 40 MG tablet Commonly known as: CRESTOR Take 40 mg by mouth daily.   TART CHERRY ADVANCED PO Take by mouth.   ticagrelor 90 MG Tabs tablet Commonly known as: BRILINTA Take by mouth.   traMADol 50 MG tablet Commonly known as: ULTRAM TAKE 1 (ONE) TABLET TWO TIMES DAILY, AS NEEDED   VITAMIN B12-FOLIC ACID PO Take 1 tablet by mouth daily.   Vitamin D3 75 MCG (3000 UT) Tabs Take by mouth. 2,000 units daily   Vitamin K2  100 MCG Caps Take 1 tablet by mouth daily.   Zinc 25 MG Tabs Take 1 tablet by mouth daily.       All past medical history, surgical history, allergies, family history, immunizations andmedications were updated in the EMR today and reviewed under the history and medication portions of their EMR.     ROS: Negative, with the exception of above mentioned in HPI   Objective:  BP (!) 156/76 (BP Location: Left Arm, Patient Position: Sitting, Cuff Size: Large)   Pulse 68   Temp 97.6 F (36.4 C) (Temporal)   Resp 18   Ht 5\' 11"  (1.803 m)   Wt 271 lb 6 oz (123.1 kg)   SpO2 98%   BMI 37.85 kg/m  Body mass index is 37.85 kg/m. Gen: Afebrile. No acute distress.  HENT: AT. [place]. Eyes:Pupils Equal Round Reactive to light, Extraocular movements intact,  Conjunctiva without redness, discharge or icterus. Neck/lymp/endocrine: Supple,no lymphadenopathy, no thyromegaly CV: RRR no murmru, no edema, +2/4 P posterior tibialis pulses Chest: CTAB, no wheeze or crackles Abd: Soft. NTND. BS present. no Masses palpated.  Skin: no rashes, purpura or petechiae.  Neuro:  Normal gait. PERLA. EOMi. Alert. Oriented.  Psych: Normal affect, dress and demeanor. Normal speech. Normal thought content and judgment.   No exam data present  No results found. Results for orders placed or performed in visit on 06/08/19 (from the past 24 hour(s))  POCT glycosylated hemoglobin (Hb A1C)     Status: Abnormal   Collection Time: 06/08/19  9:13 AM  Result Value Ref Range   Hemoglobin A1C 7.9 (A) 4.0 - 5.6 %   HbA1c POC (<> result, manual entry) 7.9 4.0 - 5.6 %   HbA1c, POC (prediabetic range) 7.9 (A) 5.7 - 6.4 %   HbA1c, POC (controlled diabetic range) 7.9 (A) 0.0 - 7.0 %    Assessment/Plan: Julie Osborne is a 69 y.o. female present for OV for  Hypertension, benign/HLD/coronary artery disease - stable at home.  -  Continue amlodipine/valsartan/HCTZ combo and metoprolol.  - low sodium, exercise.  - routine exercise encouraged.  - f/u 4 mos [place]  Morbid obesity (HCC)/Other diabetic neurological complication associated with type 2 diabetes mellitus ([place]) - a1c increased this time>> she had been ill twice this last 3 months.  Continue Metformin 1000 mg twice daily continue glipizide to 10/5 twice daily Continue Levemir 45-50 QHS.   Continue gabapentin  PNA series:  PSV23 02/2018, prevnar completed today Flu shot: UTD 2020 (recommneded yearly) BMP: 01/2019 WNL Foot exam: 11/2018 Eye exam: 08/09/2017  postponed d/t covid has scheduled A1c: 7.9--> 7.5 --> 6.2 --> 7.6--> 6.4--> 6.8 >> 7.6>>6.8>>7.9 Microallb: On ARB F/u 4 mos    Reviewed expectations re: course of current medical issues.  Discussed self-management of symptoms.  Outlined signs and symptoms indicating need for more acute intervention.  Patient verbalized understanding and all questions were answered.  Patient received an After-Visit Summary.    Orders Placed This Encounter  Procedures  . POCT glycosylated hemoglobin (Hb A1C)   Meds ordered this encounter  Medications  . metFORMIN (GLUCOPHAGE) 1000 MG tablet    Sig: Take 1 tablet (1,000 mg total) by mouth 2 (two) times daily with a meal.    Dispense:  180 tablet    Refill:  1  . Insulin Detemir  (LEVEMIR) 100 UNIT/ML Pen    Sig: Inject 40-50 Units into the skin at bedtime.    Dispense:  30 mL    Refill:  5  .  glipiZIDE (GLUCOTROL) 10 MG tablet    Sig: 1 tab (10mg )  in morning, 5 mg (1/2 tab)  at night    Dispense:  135 tablet    Refill:  1  . amLODIPine-Valsartan-HCTZ 10-320-25 MG TABS    Sig: Take 1 tablet by mouth daily.    Dispense:  90 tablet    Refill:  1  . metoprolol succinate (TOPROL-XL) 25 MG 24 hr tablet    Sig: Take 1 tablet (25 mg total) by mouth daily.    Dispense:  90 tablet    Refill:  1  . albuterol (VENTOLIN HFA) 108 (90 Base) MCG/ACT inhaler    Sig: Inhale 1-2 puffs into the lungs every 6 (six) hours as needed for wheezing or shortness of breath.    Dispense:  8 g    Refill:  1   Referral Orders  No referral(s) requested today      Note is dictated utilizing voice recognition software. Although note has been proof read prior to signing, occasional typographical errors still can be missed. If any questions arise, please do not hesitate to call for verification.   electronically signed by:  [name], DO  [place]

## 2019-06-08 NOTE — Patient Instructions (Addendum)
Printer is broke.  COVID-19 Vaccine Information can be found at: [email or web address].uk For questions related to vaccine distribution or appointments, please email [email or web address] or call [phone].  Covid Vaccine appointment go to [email or web address].  a1c went up to 7.9 today. Keep glipizide the same.  levemir 45-50 units before bed.    Follow up in 4 mos.

## 2019-07-14 ENCOUNTER — Ambulatory Visit: Payer: Medicare Other | Admitting: Family Medicine

## 2019-08-09 ENCOUNTER — Encounter: Payer: Self-pay | Admitting: Family Medicine

## 2019-08-10 ENCOUNTER — Other Ambulatory Visit: Payer: Self-pay

## 2019-08-10 MED ORDER — DULOXETINE HCL 20 MG PO CPEP: 20.0000 mg | ORAL_CAPSULE | Freq: Every day | ORAL | 3 refills | Status: DC

## 2019-09-16 ENCOUNTER — Encounter: Payer: Self-pay | Admitting: Family Medicine

## 2019-09-21 ENCOUNTER — Ambulatory Visit: Payer: Medicare Other | Admitting: Family Medicine

## 2019-10-01 ENCOUNTER — Ambulatory Visit: Payer: Medicare Other | Admitting: Family Medicine

## 2019-10-22 ENCOUNTER — Ambulatory Visit: Payer: Medicare Other | Admitting: Family Medicine

## 2019-10-27 ENCOUNTER — Telehealth: Payer: Self-pay

## 2019-10-27 ENCOUNTER — Other Ambulatory Visit: Payer: Self-pay

## 2019-10-27 NOTE — Telephone Encounter (Signed)
Requesting Voltaren Gel script to be sent to [facility] on [address]

## 2019-10-28 ENCOUNTER — Telehealth: Payer: Self-pay

## 2019-10-28 MED ORDER — DICLOFENAC SODIUM 1 % EX GEL: CUTANEOUS | 11 refills | Status: DC

## 2019-10-28 NOTE — Telephone Encounter (Signed)
Received refill request for Voltaren gel.  I have filled this prescription for her.  In the past it looks like it was prescribed by Ortho/sports medicine.  I am not certain if this is on her formulary.  If it is not covered by insurance, it is over-the-counter now.

## 2019-10-28 NOTE — Telephone Encounter (Signed)
Thanks, will notify [facility] of this information

## 2019-10-29 ENCOUNTER — Encounter: Payer: Self-pay | Admitting: Family Medicine

## 2019-10-29 DIAGNOSIS — I10 Essential (primary) hypertension: Secondary | ICD-10-CM

## 2019-10-29 MED ORDER — AMLODIPINE BESYLATE 10 MG PO TABS: 10.0000 mg | ORAL_TABLET | Freq: Every day | ORAL | 1 refills | Status: DC

## 2019-10-29 MED ORDER — VALSARTAN-HYDROCHLOROTHIAZIDE 320-25 MG PO TABS: 1.0000 | ORAL_TABLET | Freq: Every day | ORAL | 1 refills | Status: DC

## 2019-10-29 NOTE — Telephone Encounter (Signed)
Please inform patient I have discontinued the triple combination blood pressure pill secondary to patient's report of it has been unavailable and too expensive.  I have called in amlodipine 10 mg daily and a combo pill of her valsartan-HCTZ.

## 2019-11-02 ENCOUNTER — Other Ambulatory Visit: Payer: Self-pay | Admitting: Family Medicine

## 2019-11-10 ENCOUNTER — Ambulatory Visit: Payer: Medicare Other | Admitting: Family Medicine

## 2019-11-12 ENCOUNTER — Ambulatory Visit: Payer: Medicare Other | Admitting: Family Medicine

## 2019-11-26 ENCOUNTER — Other Ambulatory Visit: Payer: Self-pay | Admitting: Family Medicine

## 2019-11-26 DIAGNOSIS — E1149 Type 2 diabetes mellitus with other diabetic neurological complication: Secondary | ICD-10-CM

## 2019-12-03 ENCOUNTER — Other Ambulatory Visit: Payer: Self-pay

## 2019-12-11 ENCOUNTER — Ambulatory Visit: Payer: Medicare Other | Admitting: Family Medicine

## 2019-12-24 ENCOUNTER — Encounter: Payer: Self-pay | Admitting: Family Medicine

## 2019-12-24 ENCOUNTER — Other Ambulatory Visit: Payer: Self-pay

## 2019-12-24 ENCOUNTER — Ambulatory Visit (INDEPENDENT_AMBULATORY_CARE_PROVIDER_SITE_OTHER): Payer: Medicare Other | Admitting: Family Medicine

## 2019-12-24 VITALS — BP 152/86 | HR 67 | Temp 98.5°F | Resp 16 | Ht 71.0 in | Wt 276.0 lb

## 2019-12-24 DIAGNOSIS — E1140 Type 2 diabetes mellitus with diabetic neuropathy, unspecified: Secondary | ICD-10-CM

## 2019-12-24 DIAGNOSIS — Z23 Encounter for immunization: Secondary | ICD-10-CM | POA: Diagnosis not present

## 2019-12-24 DIAGNOSIS — E1149 Type 2 diabetes mellitus with other diabetic neurological complication: Secondary | ICD-10-CM

## 2019-12-24 DIAGNOSIS — E785 Hyperlipidemia, unspecified: Secondary | ICD-10-CM

## 2019-12-24 DIAGNOSIS — I10 Essential (primary) hypertension: Secondary | ICD-10-CM

## 2019-12-24 DIAGNOSIS — Z9861 Coronary angioplasty status: Secondary | ICD-10-CM

## 2019-12-24 DIAGNOSIS — Z1211 Encounter for screening for malignant neoplasm of colon: Secondary | ICD-10-CM

## 2019-12-24 DIAGNOSIS — I2510 Atherosclerotic heart disease of native coronary artery without angina pectoris: Secondary | ICD-10-CM

## 2019-12-24 DIAGNOSIS — Z794 Long term (current) use of insulin: Secondary | ICD-10-CM | POA: Diagnosis not present

## 2019-12-24 DIAGNOSIS — K635 Polyp of colon: Secondary | ICD-10-CM

## 2019-12-24 LAB — LIPID PANEL
Cholesterol: 140 mg/dL (ref 0–200)
HDL: 50.6 mg/dL (ref >39.00)
LDL Cholesterol: 61 mg/dL (ref 0–99)
NonHDL: 89.7
Total CHOL/HDL Ratio: 3
Triglycerides: 143 mg/dL (ref 0.0–149.0)
VLDL: 28.6 mg/dL (ref 0.0–40.0)

## 2019-12-24 LAB — POCT GLYCOSYLATED HEMOGLOBIN (HGB A1C)
HbA1c POC (<> result, manual entry): 8.2 % (ref 4.0–5.6)
HbA1c, POC (controlled diabetic range): 8.2 % — AB (ref 0.0–7.0)
HbA1c, POC (prediabetic range): 8.2 % — AB (ref 5.7–6.4)
Hemoglobin A1C: 8.2 % — AB (ref 4.0–5.6)

## 2019-12-24 LAB — CBC
HCT: 38.8 % (ref 36.0–46.0)
Hemoglobin: 13.2 g/dL (ref 12.0–15.0)
MCHC: 34 g/dL (ref 30.0–36.0)
MCV: 91.1 fl (ref 78.0–100.0)
Platelets: 261 10*3/uL (ref 150.0–400.0)
RBC: 4.26 Mil/uL (ref 3.87–5.11)
RDW: 13.8 % (ref 11.5–15.5)
WBC: 6.3 10*3/uL (ref 4.0–10.5)

## 2019-12-24 LAB — COMPREHENSIVE METABOLIC PANEL
ALT: 20 U/L (ref 0–35)
AST: 16 U/L (ref 0–37)
Albumin: 4.4 g/dL (ref 3.5–5.2)
Alkaline Phosphatase: 57 U/L (ref 39–117)
BUN: 10 mg/dL (ref 6–23)
CO2: 30 mEq/L (ref 19–32)
Calcium: 9.2 mg/dL (ref 8.4–10.5)
Chloride: 95 mEq/L — ABNORMAL LOW (ref 96–112)
Creatinine, Ser: 0.61 mg/dL (ref 0.40–1.20)
GFR: 97.81 mL/min (ref >60.00)
Glucose, Bld: 129 mg/dL — ABNORMAL HIGH (ref 70–99)
Potassium: 4.1 mEq/L (ref 3.5–5.1)
Sodium: 133 mEq/L — ABNORMAL LOW (ref 135–145)
Total Bilirubin: 0.6 mg/dL (ref 0.2–1.2)
Total Protein: 6.9 g/dL (ref 6.0–8.3)

## 2019-12-24 LAB — TSH: TSH: 1.4 u[IU]/mL (ref 0.35–4.50)

## 2019-12-24 MED ORDER — METOPROLOL SUCCINATE ER 50 MG PO TB24
50.0000 mg | ORAL_TABLET | Freq: Every day | ORAL | 1 refills | Status: DC
Start: 2019-12-24 — End: 2020-03-30

## 2019-12-24 MED ORDER — GLIPIZIDE 10 MG PO TABS
10.0000 mg | ORAL_TABLET | Freq: Two times a day (BID) | ORAL | 1 refills | Status: DC
Start: 2019-12-24 — End: 2020-03-30

## 2019-12-24 MED ORDER — VALSARTAN-HYDROCHLOROTHIAZIDE 320-25 MG PO TABS
1.0000 | ORAL_TABLET | Freq: Every day | ORAL | 1 refills | Status: DC
Start: 2019-12-24 — End: 2020-03-30

## 2019-12-24 MED ORDER — AMLODIPINE BESYLATE 10 MG PO TABS
10.0000 mg | ORAL_TABLET | Freq: Every day | ORAL | 1 refills | Status: DC
Start: 2019-12-24 — End: 2020-03-30

## 2019-12-24 MED ORDER — METFORMIN HCL 1000 MG PO TABS: 1000.0000 mg | ORAL_TABLET | Freq: Two times a day (BID) | ORAL | 1 refills | Status: DC

## 2019-12-24 MED ORDER — ALBUTEROL SULFATE HFA 108 (90 BASE) MCG/ACT IN AERS: 1.0000 | INHALATION_SPRAY | Freq: Four times a day (QID) | RESPIRATORY_TRACT | 11 refills | Status: DC | PRN

## 2019-12-24 MED ORDER — INSULIN DETEMIR 100 UNIT/ML FLEXPEN: 50.0000 [IU] | PEN_INJECTOR | Freq: Every day | SUBCUTANEOUS | 5 refills | Status: DC

## 2019-12-24 NOTE — Patient Instructions (Signed)
Increase metoprolol to 50 mg Qd and increased glipizide to 10 mg twice a day.  You can continue levemir 60 u before bed.  a1c 8.2 today.   Referred you back to your podiatrist and new Gi doctor for colonoscopy.     Follow up 3 mos

## 2019-12-24 NOTE — Progress Notes (Signed)
This visit occurred during the SARS-CoV-2 public health emergency.  Safety protocols were in place, including screening questions prior to the visit, additional usage of staff PPE, and extensive cleaning of exam room while observing appropriate contact time as indicated for disinfecting solutions.     Julie Osborne , [birth year], 69 y.o., female MRN: [id number] Patient Care Team    Relationship Specialty Notifications Start End  Ma Hillock, DO PCP - General Family Medicine  01/13/16   Leda Roys, DPM Referring Physician Podiatry  12/24/19     Chief Complaint  Patient presents with  . Follow-up    [facility]    Subjective: Pt presents for an OV for routine chronic condition follow up.  Diabetes/morbid obesity:  Pt reportscompliancewith metformin 1000 mg BID, glipizide 10/5 mg BID,  and Levemir  QHS-she reports she has been seeing higher sugars so she increased her Levemir from 50 to 60 units nightly. Patient denies dizziness, hyperglycemic or hypoglycemic events. Patient denies numbness, tingling in the extremities or nonhealing wounds of feet.  Patient reports her fasting sugars have been 155-175 now She does have neuropathy of toes and hands on occasions. She has tolerating gabapentin 300/400. Onset: 2005 PNA series: completed Flu shot: UTD 2021 today (recommneded yearly) Foot exam: 11/2019> discussed referral back to orthopedics and she is having discomfort secondary to pressure on her second toes from mild mallet deformity and recent large development of blister on her left lateral foot.  She is agreeable to this.  She may be candidate for diabetic shoes. Eye exam:2021- Ophth on [place]. Dr. Dennie Fetters A1c: 7.9--> 7.5 --> 6.2 --> 7.6--> 6.4--> 6.8 >> 7.6>>6.8>>7.9>8.2 Microallb: On ARB  HTN/hyperlipidemia: Pt reports compliance  with amlodipine 10, valsartan-hctz 10-320-25, metoprolol 25 mg daily, Crestor and ASA 81 today.Patient denies chest pain, shortness of breath,  dizziness or lower extremity edema.   Depression screen [facility] 2/9 02/26/2018 08/16/2017 08/13/2016 01/13/2016  Decreased Interest 0 0 0 0  Down, Depressed, Hopeless 0 0 0 0  PHQ - 2 Score 0 0 0 0    Allergies  Allergen Reactions  . Statins     Lipitor,simvatatin, pravastatin.welchol cause myalgia   Social History   Social History Narrative   Married to [employer], 2 children.    BS, RN retired now Printmaker.    Drinks caffeine, uses herbal remedies. Daily vitmain use.   Wears her seatbelt, smoke detector in the home.    exercises routinely.    She feels safe in her relationships.    Past Medical History:  Diagnosis Date  . Apnea   . Degenerative arthritis   . Diabetic neuropathy ([place]) 2017  . Diverticulosis 06/19/2013   sigmoid colon on colonoscopy  . Hyperlipidemia 2000  . Hypertension, benign 1996  . Lumbar radiculopathy    Severe L4/L5 radiculopathy by old records.   . Tenosynovitis of ankle 2017   post tibial tendon dysfunction stage 5 (PTTD)  . Uncontrolled diabetes mellitus ([place]) 2005   Past Surgical History:  Procedure Laterality Date  . CORONARY ANGIOPLASTY WITH STENT PLACEMENT  10/17/2018   DES x2/LAD  . EXPLORATORY LAPAROTOMY  1976   endometriosis  . MINOR HEMORRHOIDECTOMY  2006  . OTHER SURGICAL HISTORY Right 1975   venous ligation   Family History  Problem Relation Age of Onset  . COPD Mother   . Diabetes Mellitus II Mother   . Heart failure Mother   . Osteoarthritis Father   . Lung cancer Father 69  . Diabetes Sister   .  Diabetes Brother   . Dementia Maternal Grandmother   . Heart disease Maternal Grandmother   . Heart disease Maternal Grandfather   . Stroke Maternal Grandfather   . Heart failure Maternal Aunt   . Lung cancer Paternal Grandfather   . Lung cancer Paternal Aunt    Allergies as of 12/24/2019      Reactions   Statins    Lipitor,simvatatin, pravastatin.welchol cause myalgia      Medication List       Accurate as of December 24, 2019   5:09 PM. If you have any questions, ask your nurse or doctor.        STOP taking these medications   clopidogrel 75 MG tablet Commonly known as: PLAVIX Stopped by: [name], DO   clotrimazole 1 % external solution Commonly known as: LOTRIMIN Stopped by: [name], DO   docusate sodium 100 MG capsule Commonly known as: COLACE Stopped by: [name], DO   traMADol 50 MG tablet Commonly known as: ULTRAM Stopped by: [name], DO     TAKE these medications   albuterol 108 (90 Base) MCG/ACT inhaler Commonly known as: VENTOLIN HFA Inhale 1-2 puffs into the lungs every 6 (six) hours as needed for wheezing or shortness of breath.   amLODipine 10 MG tablet Commonly known as: NORVASC Take 1 tablet (10 mg total) by mouth daily.   aspirin 81 MG chewable tablet Chew by mouth daily.   calcium citrate-vitamin D 315-200 MG-UNIT tablet Commonly known as: CITRACAL+D Take 1 tablet by mouth daily.   CoQ10 100 MG Caps Take 1 tablet by mouth daily.   diclofenac Sodium 1 % Gel Commonly known as: Voltaren Apply topically 4 times daily PRN What changed: Another medication with the same name was removed. Continue taking this medication, and follow the directions you see here. Changed by: [name], DO   DULoxetine 20 MG capsule Commonly known as: CYMBALTA TAKE 1 CAPSULE BY MOUTH EVERY DAY   FIBER-CAPS PO Take by mouth.   fluticasone 50 MCG/ACT nasal spray Commonly known as: FLONASE Place 2 sprays into both nostrils daily.   glipiZIDE 10 MG tablet Commonly known as: GLUCOTROL Take 1 tablet (10 mg total) by mouth 2 (two) times daily before a meal. What changed:   how much to take  how to take this  when to take this  additional instructions Changed by: [name], DO   glucose blood test strip Commonly known as: OneTouch Verio Test blood sugar before meals and at bedtime   insulin detemir 100 UNIT/ML FlexPen Commonly known as: LEVEMIR Inject 50-60  Units into the skin at bedtime. What changed: how much to take Changed by: [name], DO   Insulin Pen Needle 31G X 5 MM Misc Use as directed   Magnesium 400 MG Caps Take 1 tablet by mouth daily.   metFORMIN 1000 MG tablet Commonly known as: GLUCOPHAGE Take 1 tablet (1,000 mg total) by mouth 2 (two) times daily with a meal.   metoprolol succinate 50 MG 24 hr tablet Commonly known as: TOPROL-XL Take 1 tablet (50 mg total) by mouth daily. What changed:   medication strength  how much to take Changed by: [name], DO   multivitamin capsule Take 1 capsule by mouth daily.   nitroGLYCERIN 0.4 MG SL tablet Commonly known as: NITROSTAT Place under the tongue.   OneTouch Delica Lancets 78E Misc 1 applicator by Does not apply route 4 (four) times daily -  before meals and at bedtime.  QC Tumeric Complex 500 MG Caps Generic drug: Turmeric Take by mouth.   rosuvastatin 40 MG tablet Commonly known as: CRESTOR Take 40 mg by mouth daily.   TART CHERRY ADVANCED PO Take by mouth.   valsartan-hydrochlorothiazide 320-25 MG tablet Commonly known as: DIOVAN-HCT Take 1 tablet by mouth daily.   VITAMIN B12-FOLIC ACID PO Take 1 tablet by mouth daily.   vitamin C 100 MG tablet Take 1 tablet by mouth daily.   Vitamin D3 75 MCG (3000 UT) Tabs Take by mouth. 2,000 units daily   Vitamin K2 100 MCG Caps Take 1 tablet by mouth daily.   Zinc 25 MG Tabs Take 1 tablet by mouth daily.       All past medical history, surgical history, allergies, family history, immunizations andmedications were updated in the EMR today and reviewed under the history and medication portions of their EMR.     ROS: Negative, with the exception of above mentioned in HPI   Objective:  BP (!) 152/86   Pulse 67   Temp 98.5 F (36.9 C) (Oral)   Resp 16   Ht '5\' 11"'  (1.803 m)   Wt 276 lb (125.2 kg)   SpO2 99%   BMI 38.49 kg/m  Body mass index is 38.49 kg/m. Gen: Afebrile. No acute  distress.  Nontoxic, pleasant, Caucasian female.  Obese. HENT: AT. [place].  Eyes:Pupils Equal Round Reactive to light, Extraocular movements intact,  Conjunctiva without redness, discharge or icterus. CV: RRR no murmur, no edema, +2/4 P posterior tibialis pulses Chest: CTAB, no wheeze or crackles Neuro: Normal gait. PERLA. EOMi. Alert. Oriented x3  Psych: Normal affect, dress and demeanor. Normal speech. Normal thought content and judgment.  Diabetic Foot Exam - Simple   Simple Foot Form Diabetic Foot exam was performed with the following findings: Yes 12/24/2019  5:01 PM  Visual Inspection See comments: Yes Sensation Testing Intact to touch and monofilament testing bilaterally: Yes Pulse Check Posterior Tibialis and Dorsalis pulse intact bilaterally: Yes Comments Well-healing large blister left lateral foot.  No signs of erythema or infection.  Rubbing/pressure noted on bilateral second toe secondary to mallet deformity (mild)      No exam data present No results found. Results for orders placed or performed in visit on 12/24/19 (from the past 24 hour(s))  POCT HgB A1C     Status: Abnormal   Collection Time: 12/24/19  8:59 AM  Result Value Ref Range   Hemoglobin A1C 8.2 (A) 4.0 - 5.6 %   HbA1c POC (<> result, manual entry) 8.2 4.0 - 5.6 %   HbA1c, POC (prediabetic range) 8.2 (A) 5.7 - 6.4 %   HbA1c, POC (controlled diabetic range) 8.2 (A) 0.0 - 7.0 %  Comp Met (CMET)     Status: Abnormal   Collection Time: 12/24/19  9:15 AM  Result Value Ref Range   Sodium 133 (L) 135 - 145 mEq/L   Potassium 4.1 3.5 - 5.1 mEq/L   Chloride 95 (L) 96 - 112 mEq/L   CO2 30 19 - 32 mEq/L   Glucose, Bld 129 (H) 70 - 99 mg/dL   BUN 10 6 - 23 mg/dL   Creatinine, Ser 0.61 0.40 - 1.20 mg/dL   Total Bilirubin 0.6 0.2 - 1.2 mg/dL   Alkaline Phosphatase 57 39 - 117 U/L   AST 16 0 - 37 U/L   ALT 20 0 - 35 U/L   Total Protein 6.9 6.0 - 8.3 g/dL   Albumin 4.4 3.5 - 5.2  g/dL   GFR 97.81 >60.00 mL/min    Calcium 9.2 8.4 - 10.5 mg/dL  CBC     Status: None   Collection Time: 12/24/19  9:15 AM  Result Value Ref Range   WBC 6.3 4.0 - 10.5 K/uL   RBC 4.26 3.87 - 5.11 Mil/uL   Platelets 261.0 150 - 400 K/uL   Hemoglobin 13.2 12.0 - 15.0 g/dL   HCT 38.8 36 - 46 %   MCV 91.1 78.0 - 100.0 fl   MCHC 34.0 30.0 - 36.0 g/dL   RDW 13.8 11.5 - 15.5 %  TSH     Status: None   Collection Time: 12/24/19  9:15 AM  Result Value Ref Range   TSH 1.40 0.35 - 4.50 uIU/mL  Lipid panel     Status: None   Collection Time: 12/24/19  9:15 AM  Result Value Ref Range   Cholesterol 140 0 - 200 mg/dL   Triglycerides 143.0 0 - 149 mg/dL   HDL 50.60 >39.00 mg/dL   VLDL 28.6 0.0 - 40.0 mg/dL   LDL Cholesterol 61 0 - 99 mg/dL   Total CHOL/HDL Ratio 3    NonHDL 89.70     Assessment/Plan: Keliyah Spillman is a 69 y.o. female present for OV for  Hypertension, benign/HLD/coronary artery disease -Above goal. -Continue amlodipine 10 mg daily.   -Continue valsartan/HCTZ 320-12.5.   -Increase metoprolol to 50 mg daily for better coverage.  Reviewed last blood pressures in the system including other offices and she has been mildly elevated for some time. - low sodium, exercise.  - routine exercise encouraged.  - f/u 3 mos [place]  Morbid obesity (HCC)/Other diabetic neurological complication associated with type 2 diabetes mellitus ([place]) -Onset has continued to increase for her. Continue Metformin 1000 mg twice daily Increase glipizide to 10/5> to 10 mg twice daily Continue Levemir at the increased dose of 60 units nightly Continue gabapentin (see below for dosage) PNA series: completed Flu shot: UTD 2021 today (recommneded yearly) Foot exam: 11/2019> discussed referral back to orthopedics and she is having discomfort secondary to pressure on her second toes from mild mallet deformity and recent large development of blister on her left lateral foot.  She is agreeable to this.  She may be candidate for diabetic shoes. Eye  exam:2021- Ophth on [place]. Dr. Dennie Fetters A1c: 7.9--> 7.5 --> 6.2 --> 7.6--> 6.4--> 6.8 >> 7.6>>6.8>>7.9>8.2 Microallb: On ARB  Need for influenza vaccine: Influenza administered today.  Need for colon cancer screening/colon polyps: Last screening completed little over 5 years ago.  Patient did have a 5-year recall per records.  Records are scanned into the system that were completed in [place].  Referral to gastroenterology placed for her today.   Reviewed expectations re: course of current medical issues.  Discussed self-management of symptoms.  Outlined signs and symptoms indicating need for more acute intervention.  Patient verbalized understanding and all questions were answered.  Patient received an After-Visit Summary.    Orders Placed This Encounter  Procedures  . Flu Vaccine QUAD High Dose(Fluad)  . Comp Met (CMET)  . CBC  . TSH  . Lipid panel  . Ambulatory referral to Podiatry  . Ambulatory referral to Gastroenterology  . POCT HgB A1C   Meds ordered this encounter  Medications  . metFORMIN (GLUCOPHAGE) 1000 MG tablet    Sig: Take 1 tablet (1,000 mg total) by mouth 2 (two) times daily with a meal.    Dispense:  180 tablet    Refill:  1  . glipiZIDE (GLUCOTROL) 10 MG tablet    Sig: Take 1 tablet (10 mg total) by mouth 2 (two) times daily before a meal.    Dispense:  180 tablet    Refill:  1  . amLODipine (NORVASC) 10 MG tablet    Sig: Take 1 tablet (10 mg total) by mouth daily.    Dispense:  90 tablet    Refill:  1    Please discontinue amlodipine/valsartan/HCTZ combo pill  . valsartan-hydrochlorothiazide (DIOVAN-HCT) 320-25 MG tablet    Sig: Take 1 tablet by mouth daily.    Dispense:  90 tablet    Refill:  1  . metoprolol succinate (TOPROL-XL) 50 MG 24 hr tablet    Sig: Take 1 tablet (50 mg total) by mouth daily.    Dispense:  90 tablet    Refill:  1  . insulin detemir (LEVEMIR) 100 UNIT/ML FlexPen    Sig: Inject 50-60 Units into the skin at bedtime.     Dispense:  30 mL    Refill:  5    Referral Orders     Ambulatory referral to Podiatry     Ambulatory referral to Gastroenterology    Note is dictated utilizing voice recognition software. Although note has been proof read prior to signing, occasional typographical errors still can be missed. If any questions arise, please do not hesitate to call for verification.   electronically signed by:  [name], DO  [place]

## 2019-12-29 LAB — HM DIABETES EYE EXAM

## 2020-01-05 ENCOUNTER — Encounter: Payer: Self-pay | Admitting: Family Medicine

## 2020-02-17 ENCOUNTER — Ambulatory Visit: Payer: Medicare Other | Admitting: Family Medicine

## 2020-02-23 NOTE — Progress Notes (Signed)
Subjective:   Julie Osborne is a 69 y.o. female who presents for an Initial Medicare Annual Wellness Visit.  I connected with Julie Osborne today by telephone and verified that I am speaking with the correct person using two identifiers. 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    I discussed the limitations, risks, security and privacy concerns of performing an evaluation and management service by telephone and the availability of in person appointments. I also discussed with the patient that there may be a patient responsible charge related to this service. The patient expressed understanding and verbally consented to this telephonic visit.    Interactive audio and video telecommunications were attempted between this provider and patient, however failed, due to patient having technical difficulties OR patient did not have access to video capability.  We continued and completed visit with audio only.  Some vital signs may be absent or patient reported.   Time Spent with patient on telephone encounter: 25 minutes  Review of Systems     Cardiac Risk Factors include: advanced age (>34mn, >>65women);obesity (BMI >30kg/m2);hypertension;dyslipidemia;diabetes mellitus     Objective:    Today's Vitals   02/24/20 0906  Weight: 271 lb (122.9 kg)  Height: '5\' 11"'  (1.803 m)   Body mass index is 37.8 kg/m.  Advanced Directives 02/24/2020 06/07/2016  Does Patient Have a Medical Advance Directive? No No  Would patient like information on creating a medical advance directive? Yes (MAU/Ambulatory/Procedural Areas - Information given) Yes (ED - Information included in AVS)    Current Medications (verified) Outpatient Encounter Medications as of 02/24/2020  Medication Sig  . albuterol (VENTOLIN HFA) 108 (90 Base) MCG/ACT inhaler Inhale 1-2 puffs into the lungs every 6 (six) hours as needed for wheezing or shortness of breath.  .Marland Kitchen amLODipine (NORVASC) 10 MG tablet Take 1 tablet (10 mg total) by mouth daily.  . Ascorbic Acid (VITAMIN C) 100 MG tablet Take 1 tablet by mouth daily.   .Marland Kitchenaspirin 81 MG chewable tablet Chew by mouth daily.   . calcium citrate-vitamin D (CITRACAL+D) 315-200 MG-UNIT tablet Take 1 tablet by mouth daily.   . Calcium Polycarbophil (FIBER-CAPS PO) Take by mouth.   . Cholecalciferol (VITAMIN D3) 75 MCG (3000 UT) TABS Take by mouth. 2,000 units daily  . Cobalamin Combinations (VITAMIN B12-FOLIC ACID PO) Take 1 tablet by mouth daily.   . Coenzyme Q10 (COQ10) 100 MG CAPS Take 1 tablet by mouth daily.   . diclofenac Sodium (VOLTAREN) 1 % GEL Apply topically 4 times daily PRN  . DULoxetine (CYMBALTA) 20 MG capsule TAKE 1 CAPSULE BY MOUTH EVERY DAY  . fluticasone (FLONASE) 50 MCG/ACT nasal spray Place 2 sprays into both nostrils daily.  .Marland KitchenglipiZIDE (GLUCOTROL) 10 MG tablet Take 1 tablet (10 mg total) by mouth 2 (two) times daily before a meal.  . glucose blood (ONETOUCH VERIO) test strip Test blood sugar before meals and at bedtime  . insulin detemir (LEVEMIR) 100 UNIT/ML FlexPen Inject 50-60 Units into the skin at bedtime.  . Insulin Pen Needle 31G X 5 MM MISC Use as directed  . Magnesium 400 MG CAPS Take 1 tablet by mouth daily.   . Menaquinone-7 (VITAMIN K2) 100 MCG CAPS Take 1 tablet by mouth daily.   . metFORMIN (GLUCOPHAGE) 1000 MG tablet Take 1 tablet (1,000 mg total) by mouth 2 (two) times daily with a meal.  . metoprolol succinate (TOPROL-XL) 50 MG 24 hr tablet Take 1 tablet (  50 mg total) by mouth daily.  . Misc Natural Products (TART CHERRY ADVANCED PO) Take by mouth.   . Multiple Vitamin (MULTIVITAMIN) capsule Take 1 capsule by mouth daily.   . nitroGLYCERIN (NITROSTAT) 0.4 MG SL tablet Place under the tongue.   Glory Rosebush DELICA LANCETS 62B MISC 1 applicator by Does not apply route 4 (four) times daily -  before meals and at bedtime.  . rosuvastatin (CRESTOR) 40 MG tablet Take 40 mg by mouth  daily.   . Turmeric (QC TUMERIC COMPLEX) 500 MG CAPS Take by mouth.   . valsartan-hydrochlorothiazide (DIOVAN-HCT) 320-25 MG tablet Take 1 tablet by mouth daily.  . Zinc 25 MG TABS Take 1 tablet by mouth daily.    No facility-administered encounter medications on file as of 02/24/2020.    Allergies (verified) Statins   History: Past Medical History:  Diagnosis Date  . Apnea   . Closed fracture of neck of left radius 10/14/2017  . Degenerative arthritis   . Diabetic neuropathy ([place]) 2017  . Diverticulosis 06/19/2013   sigmoid colon on colonoscopy  . Hyperlipidemia 2000  . Hypertension, benign 1996  . Lumbar radiculopathy    Severe L4/L5 radiculopathy by old records.   . Pes planus 12/17/2011  . Tenosynovitis of ankle 2017   post tibial tendon dysfunction stage 5 (PTTD)  . Uncontrolled diabetes mellitus ([place]) 2005   Past Surgical History:  Procedure Laterality Date  . CORONARY ANGIOPLASTY WITH STENT PLACEMENT  10/17/2018   DES x2/LAD  . EXPLORATORY LAPAROTOMY  1976   endometriosis  . MINOR HEMORRHOIDECTOMY  2006  . OTHER SURGICAL HISTORY Right 1975   venous ligation   Family History  Problem Relation Age of Onset  . COPD Mother   . Diabetes Mellitus II Mother   . Heart failure Mother   . Osteoarthritis Father   . Lung cancer Father 33  . Diabetes Sister   . Diabetes Brother   . Dementia Maternal Grandmother   . Heart disease Maternal Grandmother   . Heart disease Maternal Grandfather   . Stroke Maternal Grandfather   . Heart failure Maternal Aunt   . Lung cancer Paternal Grandfather   . Lung cancer Paternal Aunt    Social History   Socioeconomic History  . Marital status: Married    Spouse name: Julie Osborne  . Number of children: 2  . Years of education: 44  . Highest education level: Not on file  Occupational History  . Occupation: Pharmacist, hospital  . Occupation: retired Marine scientist  Tobacco Use  . Smoking status: Former Smoker    Types: Cigarettes  . Smokeless tobacco:  Never Used  Vaping Use  . Vaping Use: Never used  Substance and Sexual Activity  . Alcohol use: No  . Drug use: No  . Sexual activity: Yes    Partners: Male    Birth control/protection: None    Comment: Married  Other Topics Concern  . Not on file  Social History Narrative   Married to [employer], 2 children.    BS, RN retired now Printmaker.    Drinks caffeine, uses herbal remedies. Daily vitmain use.   Wears her seatbelt, smoke detector in the home.    exercises routinely.    She feels safe in her relationships.    Social Determinants of Health   Financial Resource Strain: Low Risk   . Difficulty of Paying Living Expenses: Not hard at all  Food Insecurity: No Food Insecurity  . Worried About Charity fundraiser in  the Last Year: Never true  . Ran Out of Food in the Last Year: Never true  Transportation Needs: No Transportation Needs  . Lack of Transportation (Medical): No  . Lack of Transportation (Non-Medical): No  Physical Activity: Sufficiently Active  . Days of Exercise per Week: 7 days  . Minutes of Exercise per Session: 30 min  Stress: No Stress Concern Present  . Feeling of Stress : Only a little  Social Connections: Moderately Integrated  . Frequency of Communication with Friends and Family: More than three times a week  . Frequency of Social Gatherings with Friends and Family: Once a week  . Attends Religious Services: More than 4 times per year  . Active Member of Clubs or Organizations: No  . Attends Archivist Meetings: Never  . Marital Status: Married    Tobacco Counseling Counseling given: Not Answered   Clinical Intake:  Pre-visit preparation completed: Yes  Pain : No/denies pain     Nutritional Status: BMI > 30  Obese Nutritional Risks: None Diabetes: Yes CBG done?: No Did pt. bring in CBG monitor from home?: No (phone visit)  How often do you need to have someone help you when you read instructions, pamphlets, or other written  materials from your doctor or pharmacy?: 1 - Never What is the last grade level you completed in school?: College  Diabetes:  Is the patient diabetic?  Yes  If diabetic, was a CBG obtained today?  No  Did the patient bring in their glucometer from home?  No phone visit How often do you monitor your CBG's? Daily.   Financial Strains and Diabetes Management:  Are you having any financial strains with the device, your supplies or your medication? No .  Does the patient want to be seen by Chronic Care Management for management of their diabetes?  No  Would the patient like to be referred to a Nutritionist or for Diabetic Management?  No   Diabetic Exams:  Diabetic Eye Exam: Completed 12/29/2019.   Diabetic Foot Exam: Completed 12/24/2019.    Interpreter Needed?: No  Information entered by :: Lesly Dukes LPN   Activities of Daily Living In your present state of health, do you have any difficulty performing the following activities: 02/24/2020  Hearing? N  Vision? N  Difficulty concentrating or making decisions? N  Walking or climbing stairs? N  Dressing or bathing? N  Doing errands, shopping? N  Preparing Food and eating ? N  Using the Toilet? N  In the past six months, have you accidently leaked urine? Y  Comment occasionally  Do you have problems with loss of bowel control? N  Managing your Medications? N  Managing your Finances? N  Housekeeping or managing your Housekeeping? N  Some recent data might be hidden    Patient Care Team: Ma Hillock, DO as PCP - General (Family Medicine) Kathie Dike Gatha Mayer, DPM as Referring Physician (Podiatry)  Indicate any recent Medical Services you may have received from other than [facility] providers in the past year (date may be approximate).     Assessment:   This is a routine wellness examination for Julie Osborne.  Hearing/Vision screen  Hearing Screening   '125Hz'  '250Hz'  '500Hz'  '1000Hz'  '2000Hz'  '3000Hz'  '4000Hz'  '6000Hz'  '8000Hz'   Right  ear:           Left ear:           Comments: No issues  Vision Screening Comments: Wears glasses Dr. Renaldo Harrison  Dietary issues and exercise  activities discussed: Current Exercise Habits: Home exercise routine, Type of exercise: strength training/weights, Time (Minutes): 30, Frequency (Times/Week): 7, Weekly Exercise (Minutes/Week): 210, Exercise limited by: None identified  Goals    . Patient Stated     Eat healthier , increase activity & continue to drink plenty of water      Depression Screen PHQ 2/9 Scores 02/24/2020 02/26/2018 08/16/2017 08/13/2016 01/13/2016  PHQ - 2 Score 0 0 0 0 0    Fall Risk Fall Risk  02/24/2020 02/26/2018 08/16/2017 08/13/2016 01/13/2016  Falls in the past year? 1 1 No No No  Number falls in past yr: 0 0 - - -  Injury with Fall? 0 1 - - -  Risk for fall due to : - Other (Comment) - - -  Risk for fall due to: Comment - trip injury - - -  Follow up Falls prevention discussed Falls evaluation completed;Falls prevention discussed - - -    Any stairs in or around the home? No  Home free of loose throw rugs in walkways, pet beds, electrical cords, etc? No  Adequate lighting in your home to reduce risk of falls? No   ASSISTIVE DEVICES UTILIZED TO PREVENT FALLS:  Life alert? No  Use of a cane, walker or w/c? No  Grab bars in the bathroom? Yes  Shower chair or bench in shower? No  Elevated toilet seat or a handicapped toilet? No   TIMED UP AND GO:  Was the test performed? No . Phone visit   Cognitive Function:No cognitive impairment noted        Immunizations Immunization History  Administered Date(s) Administered  . Fluad Quad(high Dose 65+) 12/04/2018, 12/24/2019  . Influenza,inj,Quad PF,6+ Mos 01/13/2016, 12/25/2016  . MMR 02/11/2020  . PFIZER SARS-COV-2 Vaccination 01/30/2020, 02/20/2020  . Pneumococcal Conjugate-13 01/07/2006, 06/08/2019  . Pneumococcal Polysaccharide-23 02/26/2018  . Td 01/21/2011  . Tdap 02/11/2020  . Zoster 04/09/2012    . Zoster Recombinat (Shingrix) 08/13/2016, 12/25/2016    TDAP status: Up to date   Flu Vaccine status: Up to date   Pneumococcal vaccine status: Up to date   Covid-19 vaccine status: Completed vaccines  Qualifies for Shingles Vaccine? No   Zostavax completed Yes   Shingrix Completed?: Yes  Screening Tests Health Maintenance  Topic Date Due  . COLONOSCOPY  06/20/2018  . MAMMOGRAM  04/28/2020  . HEMOGLOBIN A1C  06/22/2020  . FOOT EXAM  12/23/2020  . OPHTHALMOLOGY EXAM  12/28/2020  . DEXA SCAN  04/28/2028  . TETANUS/TDAP  02/10/2030  . INFLUENZA VACCINE  Completed  . COVID-19 Vaccine  Completed  . PNA vac Low Risk Adult  Completed    Health Maintenance  Health Maintenance Due  Topic Date Due  . COLONOSCOPY  06/20/2018    Colorectal cancer screening: Patient states she has an appt scheduled for sometime in December.  Mammogram status: Due- Patient would like to wait and schedule with Bone Density in January.  Bone Density status: Completed 04/28/2018. Results reflect: Bone density results: NORMAL. Repeat every 2 years.  Lung Cancer Screening: (Low Dose CT Chest recommended if Age 31-80 years, 30 pack-year currently smoking OR have quit w/in 15years.) does not qualify.     Additional Screening:  Hepatitis C Screening: does qualify; Ordered today. To be collected at next office visit.  Vision Screening: Recommended annual ophthalmology exams for early detection of glaucoma and other disorders of the eye. Is the patient up to date with their annual eye exam?  Yes  Who  is the provider or what is the name of the office in which the patient attends annual eye exams? Dr. Varney Baas   Dental Screening: Recommended annual dental exams for proper oral hygiene  Community Resource Referral / Chronic Care Management: CRR required this visit?  No   CCM required this visit?  No      Plan:     I have personally reviewed and noted the following in the patient's chart:    . Medical and social history . Use of alcohol, tobacco or illicit drugs  . Current medications and supplements . Functional ability and status . Nutritional status . Physical activity . Advanced directives . List of other physicians . Hospitalizations, surgeries, and ER visits in previous 12 months . Vitals . Screenings to include cognitive, depression, and falls . Referrals and appointments  In addition, I have reviewed and discussed with patient certain preventive protocols, quality metrics, and best practice recommendations. A written personalized care plan for preventive services as well as general preventive health recommendations were provided to patient.   Due to this being a telephonic visit, the after visit summary with patients personalized plan was offered to patient via mail or my-chart. Patient would like to access on my-chart.   Marta Antu, LPN   [id number]  Nurse Health Advisor  Nurse Notes: None

## 2020-02-24 ENCOUNTER — Ambulatory Visit (INDEPENDENT_AMBULATORY_CARE_PROVIDER_SITE_OTHER): Payer: Medicare Other

## 2020-02-24 VITALS — Ht 71.0 in | Wt 271.0 lb

## 2020-02-24 DIAGNOSIS — Z0000 Encounter for general adult medical examination without abnormal findings: Secondary | ICD-10-CM | POA: Diagnosis not present

## 2020-02-24 DIAGNOSIS — Z1159 Encounter for screening for other viral diseases: Secondary | ICD-10-CM | POA: Diagnosis not present

## 2020-02-24 NOTE — Patient Instructions (Signed)
Julie Osborne , Thank you for taking time to complete your Medicare Wellness Visit. I appreciate your ongoing commitment to your health goals. Please review the following plan we discussed and let me know if I can assist you in the future.   Screening recommendations/referrals: Colonoscopy: Scheduled for 03/2020. Mammogram: Due- Per our conversation, you would like to wait & schedule with Bone Density in January. Bone Density: Completed 04/28/2018- Due- 04/28/2020 Recommended yearly ophthalmology/optometry visit for glaucoma screening and checkup Recommended yearly dental visit for hygiene and checkup  Vaccinations: Influenza vaccine: Up to date Pneumococcal vaccine: Completed vaccines Tdap vaccine: Up to date- Due-02/10/2030 Shingles vaccine: Completed vaccines   Covid-19:Completed vaccines  Advanced directives: Information mailed today  Conditions/risks identified: See problem list  Next appointment: Follow up in one year for your annual wellness visit    Preventive Care 65 Years and Older, Female Preventive care refers to lifestyle choices and visits with your health care provider that can promote health and wellness. What does preventive care include?  A yearly physical exam. This is also called an annual well check.  Dental exams once or twice a year.  Routine eye exams. Ask your health care provider how often you should have your eyes checked.  Personal lifestyle choices, including:  Daily care of your teeth and gums.  Regular physical activity.  Eating a healthy diet.  Avoiding tobacco and drug use.  Limiting alcohol use.  Practicing safe sex.  Taking low-dose aspirin every day.  Taking vitamin and mineral supplements as recommended by your health care provider. What happens during an annual well check? The services and screenings done by your health care provider during your annual well check will depend on your age, overall health, lifestyle risk factors, and  family history of disease. Counseling  Your health care provider may ask you questions about your:  Alcohol use.  Tobacco use.  Drug use.  Emotional well-being.  Home and relationship well-being.  Sexual activity.  Eating habits.  History of falls.  Memory and ability to understand (cognition).  Work and work Statistician.  Reproductive health. Screening  You may have the following tests or measurements:  Height, weight, and BMI.  Blood pressure.  Lipid and cholesterol levels. These may be checked every 5 years, or more frequently if you are over 90 years old.  Skin check.  Lung cancer screening. You may have this screening every year starting at age 36 if you have a 30-pack-year history of smoking and currently smoke or have quit within the past 15 years.  Fecal occult blood test (FOBT) of the stool. You may have this test every year starting at age 54.  Flexible sigmoidoscopy or colonoscopy. You may have a sigmoidoscopy every 5 years or a colonoscopy every 10 years starting at age 82.  Hepatitis C blood test.  Hepatitis B blood test.  Sexually transmitted disease (STD) testing.  Diabetes screening. This is done by checking your blood sugar (glucose) after you have not eaten for a while (fasting). You may have this done every 1-3 years.  Bone density scan. This is done to screen for osteoporosis. You may have this done starting at age 45.  Mammogram. This may be done every 1-2 years. Talk to your health care provider about how often you should have regular mammograms. Talk with your health care provider about your test results, treatment options, and if necessary, the need for more tests. Vaccines  Your health care provider may recommend certain vaccines, such as:  Influenza vaccine. This is recommended every year.  Tetanus, diphtheria, and acellular pertussis (Tdap, Td) vaccine. You may need a Td booster every 10 years.  Zoster vaccine. You may need this  after age 38.  Pneumococcal 13-valent conjugate (PCV13) vaccine. One dose is recommended after age 56.  Pneumococcal polysaccharide (PPSV23) vaccine. One dose is recommended after age 88. Talk to your health care provider about which screenings and vaccines you need and how often you need them. This information is not intended to replace advice given to you by your health care provider. Make sure you discuss any questions you have with your health care provider. Document Released: 04/22/2015 Document Revised: 12/14/2015 Document Reviewed: 01/25/2015 Elsevier Interactive Patient Education  2017 [place] Prevention in the Home Falls can cause injuries. They can happen to people of all ages. There are many things you can do to make your home safe and to help prevent falls. What can I do on the outside of my home?  Regularly fix the edges of walkways and driveways and fix any cracks.  Remove anything that might make you trip as you walk through a door, such as a raised step or threshold.  Trim any bushes or trees on the path to your home.  Use bright outdoor lighting.  Clear any walking paths of anything that might make someone trip, such as rocks or tools.  Regularly check to see if handrails are loose or broken. Make sure that both sides of any steps have handrails.  Any raised decks and porches should have guardrails on the edges.  Have any leaves, snow, or ice cleared regularly.  Use sand or salt on walking paths during winter.  Clean up any spills in your garage right away. This includes oil or grease spills. What can I do in the bathroom?  Use night lights.  Install grab bars by the toilet and in the tub and shower. Do not use towel bars as grab bars.  Use non-skid mats or decals in the tub or shower.  If you need to sit down in the shower, use a plastic, non-slip stool.  Keep the floor dry. Clean up any water that spills on the floor as soon as it  happens.  Remove soap buildup in the tub or shower regularly.  Attach bath mats securely with double-sided non-slip rug tape.  Do not have throw rugs and other things on the floor that can make you trip. What can I do in the bedroom?  Use night lights.  Make sure that you have a light by your bed that is easy to reach.  Do not use any sheets or blankets that are too big for your bed. They should not hang down onto the floor.  Have a firm chair that has side arms. You can use this for support while you get dressed.  Do not have throw rugs and other things on the floor that can make you trip. What can I do in the kitchen?  Clean up any spills right away.  Avoid walking on wet floors.  Keep items that you use a lot in easy-to-reach places.  If you need to reach something above you, use a strong step stool that has a grab bar.  Keep electrical cords out of the way.  Do not use floor polish or wax that makes floors slippery. If you must use wax, use non-skid floor wax.  Do not have throw rugs and other things on the floor that  can make you trip. What can I do with my stairs?  Do not leave any items on the stairs.  Make sure that there are handrails on both sides of the stairs and use them. Fix handrails that are broken or loose. Make sure that handrails are as long as the stairways.  Check any carpeting to make sure that it is firmly attached to the stairs. Fix any carpet that is loose or worn.  Avoid having throw rugs at the top or bottom of the stairs. If you do have throw rugs, attach them to the floor with carpet tape.  Make sure that you have a light switch at the top of the stairs and the bottom of the stairs. If you do not have them, ask someone to add them for you. What else can I do to help prevent falls?  Wear shoes that:  Do not have high heels.  Have rubber bottoms.  Are comfortable and fit you well.  Are closed at the toe. Do not wear sandals.  If you  use a stepladder:  Make sure that it is fully opened. Do not climb a closed stepladder.  Make sure that both sides of the stepladder are locked into place.  Ask someone to hold it for you, if possible.  Clearly mark and make sure that you can see:  Any grab bars or handrails.  First and last steps.  Where the edge of each step is.  Use tools that help you move around (mobility aids) if they are needed. These include:  Canes.  Walkers.  Scooters.  Crutches.  Turn on the lights when you go into a dark area. Replace any light bulbs as soon as they burn out.  Set up your furniture so you have a clear path. Avoid moving your furniture around.  If any of your floors are uneven, fix them.  If there are any pets around you, be aware of where they are.  Review your medicines with your doctor. Some medicines can make you feel dizzy. This can increase your chance of falling. Ask your doctor what other things that you can do to help prevent falls. This information is not intended to replace advice given to you by your health care provider. Make sure you discuss any questions you have with your health care provider. Document Released: 01/20/2009 Document Revised: 09/01/2015 Document Reviewed: 04/30/2014 Elsevier Interactive Patient Education  2017 [employer].

## 2020-03-15 ENCOUNTER — Encounter (HOSPITAL_BASED_OUTPATIENT_CLINIC_OR_DEPARTMENT_OTHER): Payer: Self-pay | Admitting: *Deleted

## 2020-03-15 ENCOUNTER — Emergency Department (HOSPITAL_BASED_OUTPATIENT_CLINIC_OR_DEPARTMENT_OTHER)
Admission: EM | Admit: 2020-03-15 | Discharge: 2020-03-15 | Disposition: A | Discharge status: Home / Self Care | Payer: Worker's Compensation | Attending: Emergency Medicine | Admitting: Emergency Medicine

## 2020-03-15 ENCOUNTER — Emergency Department (HOSPITAL_BASED_OUTPATIENT_CLINIC_OR_DEPARTMENT_OTHER): Payer: Worker's Compensation

## 2020-03-15 ENCOUNTER — Other Ambulatory Visit: Payer: Self-pay

## 2020-03-15 DIAGNOSIS — Z79899 Other long term (current) drug therapy: Secondary | ICD-10-CM | POA: Insufficient documentation

## 2020-03-15 DIAGNOSIS — W19XXXA Unspecified fall, initial encounter: Secondary | ICD-10-CM

## 2020-03-15 DIAGNOSIS — Z7984 Long term (current) use of oral hypoglycemic drugs: Secondary | ICD-10-CM | POA: Insufficient documentation

## 2020-03-15 DIAGNOSIS — Z794 Long term (current) use of insulin: Secondary | ICD-10-CM | POA: Insufficient documentation

## 2020-03-15 DIAGNOSIS — S060X0A Concussion without loss of consciousness, initial encounter: Secondary | ICD-10-CM | POA: Diagnosis not present

## 2020-03-15 DIAGNOSIS — Y9389 Activity, other specified: Secondary | ICD-10-CM | POA: Insufficient documentation

## 2020-03-15 DIAGNOSIS — I2510 Atherosclerotic heart disease of native coronary artery without angina pectoris: Secondary | ICD-10-CM | POA: Diagnosis not present

## 2020-03-15 DIAGNOSIS — E1140 Type 2 diabetes mellitus with diabetic neuropathy, unspecified: Secondary | ICD-10-CM | POA: Insufficient documentation

## 2020-03-15 DIAGNOSIS — Z955 Presence of coronary angioplasty implant and graft: Secondary | ICD-10-CM | POA: Insufficient documentation

## 2020-03-15 DIAGNOSIS — Z87891 Personal history of nicotine dependence: Secondary | ICD-10-CM | POA: Diagnosis not present

## 2020-03-15 DIAGNOSIS — Y9289 Other specified places as the place of occurrence of the external cause: Secondary | ICD-10-CM | POA: Diagnosis not present

## 2020-03-15 DIAGNOSIS — W228XXA Striking against or struck by other objects, initial encounter: Secondary | ICD-10-CM | POA: Insufficient documentation

## 2020-03-15 DIAGNOSIS — I10 Essential (primary) hypertension: Secondary | ICD-10-CM | POA: Diagnosis not present

## 2020-03-15 DIAGNOSIS — Z7982 Long term (current) use of aspirin: Secondary | ICD-10-CM | POA: Insufficient documentation

## 2020-03-15 DIAGNOSIS — R519 Headache, unspecified: Secondary | ICD-10-CM | POA: Diagnosis present

## 2020-03-15 MED ORDER — ONDANSETRON 4 MG PO TBDP: 4.0000 mg | ORAL_TABLET | Freq: Three times a day (TID) | ORAL | 0 refills | Status: DC | PRN

## 2020-03-15 MED ORDER — ONDANSETRON 4 MG PO TBDP
4.0000 mg | ORAL_TABLET | Freq: Once | ORAL | Status: AC
  Administered 2020-03-15: 4 mg via ORAL
  Filled 2020-03-15: qty 1

## 2020-03-15 MED ORDER — ACETAMINOPHEN 325 MG PO TABS
650.0000 mg | ORAL_TABLET | Freq: Once | ORAL | Status: AC
  Administered 2020-03-15: 650 mg via ORAL
  Filled 2020-03-15: qty 2

## 2020-03-15 NOTE — ED Notes (Signed)
ED Provider at bedside. 

## 2020-03-15 NOTE — ED Provider Notes (Signed)
[place] EMERGENCY DEPARTMENT Provider Note   CSN: [id number] Arrival date & time: 03/15/20  1412     History Chief Complaint  Patient presents with  . Fall    Julie Osborne is a 69 y.o. female.  The history is provided by the patient.  Fall   Julie Osborne is a 69 y.o. female who presents to the Emergency Department complaining of fall. She presents the emergency department for evaluation of injuries following a mechanical fall that occurred around 930 this morning at work. She works at [employer]. When she was settling this morning at work she sat firmly down on a stool, which then broke and she fell backwards, striking her head on the door. There was no loss of consciousness. She complains of headache and pain in the area where she struck her head. She also has associated nausea, foggy thinking and occasional blurred vision. She does have a history of diabetes, coronary artery disease, hypertension, hyperlipidemia. She takes aspirin daily, no additional blood thinners. No loss of consciousness, no vomiting. Symptoms are moderate and constant nature.    Past Medical History:  Diagnosis Date  . Apnea   . Closed fracture of neck of left radius 10/14/2017  . Degenerative arthritis   . Diabetic neuropathy ([place]) 2017  . Diverticulosis 06/19/2013   sigmoid colon on colonoscopy  . Hyperlipidemia 2000  . Hypertension, benign 1996  . Lumbar radiculopathy    Severe L4/L5 radiculopathy by old records.   . Pes planus 12/17/2011  . Tenosynovitis of ankle 2017   post tibial tendon dysfunction stage 5 (PTTD)  . Uncontrolled diabetes mellitus ([place]) 2005    Patient Active Problem List   Diagnosis Date Noted  . Coronary artery disease involving native coronary artery without angina pectoris 02/11/2019  . CAD S/P percutaneous coronary angioplasty 12/08/2018  . Abnormal cardiovascular stress test 10/16/2018  . Crescendo angina ([place]) 10/16/2018  . Allergic  rhinitis 04/22/2018  . Lumbar back pain with radiculopathy affecting lower extremity 04/17/2018  . Morbid obesity ([place]) 11/29/2016  . Diabetes type 2, controlled ([place])   . OSA (obstructive sleep apnea)   . Hypertension, benign   . Hyperlipidemia   . Posterior tibial tendon dysfunction (PTTD) of left lower extremity 04/10/2015  . Spinal stenosis, lumbar region, without neurogenic claudication 02/06/2007    Past Surgical History:  Procedure Laterality Date  . CORONARY ANGIOPLASTY WITH STENT PLACEMENT  10/17/2018   DES x2/LAD  . EXPLORATORY LAPAROTOMY  1976   endometriosis  . MINOR HEMORRHOIDECTOMY  2006  . OTHER SURGICAL HISTORY Right 1975   venous ligation     OB History    Gravida  2   Para  2   Term  2   Preterm      AB      Living  2     SAB      TAB      Ectopic      Multiple      Live Births              Family History  Problem Relation Age of Onset  . COPD Mother   . Diabetes Mellitus II Mother   . Heart failure Mother   . Osteoarthritis Father   . Lung cancer Father 46  . Diabetes Sister   . Diabetes Brother   . Dementia Maternal Grandmother   . Heart disease Maternal Grandmother   . Heart disease Maternal Grandfather   . Stroke Maternal  Grandfather   . Heart failure Maternal Aunt   . Lung cancer Paternal Grandfather   . Lung cancer Paternal Aunt     Social History   Tobacco Use  . Smoking status: Former Smoker    Types: Cigarettes  . Smokeless tobacco: Never Used  Vaping Use  . Vaping Use: Never used  Substance Use Topics  . Alcohol use: No  . Drug use: No    Home Medications Prior to Admission medications   Medication Sig Start Date End Date Taking? Authorizing Provider  amLODipine (NORVASC) 10 MG tablet Take 1 tablet (10 mg total) by mouth daily. 12/24/19  Yes Kuneff, Renee A, DO  Ascorbic Acid (VITAMIN C) 100 MG tablet Take 1 tablet by mouth daily.    Yes [provider]  aspirin 81 MG chewable tablet Chew by  mouth daily.    Yes [provider]  calcium citrate-vitamin D (CITRACAL+D) 315-200 MG-UNIT tablet Take 1 tablet by mouth daily.    Yes [provider]  Calcium Polycarbophil (FIBER-CAPS PO) Take by mouth.    Yes [provider]  Cholecalciferol (VITAMIN D3) 75 MCG (3000 UT) TABS Take by mouth. 2,000 units daily   Yes [provider]  Cobalamin Combinations (VITAMIN B12-FOLIC ACID PO) Take 1 tablet by mouth daily.    Yes [provider]  Coenzyme Q10 (COQ10) 100 MG CAPS Take 1 tablet by mouth daily.    Yes [provider]  diclofenac Sodium (VOLTAREN) 1 % GEL Apply topically 4 times daily PRN 10/28/19  Yes Kuneff, Renee A, DO  DULoxetine (CYMBALTA) 20 MG capsule TAKE 1 CAPSULE BY MOUTH EVERY DAY 11/02/19  Yes Hulan Saas M, DO  fluticasone (FLONASE) 50 MCG/ACT nasal spray Place 2 sprays into both nostrils daily. 03/12/17  Yes Kuneff, Renee A, DO  glipiZIDE (GLUCOTROL) 10 MG tablet Take 1 tablet (10 mg total) by mouth 2 (two) times daily before a meal. 12/24/19  Yes Kuneff, Renee A, DO  insulin detemir (LEVEMIR) 100 UNIT/ML FlexPen Inject 50-60 Units into the skin at bedtime. 12/24/19  Yes Kuneff, Renee A, DO  Magnesium 400 MG CAPS Take 1 tablet by mouth daily.    Yes [provider]  Menaquinone-7 (VITAMIN K2) 100 MCG CAPS Take 1 tablet by mouth daily.    Yes [provider]  metFORMIN (GLUCOPHAGE) 1000 MG tablet Take 1 tablet (1,000 mg total) by mouth 2 (two) times daily with a meal. 12/24/19  Yes Kuneff, Renee A, DO  metoprolol succinate (TOPROL-XL) 50 MG 24 hr tablet Take 1 tablet (50 mg total) by mouth daily. 12/24/19  Yes Kuneff, Renee A, DO  Misc Natural Products (TART CHERRY ADVANCED PO) Take by mouth.    Yes [provider]  Multiple Vitamin (MULTIVITAMIN) capsule Take 1 capsule by mouth daily.    Yes [provider]  rosuvastatin (CRESTOR) 40 MG tablet Take 40 mg by mouth daily.    Yes [provider]  Turmeric (QC TUMERIC COMPLEX) 500 MG CAPS Take by mouth.    Yes [provider]  valsartan-hydrochlorothiazide (DIOVAN-HCT) 320-25 MG tablet Take 1 tablet by mouth daily. 12/24/19  Yes Kuneff, Renee A, DO  Zinc 25 MG TABS Take 1 tablet by mouth daily.    Yes [provider]  albuterol (VENTOLIN HFA) 108 (90 Base) MCG/ACT inhaler Inhale 1-2 puffs into the lungs every 6 (six) hours as needed for wheezing or shortness of breath. 12/24/19   Kuneff, Renee A, DO  glucose blood (  ONETOUCH VERIO) test strip Test blood sugar before meals and at bedtime 04/18/18   Kuneff, Renee A, DO  Insulin Pen Needle 31G X 5 MM MISC Use as directed 09/24/18   Kuneff, Renee A, DO  nitroGLYCERIN (NITROSTAT) 0.4 MG SL tablet Place under the tongue.  10/18/18   [provider]  ondansetron (ZOFRAN ODT) 4 MG disintegrating tablet Take 1 tablet (4 mg total) by mouth every 8 (eight) hours as needed for nausea or vomiting. 03/15/20   Quintella Reichert, MD  [facility] DELICA LANCETS 11X MISC 1 applicator by Does not apply route 4 (four) times daily -  before meals and at bedtime. 04/18/18   Kuneff, Renee A, DO    Allergies    Statins  Review of Systems   Review of Systems  All other systems reviewed and are negative.   Physical Exam Updated Vital Signs BP 130/73   Pulse 69   Temp 97.7 F (36.5 C) (Oral)   Resp 18   Ht 5\' 10"  (1.778 m)   Wt 122.5 kg   SpO2 98%   BMI 38.74 kg/m   Physical Exam Vitals and nursing note reviewed.  Constitutional:      Appearance: She is well-developed.  HENT:     Head: Normocephalic.     Comments: Tenderness with mild swelling to the posterior scalp. Small sub conjunctival hemorrhage to the left lateral I. Pupils equal round and reactive. EOM I. Cardiovascular:     Rate and Rhythm: Normal rate and regular rhythm.     Heart sounds: No murmur heard.   Pulmonary:     Effort: Pulmonary effort is normal. No respiratory distress.     Breath sounds:  Normal breath sounds.  Abdominal:     Palpations: Abdomen is soft.     Tenderness: There is no abdominal tenderness. There is no guarding or rebound.  Musculoskeletal:        General: No tenderness.     Cervical back: Neck supple.  Skin:    General: Skin is warm and dry.  Neurological:     Mental Status: She is alert and oriented to person, place, and time.  Psychiatric:        Behavior: Behavior normal.     ED Results / Procedures / Treatments   Labs (all labs ordered are listed, but only abnormal results are displayed) Labs Reviewed - No data to display  EKG None  Radiology CT Head Wo Contrast  Result Date: 03/15/2020 CLINICAL DATA:  Fall with headache EXAM: CT HEAD WITHOUT CONTRAST TECHNIQUE: Contiguous axial images were obtained from the base of the skull through the vertex without intravenous contrast. COMPARISON:  None. FINDINGS: Brain: No acute territorial infarction, hemorrhage or intracranial mass. Mild atrophy. Mild hypodensity in the white matter likely chronic small vessel ischemic change. Nonenlarged ventricles. Vascular: No hyperdense vessels. Vertebral and carotid vascular calcification Skull: Normal. Negative for fracture or focal lesion. Sinuses/Orbits: No acute finding. Other: None IMPRESSION: 1. No CT evidence for acute intracranial abnormality. 2. Atrophy and mild chronic small vessel ischemic changes of the white matter. Electronically Signed   By: Donavan Foil M.D.   On: 03/15/2020 17:15    Procedures Procedures (including critical care time)  Medications Ordered in ED Medications  ondansetron (ZOFRAN-ODT) disintegrating tablet 4 mg (4 mg Oral Given 03/15/20 1608)  acetaminophen (TYLENOL) tablet 650 mg (650 mg Oral Given 03/15/20 1609)    ED Course  I have reviewed the triage vital signs and the nursing notes.  Pertinent labs & imaging results that were available during my care of the patient were reviewed by me and considered in my medical decision  making (see chart for details).    MDM Rules/Calculators/A&P                         patient with history of hypertension, diabetes here for evaluation of injuries following a mechanical fall. She does have nausea, blurred vision following the fall. Imaging is negative for serious intracranial abnormality. On repeat assessment after medications she is feeling partially improved. Discussed with patient home care for concussion. Discussed outpatient follow-up and return precautions.  Final Clinical Impression(s) / ED Diagnoses Final diagnoses:  Fall, initial encounter  Concussion without loss of consciousness, initial encounter    Rx / DC Orders ED Discharge Orders         Ordered    ondansetron (ZOFRAN ODT) 4 MG disintegrating tablet  Every 8 hours PRN        03/15/20 1722           Quintella Reichert, MD 03/15/20 1737

## 2020-03-15 NOTE — ED Triage Notes (Addendum)
[place] this morning and hit her head.  C/o of headache and blurred vision since the fall

## 2020-03-15 NOTE — ED Notes (Signed)
AVS reviewed with client along with care at home re: concussion. Also discussed Rx by EDP and also discussed when the need would arise to return to the ED department, copy of AVS provided

## 2020-03-21 ENCOUNTER — Encounter: Payer: Self-pay | Admitting: Gastroenterology

## 2020-03-22 ENCOUNTER — Encounter: Payer: Medicare Other | Admitting: Gastroenterology

## 2020-03-24 ENCOUNTER — Ambulatory Visit: Payer: Medicare Other | Admitting: Family Medicine

## 2020-03-30 ENCOUNTER — Encounter: Payer: Self-pay | Admitting: Family Medicine

## 2020-03-30 ENCOUNTER — Ambulatory Visit (INDEPENDENT_AMBULATORY_CARE_PROVIDER_SITE_OTHER): Payer: Medicare Other | Admitting: Family Medicine

## 2020-03-30 ENCOUNTER — Other Ambulatory Visit: Payer: Self-pay

## 2020-03-30 VITALS — BP 135/72 | HR 69 | Temp 98.8°F | Ht 70.0 in | Wt 273.0 lb

## 2020-03-30 DIAGNOSIS — Z794 Long term (current) use of insulin: Secondary | ICD-10-CM

## 2020-03-30 DIAGNOSIS — I10 Essential (primary) hypertension: Secondary | ICD-10-CM

## 2020-03-30 DIAGNOSIS — E1149 Type 2 diabetes mellitus with other diabetic neurological complication: Secondary | ICD-10-CM

## 2020-03-30 DIAGNOSIS — I2510 Atherosclerotic heart disease of native coronary artery without angina pectoris: Secondary | ICD-10-CM | POA: Diagnosis not present

## 2020-03-30 DIAGNOSIS — E785 Hyperlipidemia, unspecified: Secondary | ICD-10-CM | POA: Diagnosis not present

## 2020-03-30 DIAGNOSIS — Z9861 Coronary angioplasty status: Secondary | ICD-10-CM

## 2020-03-30 DIAGNOSIS — F418 Other specified anxiety disorders: Secondary | ICD-10-CM

## 2020-03-30 DIAGNOSIS — E1140 Type 2 diabetes mellitus with diabetic neuropathy, unspecified: Secondary | ICD-10-CM

## 2020-03-30 HISTORY — DX: Other specified anxiety disorders: F41.8

## 2020-03-30 LAB — POCT GLYCOSYLATED HEMOGLOBIN (HGB A1C)
HbA1c POC (<> result, manual entry): 8.1 % (ref 4.0–5.6)
HbA1c, POC (controlled diabetic range): 8.1 % — AB (ref 0.0–7.0)
HbA1c, POC (prediabetic range): 8.1 % — AB (ref 5.7–6.4)
Hemoglobin A1C: 8.1 % — AB (ref 4.0–5.6)

## 2020-03-30 MED ORDER — ESCITALOPRAM OXALATE 10 MG PO TABS: 10.0000 mg | ORAL_TABLET | Freq: Every day | ORAL | 1 refills | Status: DC

## 2020-03-30 MED ORDER — AMLODIPINE BESYLATE 10 MG PO TABS
10.0000 mg | ORAL_TABLET | Freq: Every day | ORAL | 1 refills | Status: DC
Start: 2020-03-30 — End: 2020-08-16

## 2020-03-30 MED ORDER — GLIPIZIDE 10 MG PO TABS
10.0000 mg | ORAL_TABLET | Freq: Two times a day (BID) | ORAL | 1 refills | Status: DC
Start: 2020-03-30 — End: 2020-08-16

## 2020-03-30 MED ORDER — METOPROLOL SUCCINATE ER 50 MG PO TB24
50.0000 mg | ORAL_TABLET | Freq: Every day | ORAL | 1 refills | Status: DC
Start: 2020-03-30 — End: 2020-08-16

## 2020-03-30 MED ORDER — METFORMIN HCL 1000 MG PO TABS: 1000.0000 mg | ORAL_TABLET | Freq: Two times a day (BID) | ORAL | 1 refills | Status: DC

## 2020-03-30 MED ORDER — INSULIN DETEMIR 100 UNIT/ML FLEXPEN
60.0000 [IU] | PEN_INJECTOR | Freq: Every day | SUBCUTANEOUS | 5 refills | Status: DC
Start: 2020-03-30 — End: 2020-08-16

## 2020-03-30 MED ORDER — VALSARTAN-HYDROCHLOROTHIAZIDE 320-25 MG PO TABS
1.0000 | ORAL_TABLET | Freq: Every day | ORAL | 1 refills | Status: DC
Start: 2020-03-30 — End: 2020-08-16

## 2020-03-30 NOTE — Progress Notes (Signed)
This visit occurred during the SARS-CoV-2 public health emergency.  Safety protocols were in place, including screening questions prior to the visit, additional usage of staff PPE, and extensive cleaning of exam room while observing appropriate contact time as indicated for disinfecting solutions.     Julie Osborne , [birth year], 69 y.o., female MRN: [id number] Patient Care Team    Relationship Specialty Notifications Start End  Ma Hillock, DO PCP - General Family Medicine  01/13/16   Leda Roys, DPM Referring Physician Podiatry  12/24/19     Chief Complaint  Patient presents with  . Follow-up    CMC; pt is fasting    Subjective: Pt presents for an OV for routine chronic condition follow up.  Diabetes/morbid obesity:  Pt reportscompliance with metformin 1000 mg BID, glipizide 10 mg BID,  and Levemir  50-60 units nightly (did not increase to 60 as discussed last visit).Patient denies dizziness, hyperglycemic or hypoglycemic events. Patient denies numbness, tingling in the extremities or nonhealing wounds of feet.  Patient reports her fasting sugars have been 140-160 She does have neuropathy of toes and hands on occasions. She is compliant with  gabapentin 300/400. Onset: 2005 PNA series: completed Flu shot: UTD 2021 today (recommneded yearly) Foot exam: 11/2019>  referral back to orthopedics placed last visit since she was having discomfort secondary to pressure on her second toes from mild mallet deformity and recent large development of blister on her left lateral foot.   Eye exam:2021- Ophth on [place]. Dr. Dennie Fetters A1c: 7.9--> 7.5 --> 6.2 --> 7.6--> 6.4--> 6.8 >> 7.6>>6.8>>7.9>8.2> 8.1 Microallb: On ARB  HTN/hyperlipidemia: Pt reports compliance  with amlodipine 10, valsartan-hctz 10-320-25, metoprolol 50 mg daily, Crestor and ASA 81 today.Patient denies chest pain, shortness of breath, dizziness or lower extremity edema. Blood pressures are normal at home.    Anxiety: Pt reports since her MIL moved in with them a few weeks ago she finds herself not able to control her emotions. She is having panic attacks in the middle of the night. She feels anxious and has not been able to find a way to cope with the changes. She states her MIL has some cognitive decline and is unable to live on her own. [name] reports she has always had difficulty getting along with her MIL, but it is more difficult now since she is living with her.   Depression screen [facility] 2/9 03/30/2020 02/24/2020 02/26/2018 08/16/2017 08/13/2016  Decreased Interest 0 0 0 0 0  Down, Depressed, Hopeless 1 0 0 0 0  PHQ - 2 Score 1 0 0 0 0  Altered sleeping 3 - - - -  Tired, decreased energy 3 - - - -  Change in appetite 3 - - - -  Feeling bad or failure about yourself  3 - - - -  Trouble concentrating 3 - - - -  Moving slowly or fidgety/restless 0 - - - -  Suicidal thoughts 0 - - - -  PHQ-9 Score 16 - - - -   GAD 7 : Generalized Anxiety Score 03/30/2020  Nervous, Anxious, on Edge 2  Control/stop worrying 2  Worry too much - different things 0  Trouble relaxing 3  Restless 0  Easily annoyed or irritable 3  Afraid - awful might happen 0  Total GAD 7 Score 10    Allergies  Allergen Reactions  . Statins     Lipitor,simvatatin, pravastatin.welchol cause myalgia   Social History   Social History Narrative   Married  to [employer], 2 children.    BS, RN retired now Printmaker.    Drinks caffeine, uses herbal remedies. Daily vitmain use.   Wears her seatbelt, smoke detector in the home.    exercises routinely.    She feels safe in her relationships.    Past Medical History:  Diagnosis Date  . Apnea   . Closed fracture of neck of left radius 10/14/2017  . Concussion   . Degenerative arthritis   . Diabetic neuropathy ([place]) 2017  . Diverticulosis 06/19/2013   sigmoid colon on colonoscopy  . Hyperlipidemia 2000  . Hypertension, benign 1996  . Lumbar radiculopathy    Severe L4/L5  radiculopathy by old records.   . Pes planus 12/17/2011  . Tenosynovitis of ankle 2017   post tibial tendon dysfunction stage 5 (PTTD)  . Uncontrolled diabetes mellitus ([place]) 2005   Past Surgical History:  Procedure Laterality Date  . CORONARY ANGIOPLASTY WITH STENT PLACEMENT  10/17/2018   DES x2/LAD  . EXPLORATORY LAPAROTOMY  1976   endometriosis  . MINOR HEMORRHOIDECTOMY  2006  . OTHER SURGICAL HISTORY Right 1975   venous ligation   Family History  Problem Relation Age of Onset  . COPD Mother   . Diabetes Mellitus II Mother   . Heart failure Mother   . Osteoarthritis Father   . Lung cancer Father 32  . Diabetes Sister   . Diabetes Brother   . Dementia Maternal Grandmother   . Heart disease Maternal Grandmother   . Heart disease Maternal Grandfather   . Stroke Maternal Grandfather   . Heart failure Maternal Aunt   . Lung cancer Paternal Grandfather   . Lung cancer Paternal Aunt    Allergies as of 03/30/2020      Reactions   Statins    Lipitor,simvatatin, pravastatin.welchol cause myalgia      Medication List       Accurate as of March 30, 2020 10:41 AM. If you have any questions, ask your nurse or doctor.        albuterol 108 (90 Base) MCG/ACT inhaler Commonly known as: VENTOLIN HFA Inhale 1-2 puffs into the lungs every 6 (six) hours as needed for wheezing or shortness of breath.   amLODipine 10 MG tablet Commonly known as: NORVASC Take 1 tablet (10 mg total) by mouth daily.   aspirin 81 MG chewable tablet Chew by mouth daily.   calcium citrate-vitamin D 315-200 MG-UNIT tablet Commonly known as: CITRACAL+D Take 1 tablet by mouth daily.   CoQ10 100 MG Caps Take 1 tablet by mouth daily.   diclofenac Sodium 1 % Gel Commonly known as: Voltaren Apply topically 4 times daily PRN   DULoxetine 20 MG capsule Commonly known as: CYMBALTA TAKE 1 CAPSULE BY MOUTH EVERY DAY   escitalopram 10 MG tablet Commonly known as: Lexapro Take 1 tablet (10 mg total)  by mouth daily. Started by: [name], DO   FIBER-CAPS PO Take by mouth.   fluticasone 50 MCG/ACT nasal spray Commonly known as: FLONASE Place 2 sprays into both nostrils daily.   glipiZIDE 10 MG tablet Commonly known as: GLUCOTROL Take 1 tablet (10 mg total) by mouth 2 (two) times daily before a meal.   glucose blood test strip Commonly known as: OneTouch Verio Test blood sugar before meals and at bedtime   insulin detemir 100 UNIT/ML FlexPen Commonly known as: LEVEMIR Inject 60 Units into the skin at bedtime. What changed: how much to take Changed by: [name], DO   Insulin  Pen Needle 31G X 5 MM Misc Use as directed   Magnesium 400 MG Caps Take 1 tablet by mouth daily.   metFORMIN 1000 MG tablet Commonly known as: GLUCOPHAGE Take 1 tablet (1,000 mg total) by mouth 2 (two) times daily with a meal.   metoprolol succinate 50 MG 24 hr tablet Commonly known as: TOPROL-XL Take 1 tablet (50 mg total) by mouth daily.   multivitamin capsule Take 1 capsule by mouth daily.   nitroGLYCERIN 0.4 MG SL tablet Commonly known as: NITROSTAT Place under the tongue.   ondansetron 4 MG disintegrating tablet Commonly known as: Zofran ODT Take 1 tablet (4 mg total) by mouth every 8 (eight) hours as needed for nausea or vomiting.   OneTouch Delica Lancets 26S Misc 1 applicator by Does not apply route 4 (four) times daily -  before meals and at bedtime.   rosuvastatin 40 MG tablet Commonly known as: CRESTOR Take 40 mg by mouth daily.   TART CHERRY ADVANCED PO Take by mouth.   Turmeric 500 MG Caps Take by mouth.   valsartan-hydrochlorothiazide 320-25 MG tablet Commonly known as: DIOVAN-HCT Take 1 tablet by mouth daily.   VITAMIN B12-FOLIC ACID PO Take 1 tablet by mouth daily.   vitamin C 100 MG tablet Take 1 tablet by mouth daily.   Vitamin D3 75 MCG (3000 UT) Tabs Take by mouth. 2,000 units daily   Vitamin K2 100 MCG Caps Take 1 tablet by mouth daily.    Zinc 25 MG Tabs Take 1 tablet by mouth daily.       All past medical history, surgical history, allergies, family history, immunizations andmedications were updated in the EMR today and reviewed under the history and medication portions of their EMR.     ROS: Negative, with the exception of above mentioned in HPI   Objective:  BP 135/72   Pulse 69   Temp 98.8 F (37.1 C) (Oral)   Ht 5\' 10"  (1.778 m)   Wt 273 lb (123.8 kg)   SpO2 100%   BMI 39.17 kg/m  Body mass index is 39.17 kg/m. Gen: Afebrile. No acute distress. Nontoxic. Upset today.  HENT: AT. [place]. Eyes:Pupils Equal Round Reactive to light, Extraocular movements intact,  Conjunctiva without redness, discharge or icterus. Neck/lymp/endocrine: Supple,no lymphadenopathy, no thyromegaly CV: RRR no murmur, no edema, +2/4 P posterior tibialis pulses Chest: CTAB, no wheeze or crackles Skin: no rashes, purpura or petechiae.  Neuro:  Normal gait. PERLA. EOMi. Alert. Oriented x3 Psych: mildly upset today, otherwise Normal affect, dress and demeanor. Normal speech. Normal thought content and judgment.  No exam data present No results found. Results for orders placed or performed in visit on 03/30/20 (from the past 24 hour(s))  POCT HgB A1C     Status: Abnormal   Collection Time: 03/30/20  9:45 AM  Result Value Ref Range   Hemoglobin A1C 8.1 (A) 4.0 - 5.6 %   HbA1c POC (<> result, manual entry) 8.1 4.0 - 5.6 %   HbA1c, POC (prediabetic range) 8.1 (A) 5.7 - 6.4 %   HbA1c, POC (controlled diabetic range) 8.1 (A) 0.0 - 7.0 %    Assessment/Plan: Jacqulynn Shappell is a 69 y.o. female present for OV for  Hypertension, benign/HLD/coronary artery disease -stable.  -continue  amlodipine 10 mg daily.   -continue valsartan/HCTZ 320-12.5.   -continue  metoprolol to 50 mg  - low sodium, exercise.  - routine exercise encouraged.  - f/u 3 mos [place]  Morbid obesity (HCC)/Other diabetic  neurological complication associated with type 2  diabetes mellitus ([place]) -not at goal.  Continue Metformin 1000 mg twice daily Continue  glipizide  10 mg twice daily Increase  Levemir to  60 units nightly- and add 2 units every 3 days until FBG < 120. Continue gabapentin (see below for dosage) PNA series: completed Flu shot: UTD 2021 today (recommneded yearly) Foot exam: 11/2019>  referral back to orthopedics placed last visit since she was having discomfort secondary to pressure on her second toes from mild mallet deformity and recent large development of blister on her left lateral foot.   Eye exam:2021- Ophth on [place]. Dr. Dennie Fetters A1c: 7.9--> 7.5 --> 6.2 --> 7.6--> 6.4--> 6.8 >> 7.6>>6.8>>7.9>8.2> 8.1 Microallb: On ARB   Situational anxiety New problem Lengthy discussion today surrounding her increased PHQ and GAD scores. Emotions are heightened  since her MIL has moved in with them. We discussed options and she would like to move forward with counseling and meds.  - start lexapro 10 mg qd - Ambulatory referral to Psychology    Reviewed expectations re: course of current medical issues.  Discussed self-management of symptoms.  Outlined signs and symptoms indicating need for more acute intervention.  Patient verbalized understanding and all questions were answered.  Patient received an After-Visit Summary.    Orders Placed This Encounter  Procedures  . Ambulatory referral to Psychology  . POCT HgB A1C   Meds ordered this encounter  Medications  . escitalopram (LEXAPRO) 10 MG tablet    Sig: Take 1 tablet (10 mg total) by mouth daily.    Dispense:  90 tablet    Refill:  1  . amLODipine (NORVASC) 10 MG tablet    Sig: Take 1 tablet (10 mg total) by mouth daily.    Dispense:  90 tablet    Refill:  1    Please discontinue amlodipine/valsartan/HCTZ combo pill  . glipiZIDE (GLUCOTROL) 10 MG tablet    Sig: Take 1 tablet (10 mg total) by mouth 2 (two) times daily before a meal.    Dispense:  180 tablet    Refill:  1   . metFORMIN (GLUCOPHAGE) 1000 MG tablet    Sig: Take 1 tablet (1,000 mg total) by mouth 2 (two) times daily with a meal.    Dispense:  180 tablet    Refill:  1  . metoprolol succinate (TOPROL-XL) 50 MG 24 hr tablet    Sig: Take 1 tablet (50 mg total) by mouth daily.    Dispense:  90 tablet    Refill:  1  . valsartan-hydrochlorothiazide (DIOVAN-HCT) 320-25 MG tablet    Sig: Take 1 tablet by mouth daily.    Dispense:  90 tablet    Refill:  1  . insulin detemir (LEVEMIR) 100 UNIT/ML FlexPen    Sig: Inject 60 Units into the skin at bedtime.    Dispense:  30 mL    Refill:  5    Referral Orders     Ambulatory referral to Psychology   > 45 Minutes was dedicated to this patient's encounter to include pre-visit review of chart, face-to-face time with patient and post-visit work- which include documentation and prescribing medications and/or ordering test when necessary.    Note is dictated utilizing voice recognition software. Although note has been proof read prior to signing, occasional typographical errors still can be missed. If any questions arise, please do not hesitate to call for verification.   electronically signed by:  [name], DO  [facility] -  OR

## 2020-03-30 NOTE — Patient Instructions (Addendum)
°  Next appt in 3 mos.  Increase insulin to 60 units - increase by 2 units every 3 nights until fasting glucose is < 120 in the morning.   If you are age 67 or older, your body mass index should be between 23-30. Your Body mass index is Body mass index is 39.17 kg/m.Marland Kitchen If this is above the aforementioned range listed, you are consider obese by medical definition and standards. Routine daily exercise and dietary modifications are encouraged. If you would like additional guidance on weight loss, please make an appointment for weight loss counseling - must be an appointment dedicate to weight loss counseling alone. I would be happy to help you.   If you are age 70 or younger, your body mass index should be between 19-25. Your Body mass index is Body mass index is 39.17 kg/m. Marland Kitchen If this is above the aformentioned range listed, you are consider obese by medical definition and standards. Routine daily exercise and dietary modifications are encouraged. If you would like additional guidance on weight loss, please make an appointment for weight loss counseling - must be an appointment dedicate to weight loss counseling alone. I would be happy to help you.

## 2020-04-14 ENCOUNTER — Telehealth: Payer: Self-pay | Admitting: Family Medicine

## 2020-04-14 ENCOUNTER — Telehealth: Payer: Self-pay

## 2020-04-14 NOTE — Telephone Encounter (Signed)
Will it be okay to fax OV notes as requested for device

## 2020-04-14 NOTE — Telephone Encounter (Signed)
Routing to team pool

## 2020-04-14 NOTE — Telephone Encounter (Signed)
Pt is aware to fill meds

## 2020-04-14 NOTE — Telephone Encounter (Signed)
Received call from Summer at [facility] regarding RX for [employer]. She states she needs clinic notes with diabetes diagnosis faxed. I do not see this on patient's med list. Please fax requested info to [phone] if appropriate. Summer gave a call back number of [phone].

## 2020-04-14 NOTE — Telephone Encounter (Signed)
Had question in regards to possible duplicate therapy. Pt states that she is on to SSRI and was informed by pharmacy to check with her PCP. Pt is on Lexapro and Cymbalta.

## 2020-04-14 NOTE — Telephone Encounter (Signed)
OV Notes printed on desk

## 2020-04-14 NOTE — Telephone Encounter (Signed)
Called pt and verified pt is using a [employer]

## 2020-04-14 NOTE — Telephone Encounter (Signed)
Med is not on current med list please advise

## 2020-04-14 NOTE — Telephone Encounter (Signed)
Freestyle Josephine Igo is a glucose monitoring device. Confused as to what they are requesting.  Are they requesting she have a prescription already requesting my office visit notes which I find odd.  Patient does have diabetes and we can write that on the prescription.   Is clarify her need

## 2020-04-14 NOTE — Telephone Encounter (Signed)
Again, a company asking for a patient's personal office visit notes is odd.  Please clarify with the company that is what they are needing or do they need a prescription.  If company states they need the office visit notes, we will need to have the patient's permission before sending office visit notes. If company states they need a prescription, please asked them to be specific on how it should be worded.  Thanks

## 2020-04-14 NOTE — Telephone Encounter (Signed)
I am aware she is on 2 SSRIs.  Please ask pharmacy to fill the prescribed medicine

## 2020-04-15 NOTE — Telephone Encounter (Signed)
OV notes faxed  

## 2020-04-15 NOTE — Telephone Encounter (Signed)
Spoke with pt and received permission for sending OV notes

## 2020-04-15 NOTE — Telephone Encounter (Signed)
Spoke with Julie Osborne who stated she just need the most recent OV notes with DM dx included.

## 2020-05-02 ENCOUNTER — Other Ambulatory Visit: Payer: Self-pay | Admitting: Family Medicine

## 2020-05-02 ENCOUNTER — Encounter: Payer: Self-pay | Admitting: Family Medicine

## 2020-05-02 NOTE — Telephone Encounter (Signed)
Sent patient MyChart note to request that she try to get medication filled through PCP.

## 2020-05-03 NOTE — Telephone Encounter (Signed)
I am willing to take over management of her Cymbalta at her next appointment.  However, in order to appropriately take over management of a medication appointment is needed.  I would encourage her to see if Dr. Tamala Julian is willing to provide her with enough to get her to her next appointment with [place].

## 2020-05-03 NOTE — Telephone Encounter (Signed)
RF request for cymbalta LOV:03/30/20 Next ov: recommended 3 month Last written: 11/02/19 (90,0) by Dr. Tamala Julian

## 2020-05-08 ENCOUNTER — Encounter: Payer: Self-pay | Admitting: Family Medicine

## 2020-05-11 ENCOUNTER — Encounter: Payer: Self-pay | Admitting: *Deleted

## 2020-05-11 NOTE — Telephone Encounter (Signed)
Previous colonoscopy report from 2015 reviewed.  Notable for diverticulosis and fair prep.  Previous Endoscopist recommended repeat in 5 years, presumably due to quality of prep.  Based on the previous recommendation of 5-year interval and recorded fair prep, recommend repeat colonoscopy now.  If unremarkable/no polyps and good quality bowel prep, can certainly increase continued screening/surveillance interval as appropriate.  Thanks.

## 2020-05-11 NOTE — Telephone Encounter (Signed)
Patient in for a pre visit for a Screening.   Last colonoscopy which was a screening in 2015 was normal except tics. ( in [place])    No history of polyps, no family history, not having any issues.  I advised patient I would reach out to you, but thought that perhaps she was sent here too soon by her PCP. Please advise.

## 2020-05-12 ENCOUNTER — Telehealth: Payer: Self-pay | Admitting: *Deleted

## 2020-05-12 NOTE — Telephone Encounter (Signed)
Opened in error

## 2020-05-12 NOTE — Telephone Encounter (Signed)
Appointment made for virtual Previsit Monday 06/06/2020 @ 9:00 am Sent S[employer] instructions via MyChart for patient to review and be aware of restricted foods.

## 2020-05-15 ENCOUNTER — Encounter: Payer: Self-pay | Admitting: Family Medicine

## 2020-05-23 ENCOUNTER — Ambulatory Visit (INDEPENDENT_AMBULATORY_CARE_PROVIDER_SITE_OTHER): Payer: Medicare Other | Admitting: Psychology

## 2020-05-23 DIAGNOSIS — F4323 Adjustment disorder with mixed anxiety and depressed mood: Secondary | ICD-10-CM

## 2020-05-25 ENCOUNTER — Encounter: Payer: Medicare Other | Admitting: Gastroenterology

## 2020-05-25 ENCOUNTER — Encounter: Payer: Self-pay | Admitting: Family Medicine

## 2020-05-25 NOTE — Telephone Encounter (Signed)
Patient is requesting an excuse for work due to an acute issue.  Please schedule her for an office visit if desiring work excuse, we would have to evaluate her in order to make recommendations.

## 2020-05-27 ENCOUNTER — Other Ambulatory Visit: Payer: Self-pay

## 2020-05-27 ENCOUNTER — Encounter: Payer: Self-pay | Admitting: Family Medicine

## 2020-05-27 ENCOUNTER — Ambulatory Visit (INDEPENDENT_AMBULATORY_CARE_PROVIDER_SITE_OTHER): Payer: Medicare Other | Admitting: Family Medicine

## 2020-05-27 VITALS — BP 135/72 | HR 74 | Temp 98.0°F | Ht 70.0 in | Wt 276.0 lb

## 2020-05-27 DIAGNOSIS — E1149 Type 2 diabetes mellitus with other diabetic neurological complication: Secondary | ICD-10-CM | POA: Diagnosis not present

## 2020-05-27 DIAGNOSIS — M5416 Radiculopathy, lumbar region: Secondary | ICD-10-CM

## 2020-05-27 DIAGNOSIS — M14672 Charcot's joint, left ankle and foot: Secondary | ICD-10-CM | POA: Insufficient documentation

## 2020-05-27 DIAGNOSIS — E1140 Type 2 diabetes mellitus with diabetic neuropathy, unspecified: Secondary | ICD-10-CM

## 2020-05-27 DIAGNOSIS — M76822 Posterior tibial tendinitis, left leg: Secondary | ICD-10-CM

## 2020-05-27 DIAGNOSIS — M48061 Spinal stenosis, lumbar region without neurogenic claudication: Secondary | ICD-10-CM

## 2020-05-27 DIAGNOSIS — Z794 Long term (current) use of insulin: Secondary | ICD-10-CM

## 2020-05-27 DIAGNOSIS — M21962 Unspecified acquired deformity of left lower leg: Secondary | ICD-10-CM | POA: Diagnosis not present

## 2020-05-27 MED ORDER — DULOXETINE HCL 40 MG PO CPEP
40.0000 mg | ORAL_CAPSULE | Freq: Every day | ORAL | 1 refills | Status: DC
Start: 2020-05-27 — End: 2020-08-16

## 2020-05-27 NOTE — Progress Notes (Signed)
This visit occurred during the SARS-CoV-2 public health emergency.  Safety protocols were in place, including screening questions prior to the visit, additional usage of staff PPE, and extensive cleaning of exam room while observing appropriate contact time as indicated for disinfecting solutions.    Julie Osborne , 01/02/1951, 70 y.o., female MRN: [id number] Patient Care Team    Relationship Specialty Notifications Start End  Ma Hillock, DO PCP - General Family Medicine  01/13/16   Leda Roys, DPM Referring Physician Podiatry  12/24/19     Chief Complaint  Patient presents with  . Peripheral Neuropathy     Subjective: Pt presents for an OV with complaints of worsening left ankle discomfort over the last 3-4 days.  Patient has had chronic left ankle and foot discomfort along with tendinitis and foot deformity.  She is a diabetic.  She reports occasionally her foot and ankle will swell and be uncomfortable, making it difficult for her to walk and function for about a week.  She states that this time she will notice some bruising sometimes develop over her medial left foot.  She has seen multiple sports med and podiatrist over the years for her foot.  05/30/2016: IMPRESSION: 1. Diffuse soft tissue swelling. Slight deformity noted the distal tip of the lateral malleolus, this is possibly a fracture, possibly old. Clinical correlation suggested.  2. Diffuse degenerative changes noted about the ankle and foot. Scratched Corticated small bony densities noted adjacent to the medial malleolus and navicula, most likely sites of old fracture or related to degenerative change. Developing Charcot's ankle/ foot cannot be excluded.   Depression screen [facility] 2/9 03/30/2020 02/24/2020 02/26/2018 08/16/2017 08/13/2016  Decreased Interest 0 0 0 0 0  Down, Depressed, Hopeless 1 0 0 0 0  PHQ - 2 Score 1 0 0 0 0  Altered sleeping 3 - - - -  Tired, decreased energy 3 - - - -  Change in  appetite 3 - - - -  Feeling bad or failure about yourself  3 - - - -  Trouble concentrating 3 - - - -  Moving slowly or fidgety/restless 0 - - - -  Suicidal thoughts 0 - - - -  PHQ-9 Score 16 - - - -    Allergies  Allergen Reactions  . Statins     Lipitor,simvatatin, pravastatin.welchol cause myalgia   Social History   Social History Narrative   Married to [employer], 2 children.    BS, RN retired now Printmaker.    Drinks caffeine, uses herbal remedies. Daily vitmain use.   Wears her seatbelt, smoke detector in the home.    exercises routinely.    She feels safe in her relationships.    Past Medical History:  Diagnosis Date  . Apnea   . Closed fracture of neck of left radius 10/14/2017  . Concussion   . Degenerative arthritis   . Diabetic neuropathy ([place]) 2017  . Diverticulosis 06/19/2013   sigmoid colon on colonoscopy  . Hyperlipidemia 2000  . Hypertension, benign 1996  . Lumbar radiculopathy    Severe L4/L5 radiculopathy by old records.   . Pes planus 12/17/2011  . Tenosynovitis of ankle 2017   post tibial tendon dysfunction stage 5 (PTTD)  . Uncontrolled diabetes mellitus ([place]) 2005   Past Surgical History:  Procedure Laterality Date  . CORONARY ANGIOPLASTY WITH STENT PLACEMENT  10/17/2018   DES x2/LAD  . EXPLORATORY LAPAROTOMY  1976   endometriosis  . MINOR HEMORRHOIDECTOMY  2006  . OTHER SURGICAL HISTORY Right 1975   venous ligation   Family History  Problem Relation Age of Onset  . COPD Mother   . Diabetes Mellitus II Mother   . Heart failure Mother   . Osteoarthritis Father   . Lung cancer Father 92  . Diabetes Sister   . Diabetes Brother   . Dementia Maternal Grandmother   . Heart disease Maternal Grandmother   . Heart disease Maternal Grandfather   . Stroke Maternal Grandfather   . Heart failure Maternal Aunt   . Lung cancer Paternal Grandfather   . Lung cancer Paternal Aunt    Allergies as of 05/27/2020      Reactions   Statins     Lipitor,simvatatin, pravastatin.welchol cause myalgia      Medication List       Accurate as of May 27, 2020  4:03 PM. If you have any questions, ask your nurse or doctor.        STOP taking these medications   escitalopram 10 MG tablet Commonly known as: Lexapro Stopped by: [name], DO   ondansetron 4 MG disintegrating tablet Commonly known as: Zofran ODT Stopped by: [name], DO     TAKE these medications   albuterol 108 (90 Base) MCG/ACT inhaler Commonly known as: VENTOLIN HFA Inhale 1-2 puffs into the lungs every 6 (six) hours as needed for wheezing or shortness of breath.   amLODipine 10 MG tablet Commonly known as: NORVASC Take 1 tablet (10 mg total) by mouth daily.   aspirin 81 MG chewable tablet Chew by mouth daily.   calcium citrate-vitamin D 315-200 MG-UNIT tablet Commonly known as: CITRACAL+D Take 1 tablet by mouth daily.   CoQ10 100 MG Caps Take 1 tablet by mouth daily.   diclofenac Sodium 1 % Gel Commonly known as: Voltaren Apply topically 4 times daily PRN   DULoxetine HCl 40 MG Cpep Take 40 mg by mouth daily. What changed:   medication strength  how much to take Changed by: [name], DO   FIBER-CAPS PO Take by mouth.   fluticasone 50 MCG/ACT nasal spray Commonly known as: FLONASE Place 2 sprays into both nostrils daily.   glipiZIDE 10 MG tablet Commonly known as: GLUCOTROL Take 1 tablet (10 mg total) by mouth 2 (two) times daily before a meal.   glucose blood test strip Commonly known as: OneTouch Verio Test blood sugar before meals and at bedtime   insulin detemir 100 UNIT/ML FlexPen Commonly known as: LEVEMIR Inject 60 Units into the skin at bedtime.   Insulin Pen Needle 31G X 5 MM Misc Use as directed   Magnesium 400 MG Caps Take 1 tablet by mouth daily.   metFORMIN 1000 MG tablet Commonly known as: GLUCOPHAGE Take 1 tablet (1,000 mg total) by mouth 2 (two) times daily with a meal.   metoprolol  succinate 50 MG 24 hr tablet Commonly known as: TOPROL-XL Take 1 tablet (50 mg total) by mouth daily.   multivitamin capsule Take 1 capsule by mouth daily.   nitroGLYCERIN 0.4 MG SL tablet Commonly known as: NITROSTAT Place under the tongue.   OneTouch Delica Lancets 16X Misc 1 applicator by Does not apply route 4 (four) times daily -  before meals and at bedtime.   rosuvastatin 40 MG tablet Commonly known as: CRESTOR Take 40 mg by mouth daily.   TART CHERRY ADVANCED PO Take by mouth.   Turmeric 500 MG Caps Take by mouth.   valsartan-hydrochlorothiazide 320-25 MG  tablet Commonly known as: DIOVAN-HCT Take 1 tablet by mouth daily.   VITAMIN B12-FOLIC ACID PO Take 1 tablet by mouth daily.   vitamin C 100 MG tablet Take 1 tablet by mouth daily.   Vitamin D3 75 MCG (3000 UT) Tabs Take by mouth. 2,000 units daily   Vitamin K2 100 MCG Caps Take 1 tablet by mouth daily.   Zinc 25 MG Tabs Take 1 tablet by mouth daily.       All past medical history, surgical history, allergies, family history, immunizations andmedications were updated in the EMR today and reviewed under the history and medication portions of their EMR.     ROS: Negative, with the exception of above mentioned in HPI   Objective:  BP 135/72   Pulse 74   Temp 98 F (36.7 C) (Oral)   Ht 5\' 10"  (1.778 m)   Wt 276 lb (125.2 kg)   SpO2 99%   BMI 39.60 kg/m  Body mass index is 39.6 kg/m. Gen: Afebrile. No acute distress. Nontoxic in appearance, well developed, well nourished.  Very pleasant obese female HENT: AT. [place].  MSK: No erythema, mild soft tissue swelling medial aspect of left foot.  Tender to palpation over this area.  Significant ankle/foot deformity with flattening of medial arch. Skin: No rashes, purpura or petechiae.  Neuro: Walking with a limp.  PERLA. EOMi. Alert. Oriented x3  Psych: Normal affect, dress and demeanor. Normal speech. Normal thought content and judgment.  No exam data  present No results found. No results found for this or any previous visit (from the past 24 hour(s)).  Assessment/Plan: Baudelia Schroepfer is a 70 y.o. female present for OV for  Foot deformity, acquired, left/Charcot ankle-foot, left/Posterior tibial tendon dysfunction (PTTD) of left lower extremity/diabetes Patient's left ankle and foot appears to be that of worsening charcot foot deformity.  Last x-rays in the system were from 2018.  We will repeat x-rays for her today. Discussed referral to orthopedic foot surgeon and she is agreeable to this approach today. - DG Ankle Complete Left; Future - DG Foot Complete Left; Future Work excuse provided for her today. Follow-up as needed.     Lumbar back pain with radiculopathy affecting lower extremity/Spinal stenosis, lumbar region, without neurogenic claudication Stable. Agreed to take over management of Cymbalta.  Discussed management with her today and it was decided to increase Cymbalta to 40 mg daily and discontinue Lexapro 10 mg daily.  Maximizing Cymbalta for her anxiety and muscle skeletal condition.   Reviewed expectations re: course of current medical issues.  Discussed self-management of symptoms.  Outlined signs and symptoms indicating need for more acute intervention.  Patient verbalized understanding and all questions were answered.  Patient received an After-Visit Summary.    Orders Placed This Encounter  Procedures  . DG Ankle Complete Left  . DG Foot Complete Left  . Ambulatory referral to Orthopedic Surgery   Meds ordered this encounter  Medications  . DULoxetine 40 MG CPEP    Sig: Take 40 mg by mouth daily.    Dispense:  90 capsule    Refill:  1    Referral Orders     Ambulatory referral to Orthopedic Surgery   Note is dictated utilizing voice recognition software. Although note has been proof read prior to signing, occasional typographical errors still can be missed. If any questions arise, please do not  hesitate to call for verification.   electronically signed by:  [name], DO  [facility] -  OR

## 2020-05-27 NOTE — Patient Instructions (Addendum)
I believe you need to see an orthopedic surgeon for your foot.  I believe you are developing Charcot foot.

## 2020-05-30 ENCOUNTER — Ambulatory Visit (HOSPITAL_BASED_OUTPATIENT_CLINIC_OR_DEPARTMENT_OTHER)
Admission: RE | Admit: 2020-05-30 | Discharge: 2020-05-30 | Disposition: A | Discharge status: Home / Self Care | Payer: Medicare Other | Source: Ambulatory Visit | Attending: Family Medicine | Admitting: Family Medicine

## 2020-05-30 ENCOUNTER — Other Ambulatory Visit: Payer: Self-pay

## 2020-05-30 DIAGNOSIS — E1149 Type 2 diabetes mellitus with other diabetic neurological complication: Secondary | ICD-10-CM | POA: Insufficient documentation

## 2020-05-30 DIAGNOSIS — E1140 Type 2 diabetes mellitus with diabetic neuropathy, unspecified: Secondary | ICD-10-CM | POA: Insufficient documentation

## 2020-05-30 DIAGNOSIS — M21962 Unspecified acquired deformity of left lower leg: Secondary | ICD-10-CM | POA: Diagnosis present

## 2020-05-30 DIAGNOSIS — Z794 Long term (current) use of insulin: Secondary | ICD-10-CM | POA: Diagnosis present

## 2020-05-31 ENCOUNTER — Encounter: Payer: Self-pay | Admitting: Family Medicine

## 2020-05-31 ENCOUNTER — Telehealth: Payer: Self-pay

## 2020-05-31 MED ORDER — DULOXETINE HCL 20 MG PO CPEP: 20.0000 mg | ORAL_CAPSULE | Freq: Two times a day (BID) | ORAL | 1 refills | Status: DC

## 2020-05-31 NOTE — Telephone Encounter (Signed)
Patient stated she was just speaking to [place] and requested to speak her again.    [place], please call patient at your convenience. [phone]

## 2020-05-31 NOTE — Telephone Encounter (Signed)
Replace Cymbalta 40 mg with Cymbalta 20 mg twice daily.  That should allow for coverage by insurance.

## 2020-05-31 NOTE — Telephone Encounter (Signed)
Spoke with pt to make aware

## 2020-06-01 NOTE — Telephone Encounter (Signed)
Please see other encounter.

## 2020-06-06 ENCOUNTER — Ambulatory Visit (AMBULATORY_SURGERY_CENTER): Payer: Medicare Other

## 2020-06-06 ENCOUNTER — Other Ambulatory Visit: Payer: Self-pay

## 2020-06-06 VITALS — Ht 71.0 in | Wt 270.0 lb

## 2020-06-06 DIAGNOSIS — Z1211 Encounter for screening for malignant neoplasm of colon: Secondary | ICD-10-CM

## 2020-06-06 MED ORDER — SUTAB 1479-225-188 MG PO TABS: 1.0000 | ORAL_TABLET | ORAL | 0 refills | Status: DC

## 2020-06-06 NOTE — Progress Notes (Signed)
Pre visit completed via phone; patient verified name, DOB, and address; No egg or soy allergy known to patient  No issues with past sedation with any surgeries or procedures No intubation problems in the past  No FH of Malignant Hyperthermia No diet pills per patient No home 02 use per patient  No blood thinners per patient  Pt reports issues with constipation -- advised Miralax at least 2 times per day starting today as her procedure is scheduled for 06/08/2020 and virtual PV on 06/06/2020; No A fib or A flutter  EMMI video via [place] 19 guidelines implemented in PV today with Pt and RN  Pt denies loose or missing teeth, dentures, partials, dental implants, capped or bonded teeth;  Coupon given to pt in PV today, Code to Pharmacy and  NO PA's for preps discussed with pt in PV today  Discussed with pt there will be an out-of-pocket cost for prep and that varies from $0 to 70 dollars  Due to the COVID-19 pandemic we are asking patients to follow certain guidelines. Pt aware of COVID protocols and [place] guidelines   Patient reports she already has the instructions for this prep that were sent to her on 05/27/2020; patient's questions answered at virtual [place];

## 2020-06-07 ENCOUNTER — Ambulatory Visit (INDEPENDENT_AMBULATORY_CARE_PROVIDER_SITE_OTHER): Payer: Medicare Other | Admitting: Orthopedic Surgery

## 2020-06-07 ENCOUNTER — Encounter: Payer: Self-pay | Admitting: Orthopedic Surgery

## 2020-06-07 DIAGNOSIS — M2142 Flat foot [pes planus] (acquired), left foot: Secondary | ICD-10-CM

## 2020-06-07 DIAGNOSIS — M25572 Pain in left ankle and joints of left foot: Secondary | ICD-10-CM

## 2020-06-07 NOTE — Progress Notes (Signed)
Office Visit Note   Patient: Julie Osborne           Date of Birth: [birth year]           MRN: [id number] Visit Date: 06/07/2020              Requested by: Ma Hillock, DO [address] [place],  [place] [address] PCP: Ma Hillock, DO  Chief Complaint  Patient presents with  . Left Foot - Pain      HPI: Patient is a 70 year old woman with diabetic insensate neuropathy hemoglobin A1c 8.1 she has had pes planus in her left foot with progressive clawing of her toes she has arthritis neuropathy and diabetes.  Patient complains of pain primarily great toe MTP joint and across the midfoot.  Assessment & Plan: Visit Diagnoses:  1. Pes planovalgus, acquired, left     Plan: Recommended a stiff soled Trail running sneaker such as [place] to unload the midfoot and unload the metatarsals.  Recommended against a orthotic.  Discussed that patient does not have a Charcot arthropathy but if she develops any acute swelling or redness in her foot to follow-up immediately.  Follow-Up Instructions: Return if symptoms worsen or fail to improve.   Ortho Exam  Patient is alert, oriented, no adenopathy, well-dressed, normal affect, normal respiratory effort. Examination patient has a good pulse she has good dorsiflexion of the ankle her radiographs show pronation and valgus of the forefoot with collapse of the MTP joint with hallux rigidus as well.  She has pes planus with out a rocker-bottom deformity.  Patient is tender with attempted range of motion of the great toe and across the midfoot there are no plantar ulcers there is no redness no cellulitis no Charcot arthropathy.  Imaging: No results found. No images are attached to the encounter.  Labs: Lab Results  Component Value Date   HGBA1C 8.1 (A) 03/30/2020   HGBA1C 8.1 03/30/2020   HGBA1C 8.1 (A) 03/30/2020   HGBA1C 8.1 (A) 03/30/2020   LABORGA NO GROWTH 06/25/2016     Lab Results  Component Value Date   ALBUMIN 4.4 12/24/2019    ALBUMIN 4.4 01/21/2019   ALBUMIN 4.3 02/26/2018    No results found for: MG No results found for: VD25OH  No results found for: PREALBUMIN CBC EXTENDED Latest Ref Rng & Units 12/24/2019 01/21/2019 02/26/2018  WBC 4.0 - 10.5 K/uL 6.3 6.6 6.2  RBC 3.87 - 5.11 Mil/uL 4.26 4.20 4.49  HGB 12.0 - 15.0 g/dL 13.2 12.6 13.5  HCT 36.0 - 46.0 % 38.8 37.3 40.4  PLT 150.0 - 400.0 K/uL 261.0 251.0 266.0  NEUTROABS 1.4 - 7.7 K/uL - 3.1 3.0  LYMPHSABS 0.7 - 4.0 K/uL - 2.8 2.5     There is no height or weight on file to calculate BMI.  Orders:  No orders of the defined types were placed in this encounter.  No orders of the defined types were placed in this encounter.    Procedures: No procedures performed  Clinical Data: No additional findings.  ROS:  All other systems negative, except as noted in the HPI. Review of Systems  Objective: Vital Signs: There were no vitals taken for this visit.  Specialty Comments:  No specialty comments available.  PMFS History: Patient Active Problem List   Diagnosis Date Noted  . Charcot ankle, left 05/27/2020  . Situational anxiety 03/30/2020  . Coronary artery disease involving native coronary artery without angina pectoris 02/11/2019  . CAD S/P  percutaneous coronary angioplasty 12/08/2018  . Abnormal cardiovascular stress test 10/16/2018  . Crescendo angina ([place]) 10/16/2018  . Allergic rhinitis 04/22/2018  . Lumbar back pain with radiculopathy affecting lower extremity 04/17/2018  . Morbid obesity ([employer]) 11/29/2016  . Diabetes type 2, controlled ([place])   . OSA (obstructive sleep apnea)   . Hypertension, benign   . Hyperlipidemia   . Posterior tibial tendon dysfunction (PTTD) of left lower extremity 04/10/2015  . Spinal stenosis, lumbar region, without neurogenic claudication 02/06/2007   Past Medical History:  Diagnosis Date  . Apnea   . Closed fracture of neck of left radius 10/14/2017  . Concussion   . Degenerative arthritis     generalized/back/LEFT foot  . Diabetic neuropathy ([place]) 2017  . Diverticulosis 06/19/2013   sigmoid colon on colonoscopy  . Hyperlipidemia 2000   on meds  . Hypertension, benign 1996   on meds  . Lumbar radiculopathy    Severe L4/L5 radiculopathy by old records.   . Pes planus 12/17/2011  . Tenosynovitis of ankle 2017   post tibial tendon dysfunction stage 5 (PTTD)  . Uncontrolled diabetes mellitus ([place]) 2005   on meds    Family History  Problem Relation Age of Onset  . COPD Mother   . Diabetes Mellitus II Mother   . Heart failure Mother   . Osteoarthritis Father   . Cancer Father        tumor found behind xyphoid process  . Diabetes Sister   . Diabetes Brother   . Dementia Maternal Grandmother   . Heart disease Maternal Grandmother   . Heart disease Maternal Grandfather   . Stroke Maternal Grandfather   . Heart failure Maternal Aunt   . Lung cancer Paternal Grandfather   . Lung cancer Paternal Aunt   . Colon polyps Neg Hx   . Colon cancer Neg Hx   . Esophageal cancer Neg Hx   . Stomach cancer Neg Hx   . Rectal cancer Neg Hx     Past Surgical History:  Procedure Laterality Date  . CORONARY ANGIOPLASTY WITH STENT PLACEMENT  10/17/2018   DES x2/LAD  . EXPLORATORY LAPAROTOMY  1976   endometriosis  . MINOR HEMORRHOIDECTOMY  2006  . OTHER SURGICAL HISTORY Right 1975   venous ligation   Social History   Occupational History  . Occupation: Pharmacist, hospital  . Occupation: retired Marine scientist  Tobacco Use  . Smoking status: Former Smoker    Types: Cigarettes  . Smokeless tobacco: Never Used  Vaping Use  . Vaping Use: Never used  Substance and Sexual Activity  . Alcohol use: No  . Drug use: No  . Sexual activity: Yes    Partners: Male    Birth control/protection: None    Comment: Married

## 2020-06-08 ENCOUNTER — Encounter: Payer: Self-pay | Admitting: Gastroenterology

## 2020-06-08 ENCOUNTER — Ambulatory Visit (AMBULATORY_SURGERY_CENTER): Payer: Medicare Other | Admitting: Gastroenterology

## 2020-06-08 ENCOUNTER — Other Ambulatory Visit: Payer: Self-pay

## 2020-06-08 VITALS — BP 162/87 | HR 60 | Temp 96.2°F | Resp 18 | Ht 70.0 in | Wt 270.0 lb

## 2020-06-08 DIAGNOSIS — D125 Benign neoplasm of sigmoid colon: Secondary | ICD-10-CM | POA: Diagnosis not present

## 2020-06-08 DIAGNOSIS — K640 First degree hemorrhoids: Secondary | ICD-10-CM

## 2020-06-08 DIAGNOSIS — Z1211 Encounter for screening for malignant neoplasm of colon: Secondary | ICD-10-CM

## 2020-06-08 DIAGNOSIS — D124 Benign neoplasm of descending colon: Secondary | ICD-10-CM

## 2020-06-08 DIAGNOSIS — D123 Benign neoplasm of transverse colon: Secondary | ICD-10-CM

## 2020-06-08 MED ORDER — SODIUM CHLORIDE 0.9 % IV SOLN: 500.0000 mL | Freq: Once | INTRAVENOUS | Status: DC

## 2020-06-08 NOTE — Patient Instructions (Signed)
YOU HAD AN ENDOSCOPIC PROCEDURE TODAY AT [facility]:   Refer to the procedure report that was given to you for any specific questions about what was found during the examination.  If the procedure report does not answer your questions, please call your gastroenterologist to clarify.  If you requested that your care partner not be given the details of your procedure findings, then the procedure report has been included in a sealed envelope for you to review at your convenience later.  YOU SHOULD EXPECT: Some feelings of bloating in the abdomen. Passage of more gas than usual.  Walking can help get rid of the air that was put into your GI tract during the procedure and reduce the bloating. If you had a lower endoscopy (such as a colonoscopy or flexible sigmoidoscopy) you may notice spotting of blood in your stool or on the toilet paper. If you underwent a bowel prep for your procedure, you may not have a normal bowel movement for a few days.  Please Note:  You might notice some irritation and congestion in your nose or some drainage.  This is from the oxygen used during your procedure.  There is no need for concern and it should clear up in a day or so.  SYMPTOMS TO REPORT IMMEDIATELY:   Following lower endoscopy (colonoscopy or flexible sigmoidoscopy):  Excessive amounts of blood in the stool  Significant tenderness or worsening of abdominal pains  Swelling of the abdomen that is new, acute  Fever of 100F or higher   For urgent or emergent issues, a gastroenterologist can be reached at any hour by calling [phone]. Do not use MyChart messaging for urgent concerns.    DIET:  We do recommend a small meal at first, but then you may proceed to your regular diet.  Drink plenty of fluids but you should avoid alcoholic beverages for 24 hours.  MEDICATIONS: Continue present medications.  Please see handouts given to you by your recovery nurse.  Follow up with Dr. Bryan Lemma  in his office as needed.  Thank you for allowing [place] to provide for your healthcare needs today.  ACTIVITY:  You should plan to take it easy for the rest of today and you should NOT DRIVE or use heavy machinery until tomorrow (because of the sedation medicines used during the test).    FOLLOW UP: Our staff will call the number listed on your records 48-72 hours following your procedure to check on you and address any questions or concerns that you may have regarding the information given to you following your procedure. If we do not reach you, we will leave a message.  We will attempt to reach you two times.  During this call, we will ask if you have developed any symptoms of COVID 19. If you develop any symptoms (ie: fever, flu-like symptoms, shortness of breath, cough etc.) before then, please call [phone].  If you test positive for Covid 19 in the 2 weeks post procedure, please call and report this information to [place].    If any biopsies were taken you will be contacted by phone or by letter within the next 1-3 weeks.  Please call us at [phone] if you have not heard about the biopsies in 3 weeks.    SIGNATURES/CONFIDENTIALITY: You and/or your care partner have signed paperwork which will be entered into your electronic medical record.  These signatures attest to the fact that that the information above on your After Visit Summary  has been reviewed and is understood.  Full responsibility of the confidentiality of this discharge information lies with you and/or your care-partner. 

## 2020-06-08 NOTE — Progress Notes (Signed)
Pt's states no medical or surgical changes since previsit or office visit.  [place] vitals and JD IV. 

## 2020-06-08 NOTE — Progress Notes (Signed)
Called to room to assist during endoscopic procedure.  Patient ID and intended procedure confirmed with present staff. Received instructions for my participation in the procedure from the performing physician.  

## 2020-06-08 NOTE — Progress Notes (Signed)
Pt started heaving during the procedure while tech was providing abdominal pressure. Pressure was discontinued and a small amount of clear fluid was suctioned orally. Zofran 4mg  given r/t hx of PONV.

## 2020-06-08 NOTE — Progress Notes (Signed)
PT taken to PACU. Monitors in place. VSS. Report given to RN.  Small skin tear noted on left lateral forearm. Wound cleaned and bandage applied. Pt made aware.

## 2020-06-08 NOTE — Op Note (Signed)
[facility] Patient Name: Julie Osborne Procedure Date: 06/08/2020 1:44 PM MRN: [id number] Endoscopist: Gerrit Heck , MD Age: 70 Referring MD:  Date of Birth: March 04, 1951 Gender: Female Account #: [id number] Procedure:                Colonoscopy Indications:              Screening for colorectal malignant neoplasm                           - Last colonoscopy was 06/2013 in [place] and                            notable for fair prep with recommendation to repeat                            in 5 years. She is otherwise without GI symptoms. Medicines:                Monitored Anesthesia Care Procedure:                Pre-Anesthesia Assessment:                           - Prior to the procedure, a History and Physical                            was performed, and patient medications and                            allergies were reviewed. The patient's tolerance of                            previous anesthesia was also reviewed. The risks                            and benefits of the procedure and the sedation                            options and risks were discussed with the patient.                            All questions were answered, and informed consent                            was obtained. Prior Anticoagulants: The patient has                            taken no previous anticoagulant or antiplatelet                            agents. ASA Grade Assessment: III - A patient with                            severe systemic disease. After reviewing the risks  and benefits, the patient was deemed in                            satisfactory condition to undergo the procedure.                           After obtaining informed consent, the colonoscope                            was passed under direct vision. Throughout the                            procedure, the patient's blood pressure, pulse, and                            oxygen saturations  were monitored continuously. The                            Olympus [id number]) [id number] was introduced                            through the anus and advanced to the the cecum,                            identified by appendiceal orifice and ileocecal                            valve. The colonoscopy was technically difficult                            and complex due to a redundant colon and                            significant looping. Successful completion of the                            procedure was aided by changing the patient to a                            supine position and using manual pressure. The                            patient tolerated the procedure fairly well. The                            quality of the bowel preparation was adequate                            following significant irrigation and lavage. The                            ileocecal valve, appendiceal orifice, and rectum  were photographed. Scope In: 2:16:08 PM Scope Out: 3:10:09 PM Scope Withdrawal Time: 0 hours 35 minutes 10 seconds  Total Procedure Duration: 0 hours 54 minutes 1 second  Findings:                 The perianal and digital rectal examinations were                            normal.                           Six sessile polyps were found in the sigmoid colon                            (3), descending colon (1), and transverse colon                            (2). The polyps were 3 to 5 mm in size. These                            polyps were removed with a cold snare. Resection                            and retrieval were complete. Estimated blood loss                            was minimal.                           Non-bleeding internal hemorrhoids were found during                            retroflexion. The hemorrhoids were small.                           The ascending colon revealed significantly                            excessive  looping. Advancing the scope required                            changing the patient to a supine position and using                            manual pressure.                           A moderate amount of stool was found in the sigmoid                            colon, in the ascending colon and in the cecum.                            Lavage of the area was performed using copious  amounts of tap water, resulting in clearance with                            adequate visualization. Complications:            No immediate complications. Estimated Blood Loss:     Estimated blood loss was minimal. Impression:               - Six 3 to 5 mm polyps in the sigmoid colon, in the                            descending colon and in the transverse colon,                            removed with a cold snare. Resected and retrieved.                           - Non-bleeding internal hemorrhoids.                           - There was significant looping of the colon.                           - Stool in the sigmoid colon, in the ascending                            colon and in the cecum. Recommendation:           - Patient has a contact number available for                            emergencies. The signs and symptoms of potential                            delayed complications were discussed with the                            patient. Return to normal activities tomorrow.                            Written discharge instructions were provided to the                            patient.                           - Resume previous diet.                           - Continue present medications.                           - Await pathology results.                           - Repeat colonoscopy for surveillance based on  pathology results.                           - Recommend abdominal binder and extended 2 day                            prep when  scheduling future colonoscopy.                           - Return to GI office PRN. Gerrit Heck, MD 06/08/2020 3:21:31 PM

## 2020-06-09 ENCOUNTER — Ambulatory Visit (INDEPENDENT_AMBULATORY_CARE_PROVIDER_SITE_OTHER): Payer: Medicare Other | Admitting: Psychology

## 2020-06-09 DIAGNOSIS — F4323 Adjustment disorder with mixed anxiety and depressed mood: Secondary | ICD-10-CM | POA: Diagnosis not present

## 2020-06-10 ENCOUNTER — Ambulatory Visit: Payer: Medicare Other | Admitting: Family Medicine

## 2020-06-10 ENCOUNTER — Telehealth: Payer: Self-pay

## 2020-06-10 NOTE — Telephone Encounter (Signed)
LVM

## 2020-06-17 ENCOUNTER — Ambulatory Visit: Payer: Medicare Other | Admitting: Family Medicine

## 2020-06-22 ENCOUNTER — Ambulatory Visit: Payer: Medicare Other | Admitting: Family Medicine

## 2020-06-22 ENCOUNTER — Encounter: Payer: Self-pay | Admitting: Gastroenterology

## 2020-06-23 ENCOUNTER — Ambulatory Visit: Payer: Medicare Other | Admitting: Psychology

## 2020-07-07 ENCOUNTER — Ambulatory Visit: Payer: Medicare Other | Admitting: Family Medicine

## 2020-07-14 ENCOUNTER — Ambulatory Visit: Payer: Medicare Other | Admitting: Psychology

## 2020-07-15 ENCOUNTER — Ambulatory Visit: Payer: Medicare Other | Admitting: Family Medicine

## 2020-08-05 ENCOUNTER — Ambulatory Visit: Payer: Medicare Other | Admitting: Family Medicine

## 2020-08-05 DIAGNOSIS — F418 Other specified anxiety disorders: Secondary | ICD-10-CM

## 2020-08-05 DIAGNOSIS — I2510 Atherosclerotic heart disease of native coronary artery without angina pectoris: Secondary | ICD-10-CM

## 2020-08-05 DIAGNOSIS — I10 Essential (primary) hypertension: Secondary | ICD-10-CM

## 2020-08-05 DIAGNOSIS — E1140 Type 2 diabetes mellitus with diabetic neuropathy, unspecified: Secondary | ICD-10-CM

## 2020-08-05 DIAGNOSIS — E785 Hyperlipidemia, unspecified: Secondary | ICD-10-CM

## 2020-08-16 ENCOUNTER — Other Ambulatory Visit: Payer: Self-pay

## 2020-08-16 ENCOUNTER — Ambulatory Visit (INDEPENDENT_AMBULATORY_CARE_PROVIDER_SITE_OTHER): Payer: Medicare Other | Admitting: Family Medicine

## 2020-08-16 ENCOUNTER — Encounter: Payer: Self-pay | Admitting: Family Medicine

## 2020-08-16 ENCOUNTER — Telehealth: Payer: Self-pay | Admitting: Family Medicine

## 2020-08-16 VITALS — BP 150/74 | HR 57 | Temp 98.1°F | Ht 70.0 in | Wt 277.0 lb

## 2020-08-16 DIAGNOSIS — I10 Essential (primary) hypertension: Secondary | ICD-10-CM | POA: Diagnosis not present

## 2020-08-16 DIAGNOSIS — E785 Hyperlipidemia, unspecified: Secondary | ICD-10-CM | POA: Diagnosis not present

## 2020-08-16 DIAGNOSIS — Z9861 Coronary angioplasty status: Secondary | ICD-10-CM

## 2020-08-16 DIAGNOSIS — G4733 Obstructive sleep apnea (adult) (pediatric): Secondary | ICD-10-CM

## 2020-08-16 DIAGNOSIS — I2510 Atherosclerotic heart disease of native coronary artery without angina pectoris: Secondary | ICD-10-CM

## 2020-08-16 DIAGNOSIS — M5416 Radiculopathy, lumbar region: Secondary | ICD-10-CM

## 2020-08-16 DIAGNOSIS — E1149 Type 2 diabetes mellitus with other diabetic neurological complication: Secondary | ICD-10-CM

## 2020-08-16 DIAGNOSIS — Z1231 Encounter for screening mammogram for malignant neoplasm of breast: Secondary | ICD-10-CM

## 2020-08-16 DIAGNOSIS — Z794 Long term (current) use of insulin: Secondary | ICD-10-CM

## 2020-08-16 DIAGNOSIS — E871 Hypo-osmolality and hyponatremia: Secondary | ICD-10-CM

## 2020-08-16 DIAGNOSIS — E1140 Type 2 diabetes mellitus with diabetic neuropathy, unspecified: Secondary | ICD-10-CM | POA: Diagnosis not present

## 2020-08-16 DIAGNOSIS — M48061 Spinal stenosis, lumbar region without neurogenic claudication: Secondary | ICD-10-CM

## 2020-08-16 DIAGNOSIS — F418 Other specified anxiety disorders: Secondary | ICD-10-CM

## 2020-08-16 LAB — BASIC METABOLIC PANEL
BUN: 13 mg/dL (ref 6–23)
CO2: 32 mEq/L (ref 19–32)
Calcium: 9 mg/dL (ref 8.4–10.5)
Chloride: 93 mEq/L — ABNORMAL LOW (ref 96–112)
Creatinine, Ser: 0.62 mg/dL (ref 0.40–1.20)
GFR: 91.95 mL/min (ref >60.00)
Glucose, Bld: 180 mg/dL — ABNORMAL HIGH (ref 70–99)
Potassium: 3.9 mEq/L (ref 3.5–5.1)
Sodium: 131 mEq/L — ABNORMAL LOW (ref 135–145)

## 2020-08-16 LAB — POCT GLYCOSYLATED HEMOGLOBIN (HGB A1C)
HbA1c POC (<> result, manual entry): 9.6 % (ref 4.0–5.6)
HbA1c, POC (controlled diabetic range): 9.6 % — AB (ref 0.0–7.0)
HbA1c, POC (prediabetic range): 9.6 % — AB (ref 5.7–6.4)
Hemoglobin A1C: 9.6 % — AB (ref 4.0–5.6)

## 2020-08-16 MED ORDER — METOPROLOL SUCCINATE ER 50 MG PO TB24: 50.0000 mg | ORAL_TABLET | Freq: Every day | ORAL | 1 refills | Status: DC

## 2020-08-16 MED ORDER — GLIPIZIDE 10 MG PO TABS: 10.0000 mg | ORAL_TABLET | Freq: Two times a day (BID) | ORAL | 1 refills | Status: DC

## 2020-08-16 MED ORDER — VALSARTAN-HYDROCHLOROTHIAZIDE 320-25 MG PO TABS: 1.0000 | ORAL_TABLET | Freq: Every day | ORAL | 1 refills | Status: DC

## 2020-08-16 MED ORDER — RYBELSUS 7 MG PO TABS: 7.0000 mg | ORAL_TABLET | Freq: Every day | ORAL | 1 refills | Status: DC

## 2020-08-16 MED ORDER — AMLODIPINE BESYLATE 10 MG PO TABS: 10.0000 mg | ORAL_TABLET | Freq: Every day | ORAL | 1 refills | Status: DC

## 2020-08-16 MED ORDER — INSULIN DETEMIR 100 UNIT/ML FLEXPEN: 60.0000 [IU] | PEN_INJECTOR | Freq: Every day | SUBCUTANEOUS | 5 refills | Status: DC

## 2020-08-16 MED ORDER — DULOXETINE HCL 20 MG PO CPEP: 20.0000 mg | ORAL_CAPSULE | Freq: Two times a day (BID) | ORAL | 1 refills | Status: DC

## 2020-08-16 MED ORDER — RYBELSUS 3 MG PO TABS: 3.0000 mg | ORAL_TABLET | Freq: Every day | ORAL | 0 refills | Status: DC

## 2020-08-16 MED ORDER — METFORMIN HCL 1000 MG PO TABS: 1000.0000 mg | ORAL_TABLET | Freq: Two times a day (BID) | ORAL | 1 refills | Status: DC

## 2020-08-16 NOTE — Patient Instructions (Addendum)
Nurse visit in 1-2 weeks for BP recheck.  Start taking amlodipine at night before bed.   Meds the same except:   - added rybelsus- starts at 3 mg for 1 month- then new pill will be 7 mg.    - insulin - taper up every 3 days -  2 units - until fasting glucose is 130.    Provider appt in 3 mos.

## 2020-08-16 NOTE — Telephone Encounter (Signed)
Please inform patient her kidney function is normal. She does have mildly lower sodium than she normally has at 131.  This can create mild weakness, fatigue or muscle cramps potentially.  This is just mildly low in she may see none of these symptoms.  However I would encourage her to add a sugar-free electrolyte replacement such as Gatorade 0 to her regimen at least 1 bottle daily to keep sodium in normal range so she does not develop symptoms.

## 2020-08-16 NOTE — Progress Notes (Signed)
This visit occurred during the SARS-CoV-2 public health emergency.  Safety protocols were in place, including screening questions prior to the visit, additional usage of staff PPE, and extensive cleaning of exam room while observing appropriate contact time as indicated for disinfecting solutions.    Julie Osborne , 1951-04-06, 70 y.o., female MRN: [id number] Patient Care Team    Relationship Specialty Notifications Start End  Ma Hillock, DO PCP - General Family Medicine  01/13/16   Leda Roys, [place] Referring Physician Podiatry  12/24/19   Newt Minion, MD Consulting Physician Orthopedic Surgery  08/16/20     Chief Complaint  Patient presents with  . Follow-up    [facility]; pt is fasting     Subjective:Julie Osborne is a 70 y.o. female patient present for Diabetes/morbid obesity:  Pt reportscompliance with metformin 1000 mg BID, glipizide 10 mg BID,  and Levemir  60 units nightly (did not increase to 70 as discussed last visit).Patient denies dizziness, hyperglycemic or hypoglycemic events. Patient denies dizziness, hyperglycemic or hypoglycemic events. Patient denies numbness, tingling in the extremities or nonhealing wounds of feet.   She does have neuropathy of toes and hands on occasions. She is compliant with  gabapentin 300/400. She has follow-up with Dr. Sharol Given and podiatry for care of her feet. Onset: 2005 PNA series:completed Flu shot: UTD 2021 today (recommneded yearly) Foot exam: Completed.  Following with podiatry and [facility] on [place]. Dr. Dennie Fetters 12/29/2019 A1c: 7.9--> 7.5 --> 6.2 --> 7.6--> 6.4--> 6.8 >> 7.6>>6.8>>7.9>8.2> 8.1>9.6 Microallb: On ARB  HTN/hyperlipidemia: Pt reportscompliance  with amlodipine 10, valsartan-hctz 10-320-25, metoprolol 50 mg daily, Crestor and ASA 81 today. Patient denies chest pain, shortness of breath, dizziness or lower extremity edema.  She reports she took her medicine just about 45 minutes  ago.  Anxiety: Patient reports she feels the Cymbalta 20 mg twice daily is working very well for her for both her anxiety and her muscle skeletal complaints. Prior note: Pt reports since her MIL moved in with them a few weeks ago she finds herself not able to control her emotions. She is having panic attacks in the middle of the night. She feels anxious and has not been able to find a way to cope with the changes. She states her MIL has some cognitive decline and is unable to live on her own. [name] reports she has always had difficulty getting along with her MIL, but it is more difficult now since she is living with her.     Depression screen [facility] 2/9 08/16/2020 03/30/2020 02/24/2020 02/26/2018 08/16/2017  Decreased Interest 0 0 0 0 0  Down, Depressed, Hopeless 1 1 0 0 0  PHQ - 2 Score 1 1 0 0 0  Altered sleeping 0 3 - - -  Tired, decreased energy 1 3 - - -  Change in appetite 1 3 - - -  Feeling bad or failure about yourself  0 3 - - -  Trouble concentrating 0 3 - - -  Moving slowly or fidgety/restless 0 0 - - -  Suicidal thoughts 0 0 - - -  PHQ-9 Score 3 16 - - -    Allergies  Allergen Reactions  . Statins     Lipitor,simvatatin, pravastatin.welchol cause myalgia   Social History   Social History Narrative   Married to [employer], 2 children.    BS, RN retired now Printmaker.    Drinks caffeine, uses herbal remedies. Daily vitmain use.   Wears  her seatbelt, smoke detector in the home.    exercises routinely.    She feels safe in her relationships.    Past Medical History:  Diagnosis Date  . Apnea   . Closed fracture of neck of left radius 10/14/2017  . Concussion   . Degenerative arthritis    generalized/back/LEFT foot  . Diabetic neuropathy ([place]) 2017  . Diverticulosis 06/19/2013   sigmoid colon on colonoscopy  . Hyperlipidemia 2000   on meds  . Hypertension, benign 1996   on meds  . Lumbar radiculopathy    Severe L4/L5 radiculopathy by old records.   . Pes planus 12/17/2011   . Tenosynovitis of ankle 2017   post tibial tendon dysfunction stage 5 (PTTD)  . Uncontrolled diabetes mellitus ([place]) 2005   on meds   Past Surgical History:  Procedure Laterality Date  . CORONARY ANGIOPLASTY WITH STENT PLACEMENT  10/17/2018   DES x2/LAD  . EXPLORATORY LAPAROTOMY  1976   endometriosis  . MINOR HEMORRHOIDECTOMY  2006  . OTHER SURGICAL HISTORY Right 1975   venous ligation   Family History  Problem Relation Age of Onset  . COPD Mother   . Diabetes Mellitus II Mother   . Heart failure Mother   . Osteoarthritis Father   . Cancer Father        tumor found behind xyphoid process  . Diabetes Sister   . Diabetes Brother   . Dementia Maternal Grandmother   . Heart disease Maternal Grandmother   . Heart disease Maternal Grandfather   . Stroke Maternal Grandfather   . Heart failure Maternal Aunt   . Lung cancer Paternal Grandfather   . Lung cancer Paternal Aunt   . Colon polyps Neg Hx   . Colon cancer Neg Hx   . Esophageal cancer Neg Hx   . Stomach cancer Neg Hx   . Rectal cancer Neg Hx    Allergies as of 08/16/2020      Reactions   Statins    Lipitor,simvatatin, pravastatin.welchol cause myalgia      Medication List       Accurate as of Aug 16, 2020  5:12 PM. If you have any questions, ask your nurse or doctor.        STOP taking these medications   glucose blood test strip Commonly known as: Civil engineer, contracting Stopped by: [name], DO   OneTouch Delica Lancets 81E Misc Stopped by: [name], DO     TAKE these medications   albuterol 108 (90 Base) MCG/ACT inhaler Commonly known as: VENTOLIN HFA Inhale 1-2 puffs into the lungs every 6 (six) hours as needed for wheezing or shortness of breath.   amLODipine 10 MG tablet Commonly known as: NORVASC Take 1 tablet (10 mg total) by mouth daily.   aspirin 81 MG chewable tablet Chew by mouth daily.   calcium citrate-vitamin D 315-200 MG-UNIT tablet Commonly known as: CITRACAL+D Take 1 tablet  by mouth daily.   CoQ10 100 MG Caps Take 1 tablet by mouth daily.   diclofenac Sodium 1 % Gel Commonly known as: Voltaren Apply topically 4 times daily PRN   DULoxetine 20 MG capsule Commonly known as: CYMBALTA Take 1 capsule (20 mg total) by mouth 2 (two) times daily. What changed: Another medication with the same name was removed. Continue taking this medication, and follow the directions you see here. Changed by: [name], DO   FIBER-CAPS PO Take by mouth.   fluticasone 50 MCG/ACT nasal spray Commonly known as: FLONASE  Place 2 sprays into both nostrils daily.   glipiZIDE 10 MG tablet Commonly known as: GLUCOTROL Take 1 tablet (10 mg total) by mouth 2 (two) times daily before a meal.   insulin detemir 100 UNIT/ML FlexPen Commonly known as: LEVEMIR Inject 60-70 Units into the skin at bedtime. What changed: how much to take Changed by: [name], DO   Insulin Pen Needle 31G X 5 MM Misc Use as directed   Magnesium 400 MG Caps Take 1 tablet by mouth daily.   metFORMIN 1000 MG tablet Commonly known as: GLUCOPHAGE Take 1 tablet (1,000 mg total) by mouth 2 (two) times daily with a meal.   metoprolol succinate 50 MG 24 hr tablet Commonly known as: TOPROL-XL Take 1 tablet (50 mg total) by mouth daily.   multivitamin capsule Take 1 capsule by mouth daily.   nitroGLYCERIN 0.4 MG SL tablet Commonly known as: NITROSTAT Place under the tongue.   rosuvastatin 20 MG tablet Commonly known as: CRESTOR Take 20 mg by mouth daily. What changed: Another medication with the same name was removed. Continue taking this medication, and follow the directions you see here. Changed by: [name], DO   Rybelsus 3 MG Tabs Generic drug: Semaglutide Take 3 mg by mouth daily. Started by: [name], DO   Rybelsus 7 MG Tabs Generic drug: Semaglutide Take 7 mg by mouth daily. Started by: [name], DO   TART CHERRY ADVANCED PO Take by mouth.   Turmeric 500 MG  Caps Take by mouth.   valsartan-hydrochlorothiazide 320-25 MG tablet Commonly known as: DIOVAN-HCT Take 1 tablet by mouth daily.   VITAMIN B12-FOLIC ACID PO Take 1 tablet by mouth daily.   vitamin C 100 MG tablet Take 1 tablet by mouth daily.   Vitamin D3 75 MCG (3000 UT) Tabs Take by mouth. 2,000 units daily   Vitamin K2 100 MCG Caps Take 1 tablet by mouth daily.   Zinc 25 MG Tabs Take 1 tablet by mouth daily.       All past medical history, surgical history, allergies, family history, immunizations andmedications were updated in the EMR today and reviewed under the history and medication portions of their EMR.     ROS: Negative, with the exception of above mentioned in HPI   Objective:  BP (!) 150/74   Pulse (!) 57   Temp 98.1 F (36.7 C) (Oral)   Ht 5\' 10"  (1.778 m)   Wt 277 lb (125.6 kg)   SpO2 100%   BMI 39.75 kg/m  Body mass index is 39.75 kg/m. Gen: Afebrile. No acute distress. Obese female.  HENT: AT. [place].  Eyes:Pupils Equal Round Reactive to light, Extraocular movements intact,  Conjunctiva without redness, discharge or icterus. CV: RRR no murmur,  No edema, +2/4 P posterior tibialis pulses Chest: CTAB, no wheeze or crackles Abd: Soft. NTND. BS present Neuro: Normal gait. PERLA. EOMi. Alert. Oriented x3. Psych: Normal affect, dress and demeanor. Normal speech. Normal thought content and judgment.    No exam data present No results found. Results for orders placed or performed in visit on 08/16/20 (from the past 24 hour(s))  POCT HgB A1C     Status: Abnormal   Collection Time: 08/16/20  8:44 AM  Result Value Ref Range   Hemoglobin A1C 9.6 (A) 4.0 - 5.6 %   HbA1c POC (<> result, manual entry) 9.6 4.0 - 5.6 %   HbA1c, POC (prediabetic range) 9.6 (A) 5.7 - 6.4 %   HbA1c, POC (controlled diabetic  range) 9.6 (A) 0.0 - 7.0 %  Basic Metabolic Panel (BMET)     Status: Abnormal   Collection Time: 08/16/20  9:14 AM  Result Value Ref Range   Sodium 131 (L)  135 - 145 mEq/L   Potassium 3.9 3.5 - 5.1 mEq/L   Chloride 93 (L) 96 - 112 mEq/L   CO2 32 19 - 32 mEq/L   Glucose, Bld 180 (H) 70 - 99 mg/dL   BUN 13 6 - 23 mg/dL   Creatinine, Ser 0.62 0.40 - 1.20 mg/dL   GFR 91.95 >60.00 mL/min   Calcium 9.0 8.4 - 10.5 mg/dL    Assessment/Plan: Lekia Nier is a 70 y.o. female present for OV for  Hypertension, benign/HLD/coronary artery disease -Last few blood pressures have been elevated above goal of less than 130/80.  She reports she had just taken her meds.  Therefore she will return in 2 weeks for nurse visit for blood pressure recheck and ensure her medications are in her system at least 2 hours at that time. -Continue amlodipine 10 mg daily> advised her to start taking at night -Continue valsartan/HCTZ 320-12.5.   -Continue metoprolol to 50 mg  - low sodium, exercise. - routine exercise encouraged.  - f/u 3 mos [facility] (1-2 weeks nurse visit for BP recheck)  Morbid obesity (HCC)/Other diabetic neurological complication associated with type 2 diabetes mellitus ([place]) Continues to elevate now uncontrolled.  She did not increase her insulin as discussed last visit.  Discussed different meds to try to add on to her current regimen today, if her A1c is not below 8 on the next visit will refer to endocrinology.  Patient understands. Continue metformin 1000 mg twice daily Continue glipizide  10 mg twice daily Increase  Levemir to  70 units nightly- and add 2 units every 3 days until FBG < 130. Start Rybelsus 3 mg daily and taper to Rybelsus 7 mg daily by next appointment.  She was provided with instructions. Continue  gabapentin(see below for dosage) PNA series:completed Flu shot: UTD 2021 today (recommneded yearly) Foot exam: Completed.  Also follows with orthopedics and podiatry.  Eye exam: 2021- [facility] on [place]. Dr. Dennie Fetters A1c: 7.9--> 7.5 --> 6.2 --> 7.6--> 6.4--> 6.8 >> 7.6>>6.8>>7.9>8.2> 8.1> 9.6 Microallb: On ARB  Lumbar back pain  with radiculopathy affecting lower extremity/Spinal stenosis, lumbar region, without neurogenic claudication/anxiety Stable. Continue Cymbalta to 40 mg daily - Ambulatory referral to Psychology placed 03/2020   Reviewed expectations re: course of current medical issues.  Discussed self-management of symptoms.  Outlined signs and symptoms indicating need for more acute intervention.  Patient verbalized understanding and all questions were answered.  Patient received an After-Visit Summary.    Orders Placed This Encounter  Procedures  . MM 3D SCREEN BREAST BILATERAL  . Basic Metabolic Panel (BMET)  . POCT HgB A1C   Meds ordered this encounter  Medications  . valsartan-hydrochlorothiazide (DIOVAN-HCT) 320-25 MG tablet    Sig: Take 1 tablet by mouth daily.    Dispense:  90 tablet    Refill:  1  . metoprolol succinate (TOPROL-XL) 50 MG 24 hr tablet    Sig: Take 1 tablet (50 mg total) by mouth daily.    Dispense:  90 tablet    Refill:  1  . metFORMIN (GLUCOPHAGE) 1000 MG tablet    Sig: Take 1 tablet (1,000 mg total) by mouth 2 (two) times daily with a meal.    Dispense:  180 tablet    Refill:  1  .  glipiZIDE (GLUCOTROL) 10 MG tablet    Sig: Take 1 tablet (10 mg total) by mouth 2 (two) times daily before a meal.    Dispense:  180 tablet    Refill:  1  . DULoxetine (CYMBALTA) 20 MG capsule    Sig: Take 1 capsule (20 mg total) by mouth 2 (two) times daily.    Dispense:  180 capsule    Refill:  1  . amLODipine (NORVASC) 10 MG tablet    Sig: Take 1 tablet (10 mg total) by mouth daily.    Dispense:  90 tablet    Refill:  1    Please discontinue amlodipine/valsartan/HCTZ combo pill  . Semaglutide (RYBELSUS) 3 MG TABS    Sig: Take 3 mg by mouth daily.    Dispense:  30 tablet    Refill:  0    Script 1  . Semaglutide (RYBELSUS) 7 MG TABS    Sig: Take 7 mg by mouth daily.    Dispense:  90 tablet    Refill:  1    Script #2 may fill 09/07/2020  . insulin detemir (LEVEMIR) 100  UNIT/ML FlexPen    Sig: Inject 60-70 Units into the skin at bedtime.    Dispense:  60 mL    Refill:  5   Referral Orders  No referral(s) requested today     Note is dictated utilizing voice recognition software. Although note has been proof read prior to signing, occasional typographical errors still can be missed. If any questions arise, please do not hesitate to call for verification.   electronically signed by:  [name], DO  [place]

## 2020-08-17 ENCOUNTER — Other Ambulatory Visit: Payer: Self-pay

## 2020-08-17 MED ORDER — RYBELSUS 3 MG PO TABS: 3.0000 mg | ORAL_TABLET | Freq: Every day | ORAL | 0 refills | Status: DC

## 2020-08-17 NOTE — Telephone Encounter (Signed)
LVM for pt to CB regarding results.  

## 2020-08-18 NOTE — Telephone Encounter (Signed)
Spoke with pt regarding labs and instructions.   

## 2020-08-19 ENCOUNTER — Other Ambulatory Visit: Payer: Self-pay

## 2020-08-19 MED ORDER — RYBELSUS 3 MG PO TABS: 3.0000 mg | ORAL_TABLET | Freq: Every day | ORAL | 0 refills | Status: AC

## 2020-09-01 ENCOUNTER — Other Ambulatory Visit: Payer: Self-pay

## 2020-09-01 ENCOUNTER — Ambulatory Visit (INDEPENDENT_AMBULATORY_CARE_PROVIDER_SITE_OTHER): Payer: Medicare Other

## 2020-09-01 VITALS — BP 145/81 | HR 68

## 2020-09-01 DIAGNOSIS — I10 Essential (primary) hypertension: Secondary | ICD-10-CM

## 2020-09-01 NOTE — Progress Notes (Addendum)
Julie Osborne is a 70 y.o. female presents to the office today for Blood pressure recheck secondary to elevated BP in office, and regimen change].  Blood pressure medication:   amlodipine 10 mg daily valsartan/HCTZ 320-12.5.  metoprolol to 50 mg  If on medication, Last dose was at least 1-2 hours prior to recheck: Yes; Pt takes on medication 9 am and 9 pm Blood pressure was taken in the right arm after patient rested for 15 minutes.  BP (!) 149/77   Pulse 68     BP was taken twice second reading was 145/81. Pt admitted to using OTC cough medicine with sudafed.     Kavin Leech  ------------------------------------------------------------------------------------------------ Please advise patient not to take any decongestions or Sudafed with her history of hypertension.  Please set her up for an additional nurse visit in 2 weeks for retesting again.  Please advise her to make sure all medications are in her system within 1 to 2 hours before her appointment.  Avoid any decongestants, stimulant use or Sudafed.  We need to get a valid blood pressure reading for her so that we can appropriately treat her blood pressure.

## 2020-09-01 NOTE — Progress Notes (Signed)
Spoke with pt with provider's instructions.   ?

## 2020-09-15 ENCOUNTER — Other Ambulatory Visit: Payer: Self-pay

## 2020-09-15 ENCOUNTER — Ambulatory Visit (INDEPENDENT_AMBULATORY_CARE_PROVIDER_SITE_OTHER): Payer: Medicare Other

## 2020-09-15 VITALS — BP 148/80 | HR 106

## 2020-09-15 DIAGNOSIS — I10 Essential (primary) hypertension: Secondary | ICD-10-CM | POA: Diagnosis not present

## 2020-09-15 MED ORDER — METOPROLOL SUCCINATE ER 100 MG PO TB24: 100.0000 mg | ORAL_TABLET | Freq: Every day | ORAL | 1 refills | Status: DC

## 2020-09-15 NOTE — Progress Notes (Addendum)
Julie Osborne is a 70 y.o. female presents to the office today for Blood pressure recheck secondary to elevated BP in office, med start, regimen change.  Blood pressure medication: amlodipine 10 mg daily valsartan/HCTZ 320-12.5.   metoprolol to 50 mg  If on medication, Last dose was at least 1-2 hours prior to recheck: Yes Blood pressure was taken in the left arm after patient rested for 20 minutes.    Julie Osborne  --------------------------------------------------------------------------------------------------- BP (!) 148/80   Pulse (!) 106    Please inform patient of the following: Continue amlodipine 10 mg Continue valsartan-HCTZ at current dose. I have increased the dose of her metoprolol to a new dose of metoprolol 100 mg daily.  I have called in a new prescription for her.   -Until she picks up her new prescription at the higher dose per pill, she can finish out her old prescription by taking 2 tabs (at the same time) metoprolol 50 mg daily.

## 2020-09-15 NOTE — Progress Notes (Signed)
LVM for pt to CB regarding OV

## 2020-09-15 NOTE — Addendum Note (Signed)
Addended by: [name] A on: 09/15/2020 12:22 PM   Modules accepted: Orders

## 2020-09-15 NOTE — Progress Notes (Signed)
Spoke with patient regarding results/recommendations.  

## 2020-10-20 ENCOUNTER — Ambulatory Visit: Payer: Medicare Other | Admitting: Family Medicine

## 2020-11-16 ENCOUNTER — Ambulatory Visit: Payer: Medicare Other | Admitting: Family Medicine

## 2020-11-18 ENCOUNTER — Ambulatory Visit: Payer: Medicare Other | Admitting: Family Medicine

## 2020-11-19 LAB — HM DIABETES EYE EXAM

## 2020-12-05 ENCOUNTER — Ambulatory Visit: Payer: Medicare Other | Admitting: Family Medicine

## 2020-12-08 ENCOUNTER — Ambulatory Visit (INDEPENDENT_AMBULATORY_CARE_PROVIDER_SITE_OTHER): Payer: Medicare Other | Admitting: Podiatry

## 2020-12-08 ENCOUNTER — Ambulatory Visit (INDEPENDENT_AMBULATORY_CARE_PROVIDER_SITE_OTHER): Payer: Medicare Other

## 2020-12-08 ENCOUNTER — Other Ambulatory Visit: Payer: Self-pay

## 2020-12-08 DIAGNOSIS — M19072 Primary osteoarthritis, left ankle and foot: Secondary | ICD-10-CM

## 2020-12-08 DIAGNOSIS — Z9861 Coronary angioplasty status: Secondary | ICD-10-CM

## 2020-12-08 DIAGNOSIS — I2510 Atherosclerotic heart disease of native coronary artery without angina pectoris: Secondary | ICD-10-CM | POA: Diagnosis not present

## 2020-12-08 DIAGNOSIS — S99929A Unspecified injury of unspecified foot, initial encounter: Secondary | ICD-10-CM | POA: Diagnosis not present

## 2020-12-08 DIAGNOSIS — S90129A Contusion of unspecified lesser toe(s) without damage to nail, initial encounter: Secondary | ICD-10-CM

## 2020-12-08 DIAGNOSIS — E1149 Type 2 diabetes mellitus with other diabetic neurological complication: Secondary | ICD-10-CM | POA: Diagnosis not present

## 2020-12-08 DIAGNOSIS — M76829 Posterior tibial tendinitis, unspecified leg: Secondary | ICD-10-CM

## 2020-12-08 NOTE — Progress Notes (Signed)
Subjective:   Patient ID: Julie Osborne, female   DOB: 70 y.o.   MRN: [id number]   HPI 70 year old female presents the office today for concerns of possible left big toe injury.  She states on Monday she noticed the toe was bruised.  Yesterday she tried to ice it and elevate it is gotten better since doing that.  She has neuropathy and she may have injured it without knowing.  No other treatment.  No open sores.  She has no other concerns.  Last A1c was 9.6 on 08/16/2020  ROS  Past Medical History:  Diagnosis Date   Apnea    Closed fracture of neck of left radius 10/14/2017   Concussion    Degenerative arthritis    generalized/back/LEFT foot   Diabetic neuropathy ([place]) 2017   Diverticulosis 06/19/2013   sigmoid colon on colonoscopy   Hyperlipidemia 2000   on meds   Hypertension, benign 1996   on meds   Lumbar radiculopathy    Severe L4/L5 radiculopathy by old records.    Pes planus 12/17/2011   Tenosynovitis of ankle 2017   post tibial tendon dysfunction stage 5 (PTTD)   Uncontrolled diabetes mellitus ([place]) 2005   on meds    Past Surgical History:  Procedure Laterality Date   CORONARY ANGIOPLASTY WITH STENT PLACEMENT  10/17/2018   DES x2/LAD   EXPLORATORY LAPAROTOMY  1976   endometriosis   MINOR HEMORRHOIDECTOMY  2006   OTHER SURGICAL HISTORY Right 1975   venous ligation     Current Outpatient Medications:    acetaminophen-codeine (TYLENOL #3) 300-30 MG tablet, Take 1-2 tablets by mouth every 6 (six) hours as needed., Disp: , Rfl:    albuterol (VENTOLIN HFA) 108 (90 Base) MCG/ACT inhaler, Inhale 1-2 puffs into the lungs every 6 (six) hours as needed for wheezing or shortness of breath., Disp: 8 g, Rfl: 11   amLODipine (NORVASC) 10 MG tablet, Take 1 tablet (10 mg total) by mouth daily., Disp: 90 tablet, Rfl: 1   amoxicillin (AMOXIL) 500 MG tablet, Take 500 mg by mouth 3 (three) times daily., Disp: , Rfl:    Ascorbic Acid (VITAMIN C) 100 MG tablet, Take 1 tablet by mouth  daily. , Disp: , Rfl:    aspirin 81 MG chewable tablet, Chew by mouth daily. , Disp: , Rfl:    calcium citrate-vitamin D (CITRACAL+D) 315-200 MG-UNIT tablet, Take 1 tablet by mouth daily. , Disp: , Rfl:    Calcium Polycarbophil (FIBER-CAPS PO), Take by mouth. , Disp: , Rfl:    Cholecalciferol (VITAMIN D3) 75 MCG (3000 UT) TABS, Take by mouth. 2,000 units daily, Disp: , Rfl:    Cobalamin Combinations (VITAMIN B12-FOLIC ACID PO), Take 1 tablet by mouth daily. , Disp: , Rfl:    Coenzyme Q10 (COQ10) 100 MG CAPS, Take 1 tablet by mouth daily. , Disp: , Rfl:    diclofenac Sodium (VOLTAREN) 1 % GEL, Apply topically 4 times daily PRN, Disp: 100 g, Rfl: 11   DULoxetine (CYMBALTA) 20 MG capsule, Take 1 capsule (20 mg total) by mouth 2 (two) times daily., Disp: 180 capsule, Rfl: 1   fluticasone (FLONASE) 50 MCG/ACT nasal spray, Place 2 sprays into both nostrils daily., Disp: 16 g, Rfl: 11   glipiZIDE (GLUCOTROL) 10 MG tablet, Take 1 tablet (10 mg total) by mouth 2 (two) times daily before a meal., Disp: 180 tablet, Rfl: 1   insulin detemir (LEVEMIR) 100 UNIT/ML FlexPen, Inject 60-70 Units into the skin at bedtime., Disp: 60 mL,  Rfl: 5   Insulin Pen Needle 31G X 5 MM MISC, Use as directed, Disp: 100 each, Rfl: 3   Magnesium 400 MG CAPS, Take 1 tablet by mouth daily. , Disp: , Rfl:    Menaquinone-7 (VITAMIN K2) 100 MCG CAPS, Take 1 tablet by mouth daily. , Disp: , Rfl:    metFORMIN (GLUCOPHAGE) 1000 MG tablet, Take 1 tablet (1,000 mg total) by mouth 2 (two) times daily with a meal., Disp: 180 tablet, Rfl: 1   metoprolol succinate (TOPROL-XL) 100 MG 24 hr tablet, Take 1 tablet (100 mg total) by mouth daily., Disp: 90 tablet, Rfl: 1   Misc Natural Products (TART CHERRY ADVANCED PO), Take by mouth. , Disp: , Rfl:    Multiple Vitamin (MULTIVITAMIN) capsule, Take 1 capsule by mouth daily. , Disp: , Rfl:    nitroGLYCERIN (NITROSTAT) 0.4 MG SL tablet, Place under the tongue. , Disp: , Rfl:    rosuvastatin (CRESTOR)  20 MG tablet, Take 20 mg by mouth daily., Disp: , Rfl:    Semaglutide (RYBELSUS) 7 MG TABS, Take 7 mg by mouth daily., Disp: 90 tablet, Rfl: 1   Turmeric 500 MG CAPS, Take by mouth. , Disp: , Rfl:    valsartan-hydrochlorothiazide (DIOVAN-HCT) 320-25 MG tablet, Take 1 tablet by mouth daily., Disp: 90 tablet, Rfl: 1   Zinc 25 MG TABS, Take 1 tablet by mouth daily. , Disp: , Rfl:   Current Facility-Administered Medications:    0.9 %  sodium chloride infusion, 500 mL, Intravenous, Once, Cirigliano, Vito V, DO  Allergies  Allergen Reactions   Statins     Lipitor,simvatatin, pravastatin.welchol cause myalgia          Objective:  Physical Exam  General: AAO x3, NAD  Dermatological: Mild bruising present to the hallux but there is no open sores identified.  No erythema.  Vascular: Dorsalis Pedis artery and Posterior Tibial artery pedal pulses are 2/4 bilateral with immedate capillary fill time.  There is no pain with calf compression, swelling, warmth, erythema.   Neruologic: Sensation decreased with Semmes Weinstein monofilament.  Musculoskeletal: No significant discomfort although she does have neuropathy.  Decreased range of motion of first MPJ.  Muscular strength 5/5 in all groups tested bilateral.  Gait: Unassisted, Nonantalgic.       Assessment:   Contusion left hallux     Plan:  -Treatment options discussed including all alternatives, risks, and complications -Etiology of symptoms were discussed -X-rays were obtained and reviewed with the patient.  On the AP view there is a corticated small osseous fragment medial to the phalanx likely chronic.  No obvious signs of acute fracture. -Overall since icing elevating the bruising has improved.  Recommended continue with this.  Discussed wearing a stiffer soled shoe.  If not improving next week or 2 if there is any worsening let me know. -From a diabetes standpoint she does have neuropathy but no open sores.  Recommend follow-up  at least 6 months for diabetic foot exam.  Trula Slade DPM

## 2020-12-08 NOTE — Patient Instructions (Signed)

## 2021-01-04 ENCOUNTER — Other Ambulatory Visit: Payer: Self-pay

## 2021-01-04 ENCOUNTER — Ambulatory Visit (INDEPENDENT_AMBULATORY_CARE_PROVIDER_SITE_OTHER): Payer: Medicare Other | Admitting: Family Medicine

## 2021-01-04 ENCOUNTER — Encounter: Payer: Self-pay | Admitting: Family Medicine

## 2021-01-04 VITALS — BP 132/74 | HR 62 | Temp 97.5°F | Ht 70.0 in | Wt 280.0 lb

## 2021-01-04 DIAGNOSIS — E0865 Diabetes mellitus due to underlying condition with hyperglycemia: Secondary | ICD-10-CM

## 2021-01-04 DIAGNOSIS — E1149 Type 2 diabetes mellitus with other diabetic neurological complication: Secondary | ICD-10-CM | POA: Diagnosis not present

## 2021-01-04 DIAGNOSIS — E785 Hyperlipidemia, unspecified: Secondary | ICD-10-CM

## 2021-01-04 DIAGNOSIS — I10 Essential (primary) hypertension: Secondary | ICD-10-CM

## 2021-01-04 DIAGNOSIS — Z23 Encounter for immunization: Secondary | ICD-10-CM | POA: Diagnosis not present

## 2021-01-04 DIAGNOSIS — E1140 Type 2 diabetes mellitus with diabetic neuropathy, unspecified: Secondary | ICD-10-CM

## 2021-01-04 DIAGNOSIS — IMO0002 Reserved for concepts with insufficient information to code with codable children: Secondary | ICD-10-CM

## 2021-01-04 DIAGNOSIS — M76822 Posterior tibial tendinitis, left leg: Secondary | ICD-10-CM

## 2021-01-04 DIAGNOSIS — F418 Other specified anxiety disorders: Secondary | ICD-10-CM

## 2021-01-04 LAB — COMPREHENSIVE METABOLIC PANEL
ALT: 18 U/L (ref 0–35)
AST: 14 U/L (ref 0–37)
Albumin: 4.2 g/dL (ref 3.5–5.2)
Alkaline Phosphatase: 67 U/L (ref 39–117)
BUN: 9 mg/dL (ref 6–23)
CO2: 32 mEq/L (ref 19–32)
Calcium: 9.5 mg/dL (ref 8.4–10.5)
Chloride: 92 mEq/L — ABNORMAL LOW (ref 96–112)
Creatinine, Ser: 0.56 mg/dL (ref 0.40–1.20)
GFR: 93.98 mL/min (ref >60.00)
Glucose, Bld: 163 mg/dL — ABNORMAL HIGH (ref 70–99)
Potassium: 4.2 mEq/L (ref 3.5–5.1)
Sodium: 132 mEq/L — ABNORMAL LOW (ref 135–145)
Total Bilirubin: 0.5 mg/dL (ref 0.2–1.2)
Total Protein: 6.7 g/dL (ref 6.0–8.3)

## 2021-01-04 LAB — CBC WITH DIFFERENTIAL/PLATELET
Basophils Absolute: 0 10*3/uL (ref 0.0–0.1)
Basophils Relative: 0.7 % (ref 0.0–3.0)
Eosinophils Absolute: 0.1 10*3/uL (ref 0.0–0.7)
Eosinophils Relative: 1.8 % (ref 0.0–5.0)
HCT: 40.9 % (ref 36.0–46.0)
Hemoglobin: 13.6 g/dL (ref 12.0–15.0)
Lymphocytes Relative: 42 % (ref 12.0–46.0)
Lymphs Abs: 2.5 10*3/uL (ref 0.7–4.0)
MCHC: 33.4 g/dL (ref 30.0–36.0)
MCV: 88.7 fl (ref 78.0–100.0)
Monocytes Absolute: 0.5 10*3/uL (ref 0.1–1.0)
Monocytes Relative: 7.9 % (ref 3.0–12.0)
Neutro Abs: 2.9 10*3/uL (ref 1.4–7.7)
Neutrophils Relative %: 47.6 % (ref 43.0–77.0)
Platelets: 256 10*3/uL (ref 150.0–400.0)
RBC: 4.61 Mil/uL (ref 3.87–5.11)
RDW: 13.4 % (ref 11.5–15.5)
WBC: 6 10*3/uL (ref 4.0–10.5)

## 2021-01-04 LAB — POCT GLYCOSYLATED HEMOGLOBIN (HGB A1C)
HbA1c POC (<> result, manual entry): 9.8 % (ref 4.0–5.6)
HbA1c, POC (controlled diabetic range): 9.8 % — AB (ref 0.0–7.0)
HbA1c, POC (prediabetic range): 9.8 % — AB (ref 5.7–6.4)
Hemoglobin A1C: 9.8 % — AB (ref 4.0–5.6)

## 2021-01-04 LAB — LIPID PANEL
Cholesterol: 154 mg/dL (ref 0–200)
HDL: 49.4 mg/dL (ref >39.00)
LDL Cholesterol: 78 mg/dL (ref 0–99)
NonHDL: 105.02
Total CHOL/HDL Ratio: 3
Triglycerides: 134 mg/dL (ref 0.0–149.0)
VLDL: 26.8 mg/dL (ref 0.0–40.0)

## 2021-01-04 LAB — TSH: TSH: 1.28 u[IU]/mL (ref 0.35–5.50)

## 2021-01-04 MED ORDER — METOPROLOL SUCCINATE ER 100 MG PO TB24: 100.0000 mg | ORAL_TABLET | Freq: Every day | ORAL | 1 refills | Status: DC

## 2021-01-04 MED ORDER — VALSARTAN-HYDROCHLOROTHIAZIDE 320-25 MG PO TABS: 1.0000 | ORAL_TABLET | Freq: Every day | ORAL | 1 refills | Status: DC

## 2021-01-04 MED ORDER — INSULIN DETEMIR 100 UNIT/ML FLEXPEN: 40.0000 [IU] | PEN_INJECTOR | Freq: Two times a day (BID) | SUBCUTANEOUS | 1 refills | Status: DC

## 2021-01-04 MED ORDER — GLIPIZIDE 10 MG PO TABS: 10.0000 mg | ORAL_TABLET | Freq: Two times a day (BID) | ORAL | 1 refills | Status: DC

## 2021-01-04 MED ORDER — METFORMIN HCL 1000 MG PO TABS: 1000.0000 mg | ORAL_TABLET | Freq: Two times a day (BID) | ORAL | 1 refills | Status: DC

## 2021-01-04 MED ORDER — ALBUTEROL SULFATE HFA 108 (90 BASE) MCG/ACT IN AERS: 1.0000 | INHALATION_SPRAY | Freq: Four times a day (QID) | RESPIRATORY_TRACT | 11 refills | Status: DC | PRN

## 2021-01-04 MED ORDER — DULOXETINE HCL 20 MG PO CPEP: 20.0000 mg | ORAL_CAPSULE | Freq: Two times a day (BID) | ORAL | 1 refills | Status: DC

## 2021-01-04 MED ORDER — AMLODIPINE BESYLATE 10 MG PO TABS: 10.0000 mg | ORAL_TABLET | Freq: Every day | ORAL | 1 refills | Status: DC

## 2021-01-04 MED ORDER — SITAGLIPTIN PHOSPHATE 100 MG PO TABS: 100.0000 mg | ORAL_TABLET | Freq: Every day | ORAL | 1 refills | Status: DC

## 2021-01-04 NOTE — Patient Instructions (Addendum)
Great to see you today.  I have refilled the medication(s) we provide.   If labs were collected, we will inform you of lab results once received either by echart message or telephone call.   - echart message- for normal results that have been seen by the patient already.   - telephone call: abnormal results or if patient has not viewed results in their echart.     Next appt 3.5 mos.    Diabetes Mellitus and Exercise Exercising regularly is important for overall health, especially for people who have diabetes mellitus. Exercising is not only about losing weight. It has many other health benefits, such as increasing muscle strength and bone density and reducing body fat and stress. This leads to improved fitness, flexibility, and endurance, all of which result in better overall health. What are the benefits of exercise if I have diabetes? Exercise has many benefits for people with diabetes. They include: Helping to lower and control blood sugar (glucose). Helping the body to respond better to the hormone insulin by improving insulin sensitivity. Reducing how much insulin the body needs. Lowering the risk for heart disease by: Lowering "bad" cholesterol and triglyceride levels. Increasing "good" cholesterol levels. Lowering blood pressure. Lowering blood glucose levels. What is my activity plan? Your health care provider or certified diabetes educator can help you make a plan for the type and frequency of exercise that works for you. This is called your activity plan. Be sure to: Get at least 150 minutes of medium-intensity or high-intensity exercise each week. Exercises may include brisk walking, biking, or water aerobics. Do stretching and strengthening exercises, such as yoga or weight lifting, at least 2 times a week. Spread out your activity over at least 3 days of the week. Get some form of physical activity each day. Do not go more than 2 days in a row without some kind of  physical activity. Avoid being inactive for more than 90 minutes at a time. Take frequent breaks to walk or stretch. Choose exercises or activities that you enjoy. Set realistic goals. Start slowly and gradually increase your exercise intensity over time. How do I manage my diabetes during exercise? Monitor your blood glucose Check your blood glucose before and after exercising. If your blood glucose is: 240 mg/dL (13.3 mmol/L) or higher before you exercise, check your urine for ketones. These are chemicals created by the liver. If you have ketones in your urine, do not exercise until your blood glucose returns to normal. 100 mg/dL (5.6 mmol/L) or lower, eat a snack containing 15-20 grams of carbohydrate. Check your blood glucose 15 minutes after the snack to make sure that your glucose level is above 100 mg/dL (5.6 mmol/L) before you start your exercise. Know the symptoms of low blood glucose (hypoglycemia) and how to treat it. Your risk for hypoglycemia increases during and after exercise. Follow these tips and your health care provider's instructions Keep a carbohydrate snack that is fast-acting for use before, during, and after exercise to help prevent or treat hypoglycemia. Avoid injecting insulin into areas of the body that are going to be exercised. For example, avoid injecting insulin into: Your arms, when you are about to play tennis. Your legs, when you are about to go jogging. Keep records of your exercise habits. Doing this can help you and your health care provider adjust your diabetes management plan as needed. Write down: Food that you eat before and after you exercise. Blood glucose levels before and after you exercise.  The type and amount of exercise you have done. Work with your health care provider when you start a new exercise or activity. He or she may need to: Make sure that the activity is safe for you. Adjust your insulin, other medicines, and food that you eat. Drink  plenty of water while you exercise. This prevents loss of water (dehydration) and problems caused by a lot of heat in the body (heat stroke). Where to find more information American Diabetes Association: www.diabetes.org Summary Exercising regularly is important for overall health, especially for people who have diabetes mellitus. Exercising has many health benefits. It increases muscle strength and bone density and reduces body fat and stress. It also lowers and controls blood glucose. Your health care provider or certified diabetes educator can help you make an activity plan for the type and frequency of exercise that works for you. Work with your health care provider to make sure any new activity is safe for you. Also work with your health care provider to adjust your insulin, other medicines, and the food you eat. This information is not intended to replace advice given to you by your health care provider. Make sure you discuss any questions you have with your health care provider. Document Revised: 12/22/2018 Document Reviewed: 12/22/2018 Elsevier Patient Education  [place].

## 2021-01-04 NOTE — Progress Notes (Signed)
This visit occurred during the SARS-CoV-2 public health emergency.  Safety protocols were in place, including screening questions prior to the visit, additional usage of staff PPE, and extensive cleaning of exam room while observing appropriate contact time as indicated for disinfecting solutions.    Julie Osborne , 1950-12-23, 70 y.o., female MRN: [id number] Patient Care Team    Relationship Specialty Notifications Start End  Ma Hillock, DO PCP - General Family Medicine  01/13/16   Leda Roys, [place] Referring Physician Podiatry  12/24/19   Newt Minion, MD Consulting Physician Orthopedic Surgery  08/16/20     Chief Complaint  Patient presents with   Diabetes    [facility]; pt is fasting     Subjective:Julie Osborne is a 70 y.o. female patient present for Diabetes/morbid obesity:  Pt reports compliance with metformin 1000 mg BID, glipizide 10 mg BID,  and Levemir  70 units nightly.patient reports the Rybelsus was too expensive so she only took the first 30 days of the lower dose.  Patient denies any new dizziness, hyperglycemic or hypoglycemic events. Patient denies numbness, tingling in the extremities or nonhealing wounds of feet.  She does have neuropathy of toes and hands on occasions. She is compliant with  gabapentin 300/400.  She has follow-up with Dr. Sharol Given and podiatry for care of her feet. Onset: 2005 PNA series: completed Flu shot: UTD 2021 today (recommneded yearly) Foot exam: Completed.  Following with podiatry and [place] on [place]. Dr. Dennie Fetters 12/29/2019 A1c: 7.9--> 7.5 --> 6.2 --> 7.6--> 6.4 --> 6.8 >> 7.6>>6.8>>7.9>8.2> 8.1>9.6>9.8 Microallb: On ARB   HTN/hyperlipidemia: Pt reports compliance with  amlodipine 10, valsartan-hctz 10-320-25, metoprolol 100 mg daily, Crestor and ASA 81 today. Patient denies chest pain, shortness of breath, dizziness or lower extremity edema.     Anxiety: Patient reports she feels better since starting the  Cymbalta  20 mg twice daily.  She still is having increased stress, but feels she is coping better with it.  Prior note: Pt reports since her MIL moved in with them a few weeks ago she finds herself not able to control her emotions. She is having panic attacks in the middle of the night. She feels anxious and has not been able to find a way to cope with the changes. She states her MIL has some cognitive decline and is unable to live on her own. [name] reports she has always had difficulty getting along with her MIL, but it is more difficult now since she is living with her.      Depression screen [facility] 2/9 08/16/2020 03/30/2020 02/24/2020 02/26/2018 08/16/2017  Decreased Interest 0 0 0 0 0  Down, Depressed, Hopeless 1 1 0 0 0  PHQ - 2 Score 1 1 0 0 0  Altered sleeping 0 3 - - -  Tired, decreased energy 1 3 - - -  Change in appetite 1 3 - - -  Feeling bad or failure about yourself  0 3 - - -  Trouble concentrating 0 3 - - -  Moving slowly or fidgety/restless 0 0 - - -  Suicidal thoughts 0 0 - - -  PHQ-9 Score 3 16 - - -    Allergies  Allergen Reactions   Statins     Lipitor,simvatatin, pravastatin.welchol cause myalgia   Social History   Social History Narrative   Married to [employer], 2 children.    BS, RN retired now Printmaker.    Drinks caffeine, uses  herbal remedies. Daily vitmain use.   Wears her seatbelt, smoke detector in the home.    exercises routinely.    She feels safe in her relationships.    Past Medical History:  Diagnosis Date   Apnea    Closed fracture of neck of left radius 10/14/2017   Concussion    Degenerative arthritis    generalized/back/LEFT foot   Diabetic neuropathy ([place]) 2017   Diverticulosis 06/19/2013   sigmoid colon on colonoscopy   Hyperlipidemia 2000   on meds   Hypertension, benign 1996   on meds   Lumbar radiculopathy    Severe L4/L5 radiculopathy by old records.    Pes planus 12/17/2011   Tenosynovitis of ankle 2017   post tibial tendon dysfunction  stage 5 (PTTD)   Uncontrolled diabetes mellitus ([place]) 2005   on meds   Past Surgical History:  Procedure Laterality Date   CORONARY ANGIOPLASTY WITH STENT PLACEMENT  10/17/2018   DES x2/LAD   EXPLORATORY LAPAROTOMY  1976   endometriosis   MINOR HEMORRHOIDECTOMY  2006   OTHER SURGICAL HISTORY Right 1975   venous ligation   Family History  Problem Relation Age of Onset   COPD Mother    Diabetes Mellitus II Mother    Heart failure Mother    Osteoarthritis Father    Cancer Father        tumor found behind xyphoid process   Diabetes Sister    Diabetes Brother    Dementia Maternal Grandmother    Heart disease Maternal Grandmother    Heart disease Maternal Grandfather    Stroke Maternal Grandfather    Heart failure Maternal Aunt    Lung cancer Paternal Grandfather    Lung cancer Paternal Aunt    Colon polyps Neg Hx    Colon cancer Neg Hx    Esophageal cancer Neg Hx    Stomach cancer Neg Hx    Rectal cancer Neg Hx    Allergies as of 01/04/2021       Reactions   Statins    Lipitor,simvatatin, pravastatin.welchol cause myalgia        Medication List        Accurate as of January 04, 2021 11:59 PM. If you have any questions, ask your nurse or doctor.          STOP taking these medications    acetaminophen-codeine 300-30 MG tablet Commonly known as: TYLENOL #3 Stopped by: [name], DO   amoxicillin 500 MG tablet Commonly known as: AMOXIL Stopped by: [name], DO   Rybelsus 7 MG Tabs Generic drug: Semaglutide Stopped by: [name], DO       TAKE these medications    albuterol 108 (90 Base) MCG/ACT inhaler Commonly known as: VENTOLIN HFA Inhale 1-2 puffs into the lungs every 6 (six) hours as needed for wheezing or shortness of breath.   amLODipine 10 MG tablet Commonly known as: NORVASC Take 1 tablet (10 mg total) by mouth daily.   aspirin 81 MG chewable tablet Chew by mouth daily.   calcium citrate-vitamin D 315-200 MG-UNIT  tablet Commonly known as: CITRACAL+D Take 1 tablet by mouth daily.   CoQ10 100 MG Caps Take 1 tablet by mouth daily.   diclofenac Sodium 1 % Gel Commonly known as: Voltaren Apply topically 4 times daily PRN   DULoxetine 20 MG capsule Commonly known as: CYMBALTA Take 1 capsule (20 mg total) by mouth 2 (two) times daily.   FIBER-CAPS PO Take by mouth.   fluticasone  50 MCG/ACT nasal spray Commonly known as: FLONASE Place 2 sprays into both nostrils daily.   glipiZIDE 10 MG tablet Commonly known as: GLUCOTROL Take 1 tablet (10 mg total) by mouth 2 (two) times daily before a meal.   insulin detemir 100 UNIT/ML FlexPen Commonly known as: LEVEMIR Inject 40 Units into the skin 2 (two) times daily. What changed:  how much to take when to take this Changed by: [name], DO   Insulin Pen Needle 31G X 5 MM Misc Use as directed   Magnesium 400 MG Caps Take 1 tablet by mouth daily.   metFORMIN 1000 MG tablet Commonly known as: GLUCOPHAGE Take 1 tablet (1,000 mg total) by mouth 2 (two) times daily with a meal.   metoprolol succinate 100 MG 24 hr tablet Commonly known as: TOPROL-XL Take 1 tablet (100 mg total) by mouth daily.   multivitamin capsule Take 1 capsule by mouth daily.   nitroGLYCERIN 0.4 MG SL tablet Commonly known as: NITROSTAT Place under the tongue.   rosuvastatin 20 MG tablet Commonly known as: CRESTOR Take 20 mg by mouth daily.   sitaGLIPtin 100 MG tablet Commonly known as: JANUVIA Take 1 tablet (100 mg total) by mouth daily. Started by: [name], DO   TART CHERRY ADVANCED PO Take by mouth.   Turmeric 500 MG Caps Take by mouth.   valsartan-hydrochlorothiazide 320-25 MG tablet Commonly known as: DIOVAN-HCT Take 1 tablet by mouth daily.   VITAMIN B12-FOLIC ACID PO Take 1 tablet by mouth daily.   vitamin C 100 MG tablet Take 1 tablet by mouth daily.   Vitamin D3 75 MCG (3000 UT) Tabs Take by mouth. 2,000 units daily   Vitamin K2  100 MCG Caps Take 1 tablet by mouth daily.   Zinc 25 MG Tabs Take 1 tablet by mouth daily.        All past medical history, surgical history, allergies, family history, immunizations andmedications were updated in the EMR today and reviewed under the history and medication portions of their EMR.     ROS: Negative, with the exception of above mentioned in HPI   Objective:  BP 132/74   Pulse 62   Temp (!) 97.5 F (36.4 C) (Oral)   Ht '5\' 10"'  (1.778 m)   Wt 280 lb (127 kg)   SpO2 100%   BMI 40.18 kg/m  Body mass index is 40.18 kg/m. Gen: Afebrile. No acute distress.  Nontoxic, very pleasant obese female. HENT: AT. [place].  No cough.  No hoarseness. Eyes:Pupils Equal Round Reactive to light, Extraocular movements intact,  Conjunctiva without redness, discharge or icterus. Neck/lymp/endocrine: Supple, no lymphadenopathy, no thyromegaly CV: RRR no murmur, no edema, +2/4 P posterior tibialis pulses Chest: CTAB, no wheeze or crackles Skin: No rashes, purpura or petechiae.  Neuro:  Normal gait. PERLA. EOMi. Alert. Oriented x3 Psych: Normal affect, dress and demeanor. Normal speech. Normal thought content and judgment..    No results found. No results found. No results found for this or any previous visit (from the past 24 hour(s)).   Assessment/Plan: Noha Milberger is a 70 y.o. female present for OV for  Hypertension, benign/HLD/coronary artery disease -Stable -Continue amlodipine 10 mg daily> advised her to start taking at night -Continue valsartan/HCTZ 320-12.5.   -Continue metoprolol to 100 mg  - low sodium, exercise.  - routine exercise encouraged.  - f/u 3 mos [place]    Morbid obesity (HCC)/Other diabetic neurological complication associated with type 2 diabetes mellitus ([place]) Continues to elevate  now uncontrolled.  Continue metformin 1000 mg twice daily Continue glipizide  10 mg twice daily Continue Levemir to 35 units twice daily  Januvia 100 mg prescribed> patient  aware if this is too expensive for her she needs to call her insurance and find out what is on her formulary, that she is not already taking, that is affordable for her. Tried: Rybelsus > pt reports it is too expensive did not start.  Continue  gabapentin (see below for dosage) PNA series: completed Flu shot: UTD 2022- provied today (recommneded yearly) Foot exam: Completed.  Also follows with orthopedics and podiatry.  Eye exam: 2021- [facility] on [place]. Dr. Dennie Fetters A1c: 7.9--> 7.5 --> 6.2 --> 7.6--> 6.4 --> 6.8 >> 7.6>>6.8>>7.9>8.2> 8.1> 9.6> 9.8 Microallb: On ARB Referral to endocrinology placed   Lumbar back pain with radiculopathy affecting lower extremity/Spinal stenosis, lumbar region, without neurogenic claudication/anxiety Stable Continue Cymbalta to 40 mg daily - Ambulatory referral to Psychology placed 03/2020  Return in about 3 months (around 04/17/2021) for [place] (30 min). Once established with endocrine can go every 6 months as long as they are managing diabetes  Reviewed expectations re: course of current medical issues. Discussed self-management of symptoms. Outlined signs and symptoms indicating need for more acute intervention. Patient verbalized understanding and all questions were answered. Patient received an After-Visit Summary.    Orders Placed This Encounter  Procedures   Flu Vaccine QUAD High Dose(Fluad)   TSH   CBC w/Diff   Comp Met (CMET)   Lipid panel   Ambulatory referral to Endocrinology   POCT HgB A1C   Meds ordered this encounter  Medications   albuterol (VENTOLIN HFA) 108 (90 Base) MCG/ACT inhaler    Sig: Inhale 1-2 puffs into the lungs every 6 (six) hours as needed for wheezing or shortness of breath.    Dispense:  8 g    Refill:  11   amLODipine (NORVASC) 10 MG tablet    Sig: Take 1 tablet (10 mg total) by mouth daily.    Dispense:  90 tablet    Refill:  1    Please discontinue amlodipine/valsartan/HCTZ combo pill   DULoxetine (CYMBALTA) 20  MG capsule    Sig: Take 1 capsule (20 mg total) by mouth 2 (two) times daily.    Dispense:  180 capsule    Refill:  1   glipiZIDE (GLUCOTROL) 10 MG tablet    Sig: Take 1 tablet (10 mg total) by mouth 2 (two) times daily before a meal.    Dispense:  180 tablet    Refill:  1   insulin detemir (LEVEMIR) 100 UNIT/ML FlexPen    Sig: Inject 40 Units into the skin 2 (two) times daily.    Dispense:  75 mL    Refill:  1   metFORMIN (GLUCOPHAGE) 1000 MG tablet    Sig: Take 1 tablet (1,000 mg total) by mouth 2 (two) times daily with a meal.    Dispense:  180 tablet    Refill:  1   metoprolol succinate (TOPROL-XL) 100 MG 24 hr tablet    Sig: Take 1 tablet (100 mg total) by mouth daily.    Dispense:  90 tablet    Refill:  1   valsartan-hydrochlorothiazide (DIOVAN-HCT) 320-25 MG tablet    Sig: Take 1 tablet by mouth daily.    Dispense:  90 tablet    Refill:  1   sitaGLIPtin (JANUVIA) 100 MG tablet    Sig: Take 1 tablet (100 mg total) by  mouth daily.    Dispense:  90 tablet    Refill:  1    Referral Orders         Ambulatory referral to Endocrinology       Note is dictated utilizing voice recognition software. Although note has been proof read prior to signing, occasional typographical errors still can be missed. If any questions arise, please do not hesitate to call for verification.   electronically signed by:  [name], DO  [place]

## 2021-01-05 ENCOUNTER — Telehealth: Payer: Self-pay

## 2021-01-05 ENCOUNTER — Ambulatory Visit: Payer: Medicare Other | Admitting: Family Medicine

## 2021-01-05 NOTE — Telephone Encounter (Signed)
Patient returning call to [place].  Please cal her back whenever you have a chance.  Thank you

## 2021-01-05 NOTE — Telephone Encounter (Signed)
Spoke with pt regarding labs and instructions.   

## 2021-01-07 ENCOUNTER — Other Ambulatory Visit: Payer: Self-pay | Admitting: Family Medicine

## 2021-01-11 ENCOUNTER — Encounter: Payer: Self-pay | Admitting: Family Medicine

## 2021-01-11 ENCOUNTER — Telehealth: Payer: Self-pay

## 2021-01-11 NOTE — Telephone Encounter (Signed)
Per patient's med list she is already on a sulfonylurea (glipizide) and she is already on metformin.,  Therefore she is already on all the drug classes for her diabetes that are listed as "not required, "that are safe for her to take. The only other medicine on the list preferred is Actos (pioglitazone), that is contraindicated with her history of heart disease.

## 2021-01-11 NOTE — Telephone Encounter (Signed)
PA sent to plan

## 2021-01-11 NOTE — Telephone Encounter (Signed)
Faxed received for PA being required for Januiva 100mg  tabs.  Insurance would like to know if provider would consider one of the covered meds to prevent higher co pays.   Please advise if you would like to proceed with PA?  Januvia 100MG  tablets Required Glimepiride Not Required GlipiZIDE Not Required GlipiZIDE ER Not Required GlipiZIDE-MetFORMIN HCl Not Required MetFORMIN HCl Not Required MetFORMIN HCl ER Not Required Pioglitazone HCl Not Required Alogliptin Benzoate Required Alogliptin-Metformin HCl Required [place] Required Glyxambi Required Janumet Required Janumet Required Janumet XR Required Janumet XR Required Janumet XR Required Jardiance Required Jentadueto Required Kazano Required Kazano Required Nesina Required Onglyza Required Rybelsus Required Rybelsus Required Steglatro Required Steglujan Required Steglujan Required Tradjenta Required Trijardy XR Required Trulicity Required Trulicity Required Victoza Required Victoza Required

## 2021-01-16 MED ORDER — SITAGLIPTIN PHOSPHATE 100 MG PO TABS: 100.0000 mg | ORAL_TABLET | Freq: Every day | ORAL | 1 refills | Status: DC

## 2021-01-16 NOTE — Telephone Encounter (Signed)
Approved on October 5 Request Reference Number: [id number]. JANUVIA TAB 100MG  is approved through 01/11/2022. Your patient may now fill this prescription and it will be covered.

## 2021-02-01 ENCOUNTER — Telehealth: Payer: Self-pay | Admitting: Family Medicine

## 2021-02-01 NOTE — Telephone Encounter (Signed)
Left message for patient to schedule Annual Wellness Visit.  Please schedule with Nurse Health Advisor Julie Greer, RN at [facility]. Please call [phone] ask for Kathy  

## 2021-03-12 ENCOUNTER — Encounter: Payer: Self-pay | Admitting: Family Medicine

## 2021-03-16 NOTE — Telephone Encounter (Signed)
Not on current med list. Please advise if appt is needed.

## 2021-03-17 NOTE — Telephone Encounter (Signed)
Patient has an appointment next week.  We can discuss muscle relaxer she is requesting at that time.

## 2021-03-24 ENCOUNTER — Ambulatory Visit (INDEPENDENT_AMBULATORY_CARE_PROVIDER_SITE_OTHER): Payer: Medicare Other | Admitting: Family Medicine

## 2021-03-24 ENCOUNTER — Encounter: Payer: Self-pay | Admitting: Family Medicine

## 2021-03-24 ENCOUNTER — Other Ambulatory Visit: Payer: Self-pay

## 2021-03-24 VITALS — BP 132/78 | HR 63 | Temp 98.5°F | Ht 70.0 in | Wt 285.0 lb

## 2021-03-24 DIAGNOSIS — E785 Hyperlipidemia, unspecified: Secondary | ICD-10-CM | POA: Diagnosis not present

## 2021-03-24 DIAGNOSIS — E1165 Type 2 diabetes mellitus with hyperglycemia: Secondary | ICD-10-CM | POA: Diagnosis not present

## 2021-03-24 DIAGNOSIS — Z9861 Coronary angioplasty status: Secondary | ICD-10-CM

## 2021-03-24 DIAGNOSIS — M48061 Spinal stenosis, lumbar region without neurogenic claudication: Secondary | ICD-10-CM

## 2021-03-24 DIAGNOSIS — M5416 Radiculopathy, lumbar region: Secondary | ICD-10-CM | POA: Diagnosis not present

## 2021-03-24 DIAGNOSIS — I2510 Atherosclerotic heart disease of native coronary artery without angina pectoris: Secondary | ICD-10-CM

## 2021-03-24 DIAGNOSIS — E1149 Type 2 diabetes mellitus with other diabetic neurological complication: Secondary | ICD-10-CM

## 2021-03-24 DIAGNOSIS — F418 Other specified anxiety disorders: Secondary | ICD-10-CM

## 2021-03-24 DIAGNOSIS — I10 Essential (primary) hypertension: Secondary | ICD-10-CM

## 2021-03-24 LAB — POCT GLYCOSYLATED HEMOGLOBIN (HGB A1C)
HbA1c POC (<> result, manual entry): 9.4 % (ref 4.0–5.6)
HbA1c, POC (controlled diabetic range): 9.4 % — AB (ref 0.0–7.0)
HbA1c, POC (prediabetic range): 9.4 % — AB (ref 5.7–6.4)
Hemoglobin A1C: 9.4 % — AB (ref 4.0–5.6)

## 2021-03-24 MED ORDER — TIZANIDINE HCL 4 MG PO TABS
4.0000 mg | ORAL_TABLET | Freq: Four times a day (QID) | ORAL | 5 refills | Status: DC | PRN
Start: 2021-03-24 — End: 2021-07-05

## 2021-03-24 MED ORDER — ROSUVASTATIN CALCIUM 20 MG PO TABS: 20.0000 mg | ORAL_TABLET | Freq: Every day | ORAL | 3 refills | Status: DC

## 2021-03-24 MED ORDER — TOUJEO SOLOSTAR 300 UNIT/ML ~~LOC~~ SOPN
40.0000 [IU] | PEN_INJECTOR | Freq: Two times a day (BID) | SUBCUTANEOUS | 11 refills | Status: DC
Start: 2021-04-10 — End: 2021-09-29

## 2021-03-24 MED ORDER — DULOXETINE HCL 20 MG PO CPEP: 20.0000 mg | ORAL_CAPSULE | Freq: Two times a day (BID) | ORAL | 1 refills | Status: DC

## 2021-03-24 MED ORDER — GLIPIZIDE 10 MG PO TABS: 10.0000 mg | ORAL_TABLET | Freq: Two times a day (BID) | ORAL | 1 refills | Status: DC

## 2021-03-24 MED ORDER — METOPROLOL SUCCINATE ER 100 MG PO TB24: 100.0000 mg | ORAL_TABLET | Freq: Every day | ORAL | 1 refills | Status: DC

## 2021-03-24 MED ORDER — FLUTICASONE PROPIONATE 50 MCG/ACT NA SUSP: 2.0000 | Freq: Every day | NASAL | 11 refills | Status: DC

## 2021-03-24 MED ORDER — AMLODIPINE BESYLATE 10 MG PO TABS: 10.0000 mg | ORAL_TABLET | Freq: Every day | ORAL | 1 refills | Status: DC

## 2021-03-24 MED ORDER — OZEMPIC (0.25 OR 0.5 MG/DOSE) 2 MG/1.5ML ~~LOC~~ SOPN
0.5000 mg | PEN_INJECTOR | SUBCUTANEOUS | 2 refills | Status: DC
Start: 2021-03-24 — End: 2021-12-22

## 2021-03-24 MED ORDER — METFORMIN HCL 1000 MG PO TABS: 1000.0000 mg | ORAL_TABLET | Freq: Two times a day (BID) | ORAL | 1 refills | Status: DC

## 2021-03-24 MED ORDER — VALSARTAN-HYDROCHLOROTHIAZIDE 320-25 MG PO TABS: 1.0000 | ORAL_TABLET | Freq: Every day | ORAL | 1 refills | Status: DC

## 2021-03-24 NOTE — Patient Instructions (Addendum)
Phone: [phone]> please [facility] for establishment appt. The referral had been placed for you in September.  Your a1c was 9.4 today, goal a1c is <7 Start ozempic weekly injections.    Start using bag balm on your knee wound.

## 2021-03-24 NOTE — Progress Notes (Signed)
This visit occurred during the SARS-CoV-2 public health emergency.  Safety protocols were in place, including screening questions prior to the visit, additional usage of staff PPE, and extensive cleaning of exam room while observing appropriate contact time as indicated for disinfecting solutions.    Julie Osborne , 06/22/1950, 70 y.o., female MRN: [id number] Patient Care Team    Relationship Specialty Notifications Start End  Ma Hillock, DO PCP - General Family Medicine  01/13/16   Leda Roys, [place] Referring Physician Podiatry  12/24/19   Newt Minion, MD Consulting Physician Orthopedic Surgery  08/16/20     Chief Complaint  Patient presents with   Diabetes    [place]; pt is fasting   Back Pain    Chronic      Subjective:Julie Osborne is a 70 y.o. female patient present for Diabetes/morbid obesity:  Pt reports compliance with metformin 1000 mg BID, glipizide 10 mg BID,  and Levemir  80 divided dose (BID). Januvia 100 mg qd. Patient denies dizziness, hyperglycemic or hypoglycemic events. Patient denies numbness, tingling in the extremities or nonhealing wounds of feet.  She does have neuropathy of toes and hands on occasions. She is compliant with  gabapentin 300/400.  She has follow-up with Dr. Sharol Given and podiatry for care of her feet. Onset: 2005  HTN/hyperlipidemia: Pt reports compliance with  amlodipine 10, valsartan-hctz 10-320-25, metoprolol 100 mg daily, Crestor and ASA 81 today.Patient denies dizziness, hyperglycemic or hypoglycemic events. Patient denies chest pain, shortness of breath, dizziness or lower extremity edema.   Anxiety: Patient reports she feels better since starting the Cymbalta  20 mg twice daily.  She still is having increased stress, but feels she is coping better with it.  Prior note: Pt reports since her MIL moved in with them a few weeks ago she finds herself not able to control her emotions. She is having panic attacks in the middle of the  night. She feels anxious and has not been able to find a way to cope with the changes. She states her MIL has some cognitive decline and is unable to live on her own. Julie Osborne reports she has always had difficulty getting along with her MIL, but it is more difficult now since she is living with her.    Depression screen [facility] 2/9 03/24/2021 08/16/2020 03/30/2020 02/24/2020 02/26/2018  Decreased Interest 0 0 0 0 0  Down, Depressed, Hopeless 0 1 1 0 0  PHQ - 2 Score 0 1 1 0 0  Altered sleeping - 0 3 - -  Tired, decreased energy - 1 3 - -  Change in appetite - 1 3 - -  Feeling bad or failure about yourself  - 0 3 - -  Trouble concentrating - 0 3 - -  Moving slowly or fidgety/restless - 0 0 - -  Suicidal thoughts - 0 0 - -  PHQ-9 Score - 3 16 - -    Allergies  Allergen Reactions   Statins     Lipitor,simvatatin, pravastatin.welchol cause myalgia   Social History   Social History Narrative   Married to [employer], 2 children.    BS, RN retired now Printmaker.    Drinks caffeine, uses herbal remedies. Daily vitmain use.   Wears her seatbelt, smoke detector in the home.    exercises routinely.    She feels safe in her relationships.    Past Medical History:  Diagnosis Date   Apnea    Closed fracture of neck of  left radius 10/14/2017   Concussion    Degenerative arthritis    generalized/back/LEFT foot   Diabetic neuropathy ([place]) 2017   Diverticulosis 06/19/2013   sigmoid colon on colonoscopy   Hyperlipidemia 2000   on meds   Hypertension, benign 1996   on meds   Lumbar radiculopathy    Severe L4/L5 radiculopathy by old records.    Pes planus 12/17/2011   Tenosynovitis of ankle 2017   post tibial tendon dysfunction stage 5 (PTTD)   Uncontrolled diabetes mellitus 2005   on meds   Past Surgical History:  Procedure Laterality Date   CORONARY ANGIOPLASTY WITH STENT PLACEMENT  10/17/2018   DES x2/LAD   EXPLORATORY LAPAROTOMY  1976   endometriosis   MINOR HEMORRHOIDECTOMY  2006   OTHER  SURGICAL HISTORY Right 1975   venous ligation   Family History  Problem Relation Age of Onset   COPD Mother    Diabetes Mellitus II Mother    Heart failure Mother    Osteoarthritis Father    Cancer Father        tumor found behind xyphoid process   Diabetes Sister    Diabetes Brother    Dementia Maternal Grandmother    Heart disease Maternal Grandmother    Heart disease Maternal Grandfather    Stroke Maternal Grandfather    Heart failure Maternal Aunt    Lung cancer Paternal Grandfather    Lung cancer Paternal Aunt    Colon polyps Neg Hx    Colon cancer Neg Hx    Esophageal cancer Neg Hx    Stomach cancer Neg Hx    Rectal cancer Neg Hx    Allergies as of 03/24/2021       Reactions   Statins    Lipitor,simvatatin, pravastatin.welchol cause myalgia        Medication List        Accurate as of March 24, 2021  5:52 PM. If you have any questions, ask your nurse or doctor.          STOP taking these medications    insulin detemir 100 UNIT/ML FlexPen Commonly known as: LEVEMIR Stopped by: [name], DO       TAKE these medications    albuterol 108 (90 Base) MCG/ACT inhaler Commonly known as: VENTOLIN HFA Inhale 1-2 puffs into the lungs every 6 (six) hours as needed for wheezing or shortness of breath.   amLODipine 10 MG tablet Commonly known as: NORVASC Take 1 tablet (10 mg total) by mouth daily.   aspirin 81 MG chewable tablet Chew by mouth daily.   calcium citrate-vitamin D 315-200 MG-UNIT tablet Commonly known as: CITRACAL+D Take 1 tablet by mouth daily.   CoQ10 100 MG Caps Take 1 tablet by mouth daily.   diclofenac Sodium 1 % Gel Commonly known as: Voltaren Apply topically 4 times daily PRN   DULoxetine 20 MG capsule Commonly known as: CYMBALTA Take 1 capsule (20 mg total) by mouth 2 (two) times daily.   FIBER-CAPS PO Take by mouth.   fluticasone 50 MCG/ACT nasal spray Commonly known as: FLONASE Place 2 sprays into both  nostrils daily.   glipiZIDE 10 MG tablet Commonly known as: GLUCOTROL Take 1 tablet (10 mg total) by mouth 2 (two) times daily before a meal.   Insulin Pen Needle 31G X 5 MM Misc Use as directed   Magnesium 400 MG Caps Take 1 tablet by mouth daily.   metFORMIN 1000 MG tablet Commonly known as: GLUCOPHAGE Take 1 tablet (  1,000 mg total) by mouth 2 (two) times daily with a meal.   metoprolol succinate 100 MG 24 hr tablet Commonly known as: TOPROL-XL Take 1 tablet (100 mg total) by mouth daily.   multivitamin capsule Take 1 capsule by mouth daily.   nitroGLYCERIN 0.4 MG SL tablet Commonly known as: NITROSTAT Place under the tongue.   Ozempic (0.25 or 0.5 MG/DOSE) 2 MG/1.5ML Sopn Generic drug: Semaglutide(0.25 or 0.5MG /DOS) Inject 0.5 mg into the skin once a week. Started by: [name], DO   rosuvastatin 20 MG tablet Commonly known as: CRESTOR Take 1 tablet (20 mg total) by mouth daily.   sitaGLIPtin 100 MG tablet Commonly known as: JANUVIA Take 1 tablet (100 mg total) by mouth daily.   TART CHERRY ADVANCED PO Take by mouth.   tiZANidine 4 MG tablet Commonly known as: ZANAFLEX Take 1 tablet (4 mg total) by mouth every 6 (six) hours as needed for muscle spasms. Started by: [name], DO   Toujeo SoloStar 300 UNIT/ML Solostar Pen Generic drug: insulin glargine (1 Unit Dial) Inject 40 Units into the skin 2 (two) times daily. Start taking on: April 10, 2021 Started by: [name], DO   Turmeric 500 MG Caps Take by mouth.   valsartan-hydrochlorothiazide 320-25 MG tablet Commonly known as: DIOVAN-HCT Take 1 tablet by mouth daily.   VITAMIN B12-FOLIC ACID PO Take 1 tablet by mouth daily.   vitamin C 100 MG tablet Take 1 tablet by mouth daily.   Vitamin D3 75 MCG (3000 UT) Tabs Take by mouth. 2,000 units daily   Vitamin K2 100 MCG Caps Take 1 tablet by mouth daily.   Zinc 25 MG Tabs Take 1 tablet by mouth daily.        All past medical  history, surgical history, allergies, family history, immunizations andmedications were updated in the EMR today and reviewed under the history and medication portions of their EMR.     ROS: Negative, with the exception of above mentioned in HPI   Objective:  BP 132/78    Pulse 63    Temp 98.5 F (36.9 C) (Oral)    Ht 5\' 10"  (1.778 m)    Wt 285 lb (129.3 kg)    SpO2 100%    BMI 40.89 kg/m  Body mass index is 40.89 kg/m. Physical Exam Vitals and nursing note reviewed.  Constitutional:      General: She is not in acute distress.    Appearance: Normal appearance. She is obese. She is not ill-appearing, toxic-appearing or diaphoretic.  HENT:     Head: Normocephalic and atraumatic.     Mouth/Throat:     Mouth: Mucous membranes are moist.  Eyes:     General: No scleral icterus.       Right eye: No discharge.        Left eye: No discharge.     Extraocular Movements: Extraocular movements intact.     Conjunctiva/sclera: Conjunctivae normal.     Pupils: Pupils are equal, round, and reactive to light.  Cardiovascular:     Rate and Rhythm: Normal rate and regular rhythm.  Pulmonary:     Effort: Pulmonary effort is normal. No respiratory distress.     Breath sounds: Normal breath sounds. No wheezing, rhonchi or rales.  Musculoskeletal:     Cervical back: Neck supple. No tenderness.  Lymphadenopathy:     Cervical: No cervical adenopathy.  Skin:    General: Skin is warm and dry.     Coloration: Skin is  not jaundiced or pale.     Findings: No erythema or rash.  Neurological:     Mental Status: She is alert and oriented to person, place, and time. Mental status is at baseline.     Motor: No weakness.     Gait: Gait normal.  Psychiatric:        Mood and Affect: Mood normal.        Behavior: Behavior normal.        Thought Content: Thought content normal.        Judgment: Judgment normal.      No results found. No results found. Results for orders placed or performed in visit on  03/24/21 (from the past 24 hour(s))  POCT HgB A1C     Status: Abnormal   Collection Time: 03/24/21  8:42 AM  Result Value Ref Range   Hemoglobin A1C 9.4 (A) 4.0 - 5.6 %   HbA1c POC (<> result, manual entry) 9.4 4.0 - 5.6 %   HbA1c, POC (prediabetic range) 9.4 (A) 5.7 - 6.4 %   HbA1c, POC (controlled diabetic range) 9.4 (A) 0.0 - 7.0 %     Assessment/Plan: Julie Osborne is a 70 y.o. female present for OV for  Hypertension, benign/HLD/coronary artery disease Stable.  Continue amlodipine 10 mg daily> advised her to start taking at night Continue valsartan/HCTZ 320-12.5.   Continue  metoprolol to 100 mg  - low sodium, exercise.  - routine exercise encouraged.  - f/u 4 mos [place]    Morbid obesity (HCC)/Other diabetic neurological complication associated with type 2 diabetes mellitus ([place]) Continues to elevate now uncontrolled. May need to consider novolog with meals.  Continue metformin 1000 mg twice daily Continue glipizide  10 mg twice daily Switch levemir to toujeo 40 u BID d/t formulary change (after 1/1- called in to fill after new year) Januvia 100 mg prescribed Start ozempic 0.5 weekly.  Tried: Rybelsus > pt reports it is too expensive did not start.  Continue  gabapentin (see below for dosage) PNA series: completed Flu shot: UTD 2022- provied today (recommneded yearly) Foot exam: Completed.  Also follows with orthopedics and podiatry.  Eye exam: 11/2020- Ophth on [place]. Dr. Dennie Fetters A1c: 7.9--> 7.5 --> 6.2 --> 7.6--> 6.4 --> 6.8 >> 7.6>>6.8>>7.9>8.2> 8.1> 9.6> 9.8>9.4 today Microallb: On ARB Referral to endocrinology placed in Sept> she has not established> will check into why this referral was not completed. Pt provided with contact info.   Lumbar back pain with radiculopathy affecting lower extremity/Spinal stenosis, lumbar region, without neurogenic claudication/anxiety Stable.  Continue Cymbalta to 40 mg daily Zanaflex prescribed flares.  - Ambulatory referral to  Psychology placed 03/2020  Return in about 15 weeks (around 07/07/2021) for [place] (30 min).   Reviewed expectations re: course of current medical issues. Discussed self-management of symptoms. Outlined signs and symptoms indicating need for more acute intervention. Patient verbalized understanding and all questions were answered. Patient received an After-Visit Summary.    Orders Placed This Encounter  Procedures   POCT HgB A1C   Meds ordered this encounter  Medications   Semaglutide,0.25 or 0.5MG /DOS, (OZEMPIC, 0.25 OR 0.5 MG/DOSE,) 2 MG/1.5ML SOPN    Sig: Inject 0.5 mg into the skin once a week.    Dispense:  1.5 mL    Refill:  2   valsartan-hydrochlorothiazide (DIOVAN-HCT) 320-25 MG tablet    Sig: Take 1 tablet by mouth daily.    Dispense:  90 tablet    Refill:  1   rosuvastatin (CRESTOR) 20  MG tablet    Sig: Take 1 tablet (20 mg total) by mouth daily.    Dispense:  90 tablet    Refill:  3   metoprolol succinate (TOPROL-XL) 100 MG 24 hr tablet    Sig: Take 1 tablet (100 mg total) by mouth daily.    Dispense:  90 tablet    Refill:  1   metFORMIN (GLUCOPHAGE) 1000 MG tablet    Sig: Take 1 tablet (1,000 mg total) by mouth 2 (two) times daily with a meal.    Dispense:  180 tablet    Refill:  1   glipiZIDE (GLUCOTROL) 10 MG tablet    Sig: Take 1 tablet (10 mg total) by mouth 2 (two) times daily before a meal.    Dispense:  180 tablet    Refill:  1   fluticasone (FLONASE) 50 MCG/ACT nasal spray    Sig: Place 2 sprays into both nostrils daily.    Dispense:  16 g    Refill:  11   DULoxetine (CYMBALTA) 20 MG capsule    Sig: Take 1 capsule (20 mg total) by mouth 2 (two) times daily.    Dispense:  180 capsule    Refill:  1   amLODipine (NORVASC) 10 MG tablet    Sig: Take 1 tablet (10 mg total) by mouth daily.    Dispense:  90 tablet    Refill:  1    Please discontinue amlodipine/valsartan/HCTZ combo pill   tiZANidine (ZANAFLEX) 4 MG tablet    Sig: Take 1 tablet (4 mg  total) by mouth every 6 (six) hours as needed for muscle spasms.    Dispense:  30 tablet    Refill:  5   insulin glargine, 1 Unit Dial, (TOUJEO SOLOSTAR) 300 UNIT/ML Solostar Pen    Sig: Inject 40 Units into the skin 2 (two) times daily.    Dispense:  4.5 mL    Refill:  11    Please do not fill until after 04/09/2021- change in her formulary coverage. May dc levemir after 04/09/2021    Referral Orders  No referral(s) requested today      Note is dictated utilizing voice recognition software. Although note has been proof read prior to signing, occasional typographical errors still can be missed. If any questions arise, please do not hesitate to call for verification.   electronically signed by:  [name], DO  [place]

## 2021-04-20 ENCOUNTER — Encounter: Payer: Self-pay | Admitting: Family Medicine

## 2021-04-21 NOTE — Telephone Encounter (Signed)
PA fax received from pharmacy.

## 2021-04-24 ENCOUNTER — Ambulatory Visit: Payer: Medicare Other | Admitting: Family Medicine

## 2021-05-01 NOTE — Telephone Encounter (Signed)
Message from [facility] [employer] does not handle this review. Please visit [email or web address] to start a prior authorization or fax information to [phone]. Please include all supporting chart notes. You may contact [employer] at [phone].

## 2021-05-02 NOTE — Telephone Encounter (Signed)
Attempted to call number office open 8-6 CT

## 2021-05-03 ENCOUNTER — Other Ambulatory Visit: Payer: Self-pay

## 2021-06-07 ENCOUNTER — Ambulatory Visit: Payer: No Typology Code available for payment source | Admitting: Podiatry

## 2021-06-08 ENCOUNTER — Ambulatory Visit: Payer: Medicare Other | Admitting: Podiatry

## 2021-06-21 ENCOUNTER — Telehealth: Payer: Self-pay

## 2021-06-21 NOTE — Telephone Encounter (Signed)
Fax received for pt to continue receiving coverage for FreeStyle Libre 2. Records printed and attached to form and placed on PCP desk.  ?

## 2021-06-21 NOTE — Telephone Encounter (Signed)
Received. Does not seem there is a need for me to sign or review? Request is for the records printed only? ?Placed back in cma work basket ?

## 2021-06-22 NOTE — Telephone Encounter (Signed)
faxed

## 2021-06-28 ENCOUNTER — Encounter: Payer: Self-pay | Admitting: Family Medicine

## 2021-06-29 ENCOUNTER — Other Ambulatory Visit: Payer: Self-pay

## 2021-06-29 MED ORDER — INSULIN PEN NEEDLE 31G X 5 MM MISC: 3 refills | Status: DC

## 2021-07-03 ENCOUNTER — Ambulatory Visit: Payer: Medicare Other | Admitting: Podiatry

## 2021-07-06 ENCOUNTER — Ambulatory Visit (INDEPENDENT_AMBULATORY_CARE_PROVIDER_SITE_OTHER): Payer: Medicare Other | Admitting: Family Medicine

## 2021-07-06 ENCOUNTER — Encounter: Payer: Self-pay | Admitting: Family Medicine

## 2021-07-06 VITALS — BP 128/74 | HR 74 | Temp 97.4°F | Ht 70.0 in | Wt 282.0 lb

## 2021-07-06 DIAGNOSIS — G4733 Obstructive sleep apnea (adult) (pediatric): Secondary | ICD-10-CM

## 2021-07-06 DIAGNOSIS — M5416 Radiculopathy, lumbar region: Secondary | ICD-10-CM

## 2021-07-06 DIAGNOSIS — M48061 Spinal stenosis, lumbar region without neurogenic claudication: Secondary | ICD-10-CM

## 2021-07-06 DIAGNOSIS — I10 Essential (primary) hypertension: Secondary | ICD-10-CM

## 2021-07-06 DIAGNOSIS — F418 Other specified anxiety disorders: Secondary | ICD-10-CM

## 2021-07-06 DIAGNOSIS — I2510 Atherosclerotic heart disease of native coronary artery without angina pectoris: Secondary | ICD-10-CM

## 2021-07-06 DIAGNOSIS — E1165 Type 2 diabetes mellitus with hyperglycemia: Secondary | ICD-10-CM | POA: Diagnosis not present

## 2021-07-06 DIAGNOSIS — E782 Mixed hyperlipidemia: Secondary | ICD-10-CM

## 2021-07-06 DIAGNOSIS — Z9861 Coronary angioplasty status: Secondary | ICD-10-CM

## 2021-07-06 LAB — POCT GLYCOSYLATED HEMOGLOBIN (HGB A1C)
HbA1c POC (<> result, manual entry): 9.2 % (ref 4.0–5.6)
HbA1c, POC (controlled diabetic range): 9.2 % — AB (ref 0.0–7.0)
HbA1c, POC (prediabetic range): 9.2 % — AB (ref 5.7–6.4)
Hemoglobin A1C: 9.2 % — AB (ref 4.0–5.6)

## 2021-07-06 LAB — MICROALBUMIN / CREATININE URINE RATIO
Creatinine,U: 127.7 mg/dL
Microalb Creat Ratio: 3.8 mg/g (ref 0.0–30.0)
Microalb, Ur: 4.9 mg/dL — ABNORMAL HIGH (ref 0.0–1.9)

## 2021-07-06 MED ORDER — TIZANIDINE HCL 4 MG PO TABS: 4.0000 mg | ORAL_TABLET | Freq: Four times a day (QID) | ORAL | 5 refills | Status: DC | PRN

## 2021-07-06 MED ORDER — DULOXETINE HCL 20 MG PO CPEP: 20.0000 mg | ORAL_CAPSULE | Freq: Every day | ORAL | 1 refills | Status: DC

## 2021-07-06 MED ORDER — VALSARTAN-HYDROCHLOROTHIAZIDE 320-25 MG PO TABS: 1.0000 | ORAL_TABLET | Freq: Every day | ORAL | 1 refills | Status: DC

## 2021-07-06 MED ORDER — GLIPIZIDE 10 MG PO TABS: 10.0000 mg | ORAL_TABLET | Freq: Two times a day (BID) | ORAL | 1 refills | Status: DC

## 2021-07-06 MED ORDER — METOPROLOL SUCCINATE ER 100 MG PO TB24: 100.0000 mg | ORAL_TABLET | Freq: Every day | ORAL | 1 refills | Status: DC

## 2021-07-06 MED ORDER — METFORMIN HCL 1000 MG PO TABS: 1000.0000 mg | ORAL_TABLET | Freq: Two times a day (BID) | ORAL | 1 refills | Status: DC

## 2021-07-06 MED ORDER — AMLODIPINE BESYLATE 10 MG PO TABS: 10.0000 mg | ORAL_TABLET | Freq: Every day | ORAL | 1 refills | Status: DC

## 2021-07-06 NOTE — Patient Instructions (Signed)
? ?  Great to see you today.  ?I have refilled the medication(s) we provide.  ? ?If labs were collected, we will inform you of lab results once received either by echart message or telephone call.  ? - echart message- for normal results that have been seen by the patient already.  ? - telephone call: abnormal results or if patient has not viewed results in their echart. ? ?Return in about 24 weeks (around 12/21/2021) for Routine chronic condition follow-up. We will collect fasting labs that appt also.  ? ?

## 2021-07-06 NOTE — Progress Notes (Signed)
? ? ? ?This visit occurred during the SARS-CoV-2 public health emergency.  Safety protocols were in place, including screening questions prior to the visit, additional usage of staff PPE, and extensive cleaning of exam room while observing appropriate contact time as indicated for disinfecting solutions.  ? ? ?Julie Osborne , 05-13-1950, 71 y.o., female ?MRN: [id number] ?Patient Care Team  ?  Relationship Specialty Notifications Start End  ?Ma Hillock, DO PCP - General Family Medicine  01/13/16   ?Leda Roys, [place] Referring Physician Podiatry  12/24/19   ?Newt Minion, MD Consulting Physician Orthopedic Surgery  08/16/20   ? ? ?Chief Complaint  ?Patient presents with  ? Diabetes  ?  [facility]; pt is fasting  ? ?  ?Subjective:Julie Osborne is a 71 y.o. female patient present for ?Diabetes/morbid obesity:  ?Pt reports compliance with metformin 1000 mg BID, glipizide 10 mg BID,  and Toujeo 80 divided dose (BID). Ozempic 0.5 mg weekly. She does have neuropathy of toes and hands on occasions. She is compliant with  gabapentin 300/400.  She has follow-up with Dr. Sharol Given and podiatry for care of her feet.  She reports her fasting sugars have been in the 190s she is now established with endocrinology ?Onset: 2005 ? ?HTN/hyperlipidemia: ?Pt reports compliance with  amlodipine 10, valsartan-hctz 10-320-25, metoprolol 100 mg daily, Crestor and ASA 81 today.Patient denies chest pain, shortness of breath, dizziness or lower extremity edema.  ?Anxiety: ?Patient reports she feels better.  She reports after her mother-in-law moved to [place] she lowered her Cymbalta dose from 40 mg to 20 mg daily and feels it is working well.   ?Prior note: ?Pt reports since her MIL moved in with them a few weeks ago she finds herself not able to control her emotions. She is having panic attacks in the middle of the night. She feels anxious and has not been able to find a way to cope with the changes. She states her MIL has some cognitive  decline and is unable to live on her own. [name] reports she has always had difficulty getting along with her MIL, but it is more difficult now since she is living with her.  ? ? ? ?  03/24/2021  ?  8:27 AM 08/16/2020  ?  8:45 AM 03/30/2020  ?  9:31 AM 02/24/2020  ?  9:19 AM 02/26/2018  ?  8:10 AM  ?Depression screen PHQ 2/9  ?Decreased Interest 0 0 0 0 0  ?Down, Depressed, Hopeless 0 1 1 0 0  ?PHQ - 2 Score 0 1 1 0 0  ?Altered sleeping  0 3    ?Tired, decreased energy  1 3    ?Change in appetite  1 3    ?Feeling bad or failure about yourself   0 3    ?Trouble concentrating  0 3    ?Moving slowly or fidgety/restless  0 0    ?Suicidal thoughts  0 0    ?PHQ-9 Score  3 16    ? ? ?Allergies  ?Allergen Reactions  ? Statins   ?  Lipitor,simvatatin, pravastatin.welchol cause myalgia  ? ?Social History  ? ?Social History Narrative  ? Married to [employer], 2 children.   ? BS, RN retired now Printmaker.   ? Drinks caffeine, uses herbal remedies. Daily vitmain use.  ? Wears her seatbelt, smoke detector in the home.   ? exercises routinely.   ? She feels safe in her relationships.   ? ?Past Medical History:  ?Diagnosis  Date  ? Apnea   ? Closed fracture of neck of left radius 10/14/2017  ? Concussion   ? Degenerative arthritis   ? generalized/back/LEFT foot  ? Diabetic neuropathy ([place]) 2017  ? Diverticulosis 06/19/2013  ? sigmoid colon on colonoscopy  ? Hyperlipidemia 2000  ? on meds  ? Hypertension, benign 1996  ? on meds  ? Lumbar radiculopathy   ? Severe L4/L5 radiculopathy by old records.   ? Pes planus 12/17/2011  ? Tenosynovitis of ankle 2017  ? post tibial tendon dysfunction stage 5 (PTTD)  ? Uncontrolled diabetes mellitus 2005  ? on meds  ? ?Past Surgical History:  ?Procedure Laterality Date  ? CORONARY ANGIOPLASTY WITH STENT PLACEMENT  10/17/2018  ? DES x2/LAD  ? EXPLORATORY LAPAROTOMY  1976  ? endometriosis  ? MINOR HEMORRHOIDECTOMY  2006  ? OTHER SURGICAL HISTORY Right 1975  ? venous ligation  ? ?Family History  ?Problem Relation Age  of Onset  ? COPD Mother   ? Diabetes Mellitus II Mother   ? Heart failure Mother   ? Osteoarthritis Father   ? Cancer Father   ?     tumor found behind xyphoid process  ? Diabetes Sister   ? Diabetes Brother   ? Dementia Maternal Grandmother   ? Heart disease Maternal Grandmother   ? Heart disease Maternal Grandfather   ? Stroke Maternal Grandfather   ? Heart failure Maternal Aunt   ? Lung cancer Paternal Grandfather   ? Lung cancer Paternal Aunt   ? Colon polyps Neg Hx   ? Colon cancer Neg Hx   ? Esophageal cancer Neg Hx   ? Stomach cancer Neg Hx   ? Rectal cancer Neg Hx   ? ?Allergies as of 07/06/2021   ? ?   Reactions  ? Statins   ? Lipitor,simvatatin, pravastatin.welchol cause myalgia  ? ?  ? ?  ?Medication List  ?  ? ?  ? Accurate as of July 06, 2021  4:29 PM. If you have any questions, ask your nurse or doctor.  ?  ?  ? ?  ? ?STOP taking these medications   ? ?sitaGLIPtin 100 MG tablet ?Commonly known as: JANUVIA ?Stopped by: [name], DO ?  ? ?  ? ?TAKE these medications   ? ?albuterol 108 (90 Base) MCG/ACT inhaler ?Commonly known as: VENTOLIN HFA ?Inhale 1-2 puffs into the lungs every 6 (six) hours as needed for wheezing or shortness of breath. ?  ?amLODipine 10 MG tablet ?Commonly known as: NORVASC ?Take 1 tablet (10 mg total) by mouth daily. ?  ?aspirin 81 MG chewable tablet ?Chew by mouth daily. ?  ?calcium citrate-vitamin D 315-200 MG-UNIT tablet ?Commonly known as: CITRACAL+D ?Take 1 tablet by mouth daily. ?  ?CoQ10 100 MG Caps ?Take 1 tablet by mouth daily. ?  ?diclofenac Sodium 1 % Gel ?Commonly known as: Voltaren ?Apply topically 4 times daily PRN ?  ?DULoxetine 20 MG capsule ?Commonly known as: CYMBALTA ?Take 1 capsule (20 mg total) by mouth daily. ?What changed: when to take this ?Changed by: [name], DO ?  ?FIBER-CAPS PO ?Take by mouth. ?  ?fluticasone 50 MCG/ACT nasal spray ?Commonly known as: FLONASE ?Place 2 sprays into both nostrils daily. ?  ?glipiZIDE 10 MG tablet ?Commonly known  as: GLUCOTROL ?Take 1 tablet (10 mg total) by mouth 2 (two) times daily before a meal. ?  ?Insulin Pen Needle 31G X 5 MM Misc ?Use as directed ?  ?Magnesium 400 MG Caps ?  Take 1 tablet by mouth daily. ?  ?metFORMIN 1000 MG tablet ?Commonly known as: GLUCOPHAGE ?Take 1 tablet (1,000 mg total) by mouth 2 (two) times daily with a meal. ?  ?metoprolol succinate 100 MG 24 hr tablet ?Commonly known as: TOPROL-XL ?Take 1 tablet (100 mg total) by mouth daily. ?  ?multivitamin capsule ?Take 1 capsule by mouth daily. ?  ?nitroGLYCERIN 0.4 MG SL tablet ?Commonly known as: NITROSTAT ?Place under the tongue. ?  ?Ozempic (0.25 or 0.5 MG/DOSE) 2 MG/1.5ML Sopn ?Generic drug: Semaglutide(0.25 or 0.'5MG'$ /DOS) ?Inject 0.5 mg into the skin once a week. ?  ?rosuvastatin 20 MG tablet ?Commonly known as: CRESTOR ?Take 1 tablet (20 mg total) by mouth daily. ?  ?TART CHERRY ADVANCED PO ?Take by mouth. ?  ?tiZANidine 4 MG tablet ?Commonly known as: ZANAFLEX ?Take 1 tablet (4 mg total) by mouth every 6 (six) hours as needed for muscle spasms. ?  ?Toujeo SoloStar 300 UNIT/ML Solostar Pen ?Generic drug: insulin glargine (1 Unit Dial) ?Inject 40 Units into the skin 2 (two) times daily. ?  ?Turmeric 500 MG Caps ?Take by mouth. ?  ?valsartan-hydrochlorothiazide 320-25 MG tablet ?Commonly known as: DIOVAN-HCT ?Take 1 tablet by mouth daily. ?  ?VITAMIN B12-FOLIC ACID PO ?Take 1 tablet by mouth daily. ?  ?vitamin C 100 MG tablet ?Take 1 tablet by mouth daily. ?  ?Vitamin D3 75 MCG (3000 UT) Tabs ?Take by mouth. 2,000 units daily ?  ?Vitamin K2 100 MCG Caps ?Take 1 tablet by mouth daily. ?  ?Zinc 25 MG Tabs ?Take 1 tablet by mouth daily. ?  ? ?  ? ? ?All past medical history, surgical history, allergies, family history, immunizations andmedications were updated in the EMR today and reviewed under the history and medication portions of their EMR.    ? ?ROS: Negative, with the exception of above mentioned in HPI ? ?Objective:  ?BP 128/74   Pulse 74    Temp (!) 97.4 ?F (36.3 ?C) (Oral)   Ht '5\' 10"'$  (1.778 m)   Wt 282 lb (127.9 kg)   SpO2 100%   BMI 40.46 kg/m?  ?Body mass index is 40.46 kg/m?Marland Kitchen ?Physical Exam ?Vitals and nursing note reviewed.  ?Constitutional

## 2021-07-08 ENCOUNTER — Ambulatory Visit (INDEPENDENT_AMBULATORY_CARE_PROVIDER_SITE_OTHER): Payer: Medicare Other

## 2021-07-08 DIAGNOSIS — Z0000 Encounter for general adult medical examination without abnormal findings: Secondary | ICD-10-CM

## 2021-07-08 DIAGNOSIS — Z780 Asymptomatic menopausal state: Secondary | ICD-10-CM

## 2021-07-08 DIAGNOSIS — Z1231 Encounter for screening mammogram for malignant neoplasm of breast: Secondary | ICD-10-CM

## 2021-07-08 DIAGNOSIS — Z13820 Encounter for screening for osteoporosis: Secondary | ICD-10-CM

## 2021-07-08 NOTE — Progress Notes (Signed)
? ?Subjective:  ? Julie Osborne is a 71 y.o. female who presents for Medicare Annual (Subsequent) preventive examination. I connected with  Anthea Udovich on 07/08/21 by a audio enabled telemedicine application and verified that I am speaking with the correct person using two identifiers. ? ?Patient Location: Home ? ?Provider Location: Home Office ? ?I discussed the limitations of evaluation and management by telemedicine. The patient expressed understanding and agreed to proceed.  ? ?Review of Systems    ?Defer to PCP ?Cardiac Risk Factors include: advanced age (>66mn, >>85women);diabetes mellitus;dyslipidemia;family history of premature cardiovascular disease;hypertension;obesity (BMI >30kg/m2) ? ?   ?Objective:  ?  ?There were no vitals filed for this visit. ?There is no height or weight on file to calculate BMI. ? ? ?  07/08/2021  ?  9:42 AM 03/15/2020  ?  2:25 PM 02/24/2020  ?  9:15 AM 06/07/2016  ?  5:14 PM  ?Advanced Directives  ?Does Patient Have a Medical Advance Directive? No Yes No No  ?Type of Advance Directive  Living will    ?Would patient like information on creating a medical advance directive? Yes (MAU/Ambulatory/Procedural Areas - Information given)  Yes (MAU/Ambulatory/Procedural Areas - Information given) Yes (ED - Information included in AVS)  ? ? ?Current Medications (verified) ?Outpatient Encounter Medications as of 07/08/2021  ?Medication Sig  ? albuterol (VENTOLIN HFA) 108 (90 Base) MCG/ACT inhaler Inhale 1-2 puffs into the lungs every 6 (six) hours as needed for wheezing or shortness of breath.  ? amLODipine (NORVASC) 10 MG tablet Take 1 tablet (10 mg total) by mouth daily.  ? Ascorbic Acid (VITAMIN C) 100 MG tablet Take 1 tablet by mouth daily.   ? aspirin 81 MG chewable tablet Chew by mouth daily.   ? calcium citrate-vitamin D (CITRACAL+D) 315-200 MG-UNIT tablet Take 1 tablet by mouth daily.   ? Calcium Polycarbophil (FIBER-CAPS PO) Take by mouth.   ? Cholecalciferol (VITAMIN D3) 75 MCG (3000  UT) TABS Take by mouth. 2,000 units daily  ? Cobalamin Combinations (VITAMIN B12-FOLIC ACID PO) Take 1 tablet by mouth daily.   ? Coenzyme Q10 (COQ10) 100 MG CAPS Take 1 tablet by mouth daily.   ? diclofenac Sodium (VOLTAREN) 1 % GEL Apply topically 4 times daily PRN  ? DULoxetine (CYMBALTA) 20 MG capsule Take 1 capsule (20 mg total) by mouth daily.  ? fluticasone (FLONASE) 50 MCG/ACT nasal spray Place 2 sprays into both nostrils daily.  ? glipiZIDE (GLUCOTROL) 10 MG tablet Take 1 tablet (10 mg total) by mouth 2 (two) times daily before a meal.  ? insulin glargine, 1 Unit Dial, (TOUJEO SOLOSTAR) 300 UNIT/ML Solostar Pen Inject 40 Units into the skin 2 (two) times daily.  ? Insulin Pen Needle 31G X 5 MM MISC Use as directed  ? Magnesium 400 MG CAPS Take 1 tablet by mouth daily.   ? Menaquinone-7 (VITAMIN K2) 100 MCG CAPS Take 1 tablet by mouth daily.   ? metFORMIN (GLUCOPHAGE) 1000 MG tablet Take 1 tablet (1,000 mg total) by mouth 2 (two) times daily with a meal.  ? metoprolol succinate (TOPROL-XL) 100 MG 24 hr tablet Take 1 tablet (100 mg total) by mouth daily.  ? Misc Natural Products (TART CHERRY ADVANCED PO) Take by mouth.   ? Multiple Vitamin (MULTIVITAMIN) capsule Take 1 capsule by mouth daily.   ? nitroGLYCERIN (NITROSTAT) 0.4 MG SL tablet Place under the tongue.   ? rosuvastatin (CRESTOR) 20 MG tablet Take 1 tablet (20 mg total) by mouth daily.  ?  Semaglutide,0.25 or 0.'5MG'$ /DOS, (OZEMPIC, 0.25 OR 0.5 MG/DOSE,) 2 MG/1.5ML SOPN Inject 0.5 mg into the skin once a week.  ? tiZANidine (ZANAFLEX) 4 MG tablet Take 1 tablet (4 mg total) by mouth every 6 (six) hours as needed for muscle spasms.  ? Turmeric 500 MG CAPS Take by mouth.   ? valsartan-hydrochlorothiazide (DIOVAN-HCT) 320-25 MG tablet Take 1 tablet by mouth daily.  ? Zinc 25 MG TABS Take 1 tablet by mouth daily.   ? ?Facility-Administered Encounter Medications as of 07/08/2021  ?Medication  ? 0.9 %  sodium chloride infusion  ? ? ?Allergies (verified) ?Statins   ? ?History: ?Past Medical History:  ?Diagnosis Date  ? Apnea   ? Closed fracture of neck of left radius 10/14/2017  ? Concussion   ? Degenerative arthritis   ? generalized/back/LEFT foot  ? Diabetic neuropathy ([place]) 2017  ? Diverticulosis 06/19/2013  ? sigmoid colon on colonoscopy  ? Hyperlipidemia 2000  ? on meds  ? Hypertension, benign 1996  ? on meds  ? Lumbar radiculopathy   ? Severe L4/L5 radiculopathy by old records.   ? Pes planus 12/17/2011  ? Tenosynovitis of ankle 2017  ? post tibial tendon dysfunction stage 5 (PTTD)  ? Uncontrolled diabetes mellitus 2005  ? on meds  ? ?Past Surgical History:  ?Procedure Laterality Date  ? CORONARY ANGIOPLASTY WITH STENT PLACEMENT  10/17/2018  ? DES x2/LAD  ? EXPLORATORY LAPAROTOMY  1976  ? endometriosis  ? MINOR HEMORRHOIDECTOMY  2006  ? OTHER SURGICAL HISTORY Right 1975  ? venous ligation  ? ?Family History  ?Problem Relation Age of Onset  ? COPD Mother   ? Diabetes Mellitus II Mother   ? Heart failure Mother   ? Osteoarthritis Father   ? Cancer Father   ?     tumor found behind xyphoid process  ? Diabetes Sister   ? Diabetes Brother   ? Dementia Maternal Grandmother   ? Heart disease Maternal Grandmother   ? Heart disease Maternal Grandfather   ? Stroke Maternal Grandfather   ? Heart failure Maternal Aunt   ? Lung cancer Paternal Grandfather   ? Lung cancer Paternal Aunt   ? Colon polyps Neg Hx   ? Colon cancer Neg Hx   ? Esophageal cancer Neg Hx   ? Stomach cancer Neg Hx   ? Rectal cancer Neg Hx   ? ?Social History  ? ?Socioeconomic History  ? Marital status: Married  ?  Spouse name: Octavia Bruckner  ? Number of children: 2  ? Years of education: 83  ? Highest education level: Not on file  ?Occupational History  ? Occupation: Pharmacist, hospital  ? Occupation: retired Marine scientist  ?Tobacco Use  ? Smoking status: Former  ?  Types: Cigarettes  ?  Quit date: 04/10/2011  ?  Years since quitting: 10.2  ? Smokeless tobacco: Never  ?Vaping Use  ? Vaping Use: Never used  ?Substance and Sexual Activity  ?  Alcohol use: No  ? Drug use: No  ? Sexual activity: Yes  ?  Partners: Male  ?  Birth control/protection: None  ?  Comment: Married  ?Other Topics Concern  ? Not on file  ?Social History Narrative  ? Married to [employer], 2 children.   ? BS, RN retired now Printmaker.   ? Drinks caffeine, uses herbal remedies. Daily vitmain use.  ? Wears her seatbelt, smoke detector in the home.   ? exercises routinely.   ? She feels safe in her relationships.   ? ?  Social Determinants of Health  ? ?Financial Resource Strain: Low Risk   ? Difficulty of Paying Living Expenses: Not hard at all  ?Food Insecurity: No Food Insecurity  ? Worried About Charity fundraiser in the Last Year: Never true  ? Ran Out of Food in the Last Year: Never true  ?Transportation Needs: No Transportation Needs  ? Lack of Transportation (Medical): No  ? Lack of Transportation (Non-Medical): No  ?Physical Activity: Sufficiently Active  ? Days of Exercise per Week: 7 days  ? Minutes of Exercise per Session: 30 min  ?Stress: No Stress Concern Present  ? Feeling of Stress : Only a little  ?Social Connections: Moderately Integrated  ? Frequency of Communication with Friends and Family: More than three times a week  ? Frequency of Social Gatherings with Friends and Family: Once a week  ? Attends Religious Services: More than 4 times per year  ? Active Member of Clubs or Organizations: No  ? Attends Archivist Meetings: Never  ? Marital Status: Married  ? ? ?Tobacco Counseling ?Counseling given: Not Answered ? ? ?Clinical Intake: ? ?  ? ?  ? ?  ? ?  ? ?  ? ?Diabetic?Yes ? ?  ? ?  ? ? ?Activities of Daily Living ? ?  07/08/2021  ?  9:33 AM  ?In your present state of health, do you have any difficulty performing the following activities:  ?Hearing? 0  ?Vision? 0  ?Preparing Food and eating ? N  ?Using the Toilet? N  ?In the past six months, have you accidently leaked urine? Y  ?Do you have problems with loss of bowel control? N  ?Managing your Medications? N   ?Managing your Finances? N  ?Housekeeping or managing your Housekeeping? N  ? ? ?Patient Care Team: ?Ma Hillock, DO as PCP - General (Family Medicine) ?Blazek, Gatha Mayer, DPM as Referring Physician ([facility]ia

## 2021-07-20 ENCOUNTER — Encounter: Payer: Self-pay | Admitting: Family Medicine

## 2021-07-20 NOTE — Telephone Encounter (Signed)
No further action required

## 2021-07-20 NOTE — Telephone Encounter (Signed)
Is this under cardiology? ?

## 2021-08-04 ENCOUNTER — Ambulatory Visit: Payer: Medicare Other

## 2021-08-04 ENCOUNTER — Other Ambulatory Visit: Payer: Medicare Other

## 2021-08-18 ENCOUNTER — Ambulatory Visit: Payer: Medicare Other

## 2021-09-15 ENCOUNTER — Ambulatory Visit
Admission: RE | Admit: 2021-09-15 | Discharge: 2021-09-15 | Disposition: A | Discharge status: Home / Self Care | Payer: PRIVATE HEALTH INSURANCE | Source: Ambulatory Visit | Attending: Family Medicine | Admitting: Family Medicine

## 2021-09-15 DIAGNOSIS — Z1231 Encounter for screening mammogram for malignant neoplasm of breast: Secondary | ICD-10-CM

## 2021-09-28 ENCOUNTER — Encounter: Payer: Self-pay | Admitting: Family Medicine

## 2021-09-29 ENCOUNTER — Other Ambulatory Visit: Payer: Self-pay

## 2021-09-29 MED ORDER — TOUJEO SOLOSTAR 300 UNIT/ML ~~LOC~~ SOPN: 40.0000 [IU] | PEN_INJECTOR | Freq: Two times a day (BID) | SUBCUTANEOUS | 0 refills | Status: DC

## 2021-09-29 NOTE — Telephone Encounter (Signed)
Pharmacy confirmed no refills. Sent 1 refill at this time

## 2021-10-03 LAB — COMPREHENSIVE METABOLIC PANEL: eGFR: 90

## 2021-11-13 ENCOUNTER — Other Ambulatory Visit: Payer: Self-pay

## 2021-11-13 MED ORDER — DULOXETINE HCL 20 MG PO CPEP: 20.0000 mg | ORAL_CAPSULE | Freq: Every day | ORAL | 0 refills | Status: DC

## 2021-12-22 ENCOUNTER — Ambulatory Visit (INDEPENDENT_AMBULATORY_CARE_PROVIDER_SITE_OTHER): Payer: PRIVATE HEALTH INSURANCE | Admitting: Family Medicine

## 2021-12-22 ENCOUNTER — Encounter: Payer: Self-pay | Admitting: Family Medicine

## 2021-12-22 DIAGNOSIS — U071 COVID-19: Secondary | ICD-10-CM

## 2021-12-22 MED ORDER — CEFDINIR 300 MG PO CAPS: 300.0000 mg | ORAL_CAPSULE | Freq: Two times a day (BID) | ORAL | 0 refills | Status: DC

## 2021-12-22 MED ORDER — BENZONATATE 200 MG PO CAPS: 200.0000 mg | ORAL_CAPSULE | Freq: Two times a day (BID) | ORAL | 0 refills | Status: DC | PRN

## 2021-12-22 MED ORDER — MOLNUPIRAVIR EUA 200MG CAPSULE: 4.0000 | ORAL_CAPSULE | Freq: Two times a day (BID) | ORAL | 0 refills | Status: AC

## 2021-12-22 NOTE — Progress Notes (Signed)
VIRTUAL VISIT VIA VIDEO  I connected with Julie Osborne on 12/22/21 at  8:20 AM EDT by a video enabled telemedicine application and verified that I am speaking with the correct person using two identifiers. Location patient: Home Location provider: [facility], Office Persons participating in the virtual visit: Patient, Dr. Raoul Pitch and Cyndra Numbers, CMA  I discussed the limitations of evaluation and management by telemedicine and the availability of in person appointments. The patient expressed understanding and agreed to proceed.     Colbi Schiltz , 1950-11-30, 71 y.o., female MRN: [id number] Patient Care Team    Relationship Specialty Notifications Start End  Ma Hillock, DO PCP - General Family Medicine  01/13/16   Leda Roys, [place] Referring Physician Podiatry  12/24/19   Newt Minion, MD Consulting Physician Orthopedic Surgery  08/16/20   Dietels, Lourdes Sledge, OD  Optometry  07/08/21     Chief Complaint  Patient presents with   Covid Positive    Pt c/o nasal congestion, nasal drainage, cough, SOB, sore throat, HA, body aches x 4 days; pt tested pos last night for covid     Subjective:Julie Osborne is a 71 y.o. female patient present for Covid + illness. Julie Osborne recently returned from a cruise and symptoms started shortly after. She denies shortness of breath, but is having to use her albuterol BID. She tested positive last night for covid. Sx onset 4 days. She has had vaccines.       07/08/2021    9:41 AM 03/24/2021    8:27 AM 08/16/2020    8:45 AM 03/30/2020    9:31 AM 02/24/2020    9:19 AM  Depression screen PHQ 2/9  Decreased Interest 0 0 0 0 0  Down, Depressed, Hopeless 0 0 1 1 0  PHQ - 2 Score 0 0 1 1 0  Altered sleeping   0 3   Tired, decreased energy   1 3   Change in appetite   1 3   Feeling bad or failure about yourself    0 3   Trouble concentrating   0 3   Moving slowly or fidgety/restless   0 0   Suicidal thoughts   0 0   PHQ-9 Score   3 16      Allergies  Allergen Reactions   Statins     Lipitor,simvatatin, pravastatin.welchol cause myalgia   Social History   Social History Narrative   Married to [employer], 2 children.    BS, RN retired now Printmaker.    Drinks caffeine, uses herbal remedies. Daily vitmain use.   Wears her seatbelt, smoke detector in the home.    exercises routinely.    She feels safe in her relationships.    Past Medical History:  Diagnosis Date   Apnea    Closed fracture of neck of left radius 10/14/2017   Concussion    Degenerative arthritis    generalized/back/LEFT foot   Diabetic neuropathy ([place]) 2017   Diverticulosis 06/19/2013   sigmoid colon on colonoscopy   Hyperlipidemia 2000   on meds   Hypertension, benign 1996   on meds   Lumbar radiculopathy    Severe L4/L5 radiculopathy by old records.    Pes planus 12/17/2011   Tenosynovitis of ankle 2017   post tibial tendon dysfunction stage 5 (PTTD)   Uncontrolled diabetes mellitus 2005   on meds   Past Surgical History:  Procedure Laterality Date   CORONARY ANGIOPLASTY WITH STENT  PLACEMENT  10/17/2018   DES x2/LAD   EXPLORATORY LAPAROTOMY  1976   endometriosis   MINOR HEMORRHOIDECTOMY  2006   OTHER SURGICAL HISTORY Right 1975   venous ligation   Family History  Problem Relation Age of Onset   COPD Mother    Diabetes Mellitus II Mother    Heart failure Mother    Osteoarthritis Father    Cancer Father        tumor found behind xyphoid process   Diabetes Sister    Diabetes Brother    Dementia Maternal Grandmother    Heart disease Maternal Grandmother    Heart disease Maternal Grandfather    Stroke Maternal Grandfather    Heart failure Maternal Aunt    Lung cancer Paternal Grandfather    Lung cancer Paternal Aunt    Colon polyps Neg Hx    Colon cancer Neg Hx    Esophageal cancer Neg Hx    Stomach cancer Neg Hx    Rectal cancer Neg Hx    Allergies as of 12/22/2021       Reactions   Statins    Lipitor,simvatatin,  pravastatin.welchol cause myalgia        Medication List        Accurate as of December 22, 2021  8:41 AM. If you have any questions, ask your nurse or doctor.          STOP taking these medications    Insulin Pen Needle 31G X 5 MM Misc Stopped by: [name], DO   Ozempic (0.25 or 0.5 MG/DOSE) 2 MG/1.5ML Sopn Generic drug: Semaglutide(0.25 or 0.'5MG'$ /DOS) Stopped by: [name], DO       TAKE these medications    albuterol 108 (90 Base) MCG/ACT inhaler Commonly known as: VENTOLIN HFA Inhale 1-2 puffs into the lungs every 6 (six) hours as needed for wheezing or shortness of breath.   amLODipine 10 MG tablet Commonly known as: NORVASC Take 1 tablet (10 mg total) by mouth daily.   aspirin 81 MG chewable tablet Chew by mouth daily.   benzonatate 200 MG capsule Commonly known as: TESSALON Take 1 capsule (200 mg total) by mouth 2 (two) times daily as needed for cough. Started by: [name], DO   calcium citrate-vitamin D 315-200 MG-UNIT tablet Commonly known as: CITRACAL+D Take 1 tablet by mouth daily.   cefdinir 300 MG capsule Commonly known as: OMNICEF Take 1 capsule (300 mg total) by mouth 2 (two) times daily. Started by: [name], DO   CoQ10 100 MG Caps Take 1 tablet by mouth daily.   diclofenac Sodium 1 % Gel Commonly known as: Voltaren Apply topically 4 times daily PRN   DULoxetine 20 MG capsule Commonly known as: CYMBALTA Take 1 capsule (20 mg total) by mouth daily.   FIBER-CAPS PO Take by mouth.   fluticasone 50 MCG/ACT nasal spray Commonly known as: FLONASE Place 2 sprays into both nostrils daily.   glipiZIDE 10 MG tablet Commonly known as: GLUCOTROL Take 1 tablet (10 mg total) by mouth 2 (two) times daily before a meal.   Magnesium 400 MG Caps Take 1 tablet by mouth daily.   metFORMIN 1000 MG tablet Commonly known as: GLUCOPHAGE Take 1 tablet (1,000 mg total) by mouth 2 (two) times daily with a meal.   metoprolol  succinate 100 MG 24 hr tablet Commonly known as: TOPROL-XL Take 1 tablet (100 mg total) by mouth daily.   molnupiravir EUA 200 mg Caps capsule Commonly known as: LAGEVRIO Take  4 capsules (800 mg total) by mouth 2 (two) times daily for 5 days. Started by: [name], DO   Mounjaro 7.5 MG/0.5ML Pen Generic drug: tirzepatide Inject into the skin.   multivitamin capsule Take 1 capsule by mouth daily.   nitroGLYCERIN 0.4 MG SL tablet Commonly known as: NITROSTAT Place under the tongue.   rosuvastatin 20 MG tablet Commonly known as: CRESTOR Take 1 tablet (20 mg total) by mouth daily.   TART CHERRY ADVANCED PO Take by mouth.   tiZANidine 4 MG tablet Commonly known as: ZANAFLEX Take 1 tablet (4 mg total) by mouth every 6 (six) hours as needed for muscle spasms.   Toujeo SoloStar 300 UNIT/ML Solostar Pen Generic drug: insulin glargine (1 Unit Dial) Inject 40 Units into the skin 2 (two) times daily.   Turmeric 500 MG Caps Take by mouth.   valsartan-hydrochlorothiazide 320-25 MG tablet Commonly known as: DIOVAN-HCT Take 1 tablet by mouth daily.   VITAMIN B12-FOLIC ACID PO Take 1 tablet by mouth daily.   vitamin C 100 MG tablet Take 1 tablet by mouth daily.   Vitamin D3 75 MCG (3000 UT) Tabs Take by mouth. 2,000 units daily   Vitamin K2 100 MCG Caps Take 1 tablet by mouth daily.   Zinc 25 MG Tabs Take 1 tablet by mouth daily.        All past medical history, surgical history, allergies, family history, immunizations andmedications were updated in the EMR today and reviewed under the history and medication portions of their EMR.    Review of Systems  Constitutional:  Positive for fever and malaise/fatigue. Negative for chills.  HENT:  Positive for congestion, sinus pain and sore throat. Negative for ear discharge, ear pain, nosebleeds and tinnitus.   Respiratory:  Positive for cough and sputum production.   Gastrointestinal:  Negative for abdominal pain,  diarrhea, nausea and vomiting.  Skin:  Negative for rash.  Neurological:  Positive for headaches. Negative for dizziness.    Objective:  There were no vitals taken for this visit. There is no height or weight on file to calculate BMI. Physical Exam Vitals and nursing note reviewed.  Constitutional:      General: She is not in acute distress.    Appearance: Normal appearance. She is obese. She is ill-appearing. She is not toxic-appearing.  HENT:     Head: Normocephalic and atraumatic.     Nose: Congestion present.  Eyes:     General: No scleral icterus.       Right eye: No discharge.        Left eye: No discharge.     Extraocular Movements: Extraocular movements intact.     Conjunctiva/sclera: Conjunctivae normal.     Pupils: Pupils are equal, round, and reactive to light.  Pulmonary:     Effort: Pulmonary effort is normal.     Comments: Cough present Musculoskeletal:     Cervical back: No tenderness.  Skin:    Findings: No rash.  Neurological:     Mental Status: She is alert and oriented to person, place, and time. Mental status is at baseline.     Motor: No weakness.     Gait: Gait normal.  Psychiatric:        Mood and Affect: Mood normal.        Behavior: Behavior normal.        Thought Content: Thought content normal.        Judgment: Judgment normal.       No  results found. No results found. No results found for this or any previous visit (from the past 24 hour(s)).    Assessment/Plan: Julie Osborne is a 71 y.o. female present for OV for  COVID-19/cough Rest, hydrate.  Continue coricidin Added tesslon perles for cough.  Omnicef BID prescribed- pt high risk of complications with uncontrolled diabetes and obesity molnupiravir Reviewed home care instructions for COVID. Advised self-isolation at home for at least 5 days. After 5 days, if improved and fever resolved, can be in public, but should wear a mask around others for an additional 5 days. If symptoms,  esp, dyspnea develops/worsens, recommend in-person evaluation at either an urgent care or the emergency room.  Follow up in 2 weeks for normal routine scheduled [facility] appt.   Reviewed expectations re: course of current medical issues. Discussed self-management of symptoms. Outlined signs and symptoms indicating need for more acute intervention. Patient verbalized understanding and all questions were answered. Patient received an After-Visit Summary.    Orders Placed This Encounter  Procedures   Comprehensive metabolic panel   Meds ordered this encounter  Medications   molnupiravir EUA (LAGEVRIO) 200 mg CAPS capsule    Sig: Take 4 capsules (800 mg total) by mouth 2 (two) times daily for 5 days.    Dispense:  40 capsule    Refill:  0   benzonatate (TESSALON) 200 MG capsule    Sig: Take 1 capsule (200 mg total) by mouth 2 (two) times daily as needed for cough.    Dispense:  20 capsule    Refill:  0   cefdinir (OMNICEF) 300 MG capsule    Sig: Take 1 capsule (300 mg total) by mouth 2 (two) times daily.    Dispense:  20 capsule    Refill:  0    Referral Orders  No referral(s) requested today    Note is dictated utilizing voice recognition software. Although note has been proof read prior to signing, occasional typographical errors still can be missed. If any questions arise, please do not hesitate to call for verification.   electronically signed by:  [name], DO  [place]

## 2021-12-24 ENCOUNTER — Other Ambulatory Visit: Payer: Self-pay | Admitting: Family Medicine

## 2022-01-14 ENCOUNTER — Other Ambulatory Visit: Payer: Self-pay | Admitting: Family Medicine

## 2022-01-15 ENCOUNTER — Other Ambulatory Visit: Payer: Self-pay | Admitting: Family Medicine

## 2022-01-19 ENCOUNTER — Inpatient Hospital Stay: Admission: RE | Admit: 2022-01-19 | Payer: Medicare Other | Source: Ambulatory Visit

## 2022-02-08 ENCOUNTER — Other Ambulatory Visit: Payer: Self-pay | Admitting: Family Medicine

## 2022-02-09 ENCOUNTER — Other Ambulatory Visit: Payer: Self-pay | Admitting: Family Medicine

## 2022-02-11 ENCOUNTER — Other Ambulatory Visit: Payer: Self-pay | Admitting: Family Medicine

## 2022-02-12 ENCOUNTER — Other Ambulatory Visit: Payer: Self-pay | Admitting: Family Medicine

## 2022-02-12 LAB — HEMOGLOBIN A1C: Hemoglobin A1C: 10.3

## 2022-02-12 LAB — PROTEIN / CREATININE RATIO, URINE: Albumin, U: 7

## 2022-03-28 ENCOUNTER — Other Ambulatory Visit: Payer: Self-pay | Admitting: Family Medicine

## 2022-03-29 ENCOUNTER — Other Ambulatory Visit: Payer: Self-pay | Admitting: Family Medicine

## 2022-03-29 ENCOUNTER — Other Ambulatory Visit: Payer: Self-pay

## 2022-03-29 MED ORDER — METOPROLOL SUCCINATE ER 100 MG PO TB24: 100.0000 mg | ORAL_TABLET | Freq: Every day | ORAL | 0 refills | Status: DC

## 2022-03-29 MED ORDER — AMLODIPINE BESYLATE 10 MG PO TABS: 10.0000 mg | ORAL_TABLET | Freq: Every day | ORAL | 0 refills | Status: DC

## 2022-04-09 HISTORY — PX: GASTRIC BYPASS: SHX52

## 2022-04-16 ENCOUNTER — Ambulatory Visit: Payer: PRIVATE HEALTH INSURANCE | Admitting: Family Medicine

## 2022-04-16 ENCOUNTER — Encounter: Payer: Self-pay | Admitting: Family Medicine

## 2022-04-16 VITALS — BP 119/78 | HR 76 | Temp 98.2°F | Ht 70.0 in | Wt 283.0 lb

## 2022-04-16 DIAGNOSIS — I2510 Atherosclerotic heart disease of native coronary artery without angina pectoris: Secondary | ICD-10-CM

## 2022-04-16 DIAGNOSIS — I10 Essential (primary) hypertension: Secondary | ICD-10-CM | POA: Diagnosis not present

## 2022-04-16 DIAGNOSIS — Z9861 Coronary angioplasty status: Secondary | ICD-10-CM

## 2022-04-16 DIAGNOSIS — E782 Mixed hyperlipidemia: Secondary | ICD-10-CM

## 2022-04-16 DIAGNOSIS — E1165 Type 2 diabetes mellitus with hyperglycemia: Secondary | ICD-10-CM | POA: Diagnosis not present

## 2022-04-16 DIAGNOSIS — F418 Other specified anxiety disorders: Secondary | ICD-10-CM

## 2022-04-16 LAB — HEMOGLOBIN A1C: Hgb A1c MFr Bld: 10.2 % — ABNORMAL HIGH (ref 4.6–6.5)

## 2022-04-16 LAB — LIPID PANEL
Cholesterol: 174 mg/dL (ref 0–200)
HDL: 53.1 mg/dL (ref >39.00)
LDL Cholesterol: 92 mg/dL (ref 0–99)
NonHDL: 121.1
Total CHOL/HDL Ratio: 3
Triglycerides: 147 mg/dL (ref 0.0–149.0)
VLDL: 29.4 mg/dL (ref 0.0–40.0)

## 2022-04-16 LAB — BASIC METABOLIC PANEL
BUN: 10 mg/dL (ref 6–23)
CO2: 31 mEq/L (ref 19–32)
Calcium: 9.3 mg/dL (ref 8.4–10.5)
Chloride: 94 mEq/L — ABNORMAL LOW (ref 96–112)
Creatinine, Ser: 0.62 mg/dL (ref 0.40–1.20)
GFR: 90.88 mL/min (ref >60.00)
Glucose, Bld: 223 mg/dL — ABNORMAL HIGH (ref 70–99)
Potassium: 4.6 mEq/L (ref 3.5–5.1)
Sodium: 132 mEq/L — ABNORMAL LOW (ref 135–145)

## 2022-04-16 LAB — CBC
HCT: 45.3 % (ref 36.0–46.0)
Hemoglobin: 15 g/dL (ref 12.0–15.0)
MCHC: 33.1 g/dL (ref 30.0–36.0)
MCV: 89.4 fl (ref 78.0–100.0)
Platelets: 302 10*3/uL (ref 150.0–400.0)
RBC: 5.07 Mil/uL (ref 3.87–5.11)
RDW: 14 % (ref 11.5–15.5)
WBC: 6 10*3/uL (ref 4.0–10.5)

## 2022-04-16 LAB — TSH: TSH: 2.21 u[IU]/mL (ref 0.35–5.50)

## 2022-04-16 MED ORDER — METOPROLOL SUCCINATE ER 100 MG PO TB24: 100.0000 mg | ORAL_TABLET | Freq: Every day | ORAL | 1 refills | Status: DC

## 2022-04-16 MED ORDER — TIZANIDINE HCL 4 MG PO TABS: 4.0000 mg | ORAL_TABLET | Freq: Four times a day (QID) | ORAL | 5 refills | Status: DC | PRN

## 2022-04-16 MED ORDER — AMLODIPINE BESYLATE 10 MG PO TABS: 10.0000 mg | ORAL_TABLET | Freq: Every day | ORAL | 1 refills | Status: DC

## 2022-04-16 MED ORDER — ROSUVASTATIN CALCIUM 20 MG PO TABS: 20.0000 mg | ORAL_TABLET | Freq: Every day | ORAL | 3 refills | Status: DC

## 2022-04-16 MED ORDER — DULOXETINE HCL 20 MG PO CPEP: 20.0000 mg | ORAL_CAPSULE | Freq: Every day | ORAL | 1 refills | Status: DC

## 2022-04-16 MED ORDER — VALSARTAN-HYDROCHLOROTHIAZIDE 320-25 MG PO TABS: 1.0000 | ORAL_TABLET | Freq: Every day | ORAL | 1 refills | Status: DC

## 2022-04-16 MED ORDER — ALBUTEROL SULFATE HFA 108 (90 BASE) MCG/ACT IN AERS: 1.0000 | INHALATION_SPRAY | Freq: Four times a day (QID) | RESPIRATORY_TRACT | 11 refills | Status: DC | PRN

## 2022-04-16 MED ORDER — FLUTICASONE PROPIONATE 50 MCG/ACT NA SUSP: 2.0000 | Freq: Every day | NASAL | 11 refills | Status: DC

## 2022-04-16 NOTE — Progress Notes (Signed)
Julie Osborne , Jul 09, 1950, 72 y.o., female MRN: [id number] Patient Care Team    Relationship Specialty Notifications Start End  Ma Hillock, DO PCP - General Family Medicine  01/13/16   Leda Roys, [place] Referring Physician Podiatry  12/24/19   Newt Minion, MD Consulting Physician Orthopedic Surgery  08/16/20   Dietels, Lourdes Sledge, OD  Optometry  07/08/21     Chief Complaint  Patient presents with   Hypertension    [facility]; pt is fasting      Subjective:Julie Osborne is a 72 y.o. female patient present for Diabetes/morbid obesity:  Pt reports compliance with metformin 1000 mg BID, Toujeo 80 divided dose (BID and mounjaro weekly. She does have neuropathy of toes and hands on occasions. She is compliant with   cymbalta 20.  She is prescribed lyrica - per podiatry. She has follow-up with Dr. Sharol Given and podiatry for care of her feet.  She reports her fasting sugars have been in the 190s she is now established with endocrinology Onset: 2005  HTN/hyperlipidemia: Pt reports compliance with  amlodipine 10, valsartan-hctz 10-320-25, metoprolol 100 mg daily, Crestor and ASA 81 today. Patient denies chest pain, shortness of breath, dizziness or lower extremity edema.   Anxiety: Patient reports she feels well on Cymbalta  20 mg daily and feels it is working well.  Now mostly used for her neuropathy Prior note: Pt reports since her MIL moved in with them a few weeks ago she finds herself not able to control her emotions. She is having panic attacks in the middle of the night. She feels anxious and has not been able to find a way to cope with the changes. She states her MIL has some cognitive decline and is unable to live on her own. [name] reports she has always had difficulty getting along with her MIL, but it is more difficult now since she is living with her.       07/08/2021    9:41 AM 03/24/2021    8:27 AM 08/16/2020    8:45 AM 03/30/2020    9:31 AM 02/24/2020    9:19 AM   Depression screen PHQ 2/9  Decreased Interest 0 0 0 0 0  Down, Depressed, Hopeless 0 0 1 1 0  PHQ - 2 Score 0 0 1 1 0  Altered sleeping   0 3   Tired, decreased energy   1 3   Change in appetite   1 3   Feeling bad or failure about yourself    0 3   Trouble concentrating   0 3   Moving slowly or fidgety/restless   0 0   Suicidal thoughts   0 0   PHQ-9 Score   3 16     Allergies  Allergen Reactions   Statins     Lipitor,simvatatin, pravastatin.welchol cause myalgia   Social History   Social History Narrative   Married to [employer], 2 children.    BS, RN retired now Printmaker.    Drinks caffeine, uses herbal remedies. Daily vitmain use.   Wears her seatbelt, smoke detector in the home.    exercises routinely.    She feels safe in her relationships.    Past Medical History:  Diagnosis Date   Apnea    Closed fracture of neck of left radius 10/14/2017   Concussion    Degenerative arthritis    generalized/back/LEFT foot   Diabetic neuropathy ([place]) 2017   Diverticulosis 06/19/2013  sigmoid colon on colonoscopy   Hyperlipidemia 2000   on meds   Hypertension, benign 1996   on meds   Lumbar radiculopathy    Severe L4/L5 radiculopathy by old records.    Pes planus 12/17/2011   Tenosynovitis of ankle 2017   post tibial tendon dysfunction stage 5 (PTTD)   Uncontrolled diabetes mellitus 2005   on meds   Past Surgical History:  Procedure Laterality Date   CORONARY ANGIOPLASTY WITH STENT PLACEMENT  10/17/2018   DES x2/LAD   EXPLORATORY LAPAROTOMY  1976   endometriosis   MINOR HEMORRHOIDECTOMY  2006   OTHER SURGICAL HISTORY Right 1975   venous ligation   Family History  Problem Relation Age of Onset   COPD Mother    Diabetes Mellitus II Mother    Heart failure Mother    Osteoarthritis Father    Cancer Father        tumor found behind xyphoid process   Diabetes Sister    Diabetes Brother    Dementia Maternal Grandmother    Heart disease Maternal Grandmother    Heart  disease Maternal Grandfather    Stroke Maternal Grandfather    Heart failure Maternal Aunt    Lung cancer Paternal Grandfather    Lung cancer Paternal Aunt    Colon polyps Neg Hx    Colon cancer Neg Hx    Esophageal cancer Neg Hx    Stomach cancer Neg Hx    Rectal cancer Neg Hx    Allergies as of 04/16/2022       Reactions   Statins    Lipitor,simvatatin, pravastatin.welchol cause myalgia        Medication List        Accurate as of April 16, 2022  8:59 AM. If you have any questions, ask your nurse or doctor.          STOP taking these medications    benzonatate 200 MG capsule Commonly known as: TESSALON Stopped by: [name], DO   cefdinir 300 MG capsule Commonly known as: OMNICEF Stopped by: [name], DO   glipiZIDE 10 MG tablet Commonly known as: GLUCOTROL Stopped by: [name], DO       TAKE these medications    albuterol 108 (90 Base) MCG/ACT inhaler Commonly known as: VENTOLIN HFA Inhale 1-2 puffs into the lungs every 6 (six) hours as needed for wheezing or shortness of breath.   amLODipine 10 MG tablet Commonly known as: NORVASC Take 1 tablet (10 mg total) by mouth daily.   aspirin 81 MG chewable tablet Chew by mouth daily.   budesonide-formoterol 160-4.5 MCG/ACT inhaler Commonly known as: SYMBICORT Inhale 2 puffs into the lungs 2 (two) times daily.   calcium citrate-vitamin D 315-200 MG-UNIT tablet Commonly known as: CITRACAL+D Take 1 tablet by mouth daily.   CoQ10 100 MG Caps Take 1 tablet by mouth daily.   diclofenac Sodium 1 % Gel Commonly known as: Voltaren Apply topically 4 times daily PRN   DULoxetine 20 MG capsule Commonly known as: CYMBALTA Take 1 capsule (20 mg total) by mouth daily.   FIBER-CAPS PO Take by mouth.   fluticasone 50 MCG/ACT nasal spray Commonly known as: FLONASE Place 2 sprays into both nostrils daily.   HumaLOG KwikPen 100 UNIT/ML KwikPen Generic drug: insulin lispro Inject into the skin  3 (three) times daily.   Magnesium 400 MG Caps Take 1 tablet by mouth daily.   metFORMIN 1000 MG tablet Commonly known as: GLUCOPHAGE TAKE 1 TABLET (1,000  MG TOTAL) BY MOUTH TWICE A DAY WITH FOOD   metoprolol succinate 100 MG 24 hr tablet Commonly known as: TOPROL-XL Take 1 tablet (100 mg total) by mouth daily.   Mounjaro 7.5 MG/0.5ML Pen Generic drug: tirzepatide Inject into the skin. What changed: Another medication with the same name was removed. Continue taking this medication, and follow the directions you see here. Changed by: [name], DO   multivitamin capsule Take 1 capsule by mouth daily.   nitroGLYCERIN 0.4 MG SL tablet Commonly known as: NITROSTAT Place under the tongue.   rosuvastatin 20 MG tablet Commonly known as: CRESTOR Take 1 tablet (20 mg total) by mouth daily.   TART CHERRY ADVANCED PO Take by mouth.   tiZANidine 4 MG tablet Commonly known as: ZANAFLEX Take 1 tablet (4 mg total) by mouth every 6 (six) hours as needed for muscle spasms.   Toujeo Max SoloStar 300 UNIT/ML Solostar Pen Generic drug: insulin glargine (2 Unit Dial) SMARTSIG:80 Unit(s) SUB-Q Daily What changed: Another medication with the same name was removed. Continue taking this medication, and follow the directions you see here. Changed by: [name], DO   Turmeric 500 MG Caps Take by mouth.   valsartan-hydrochlorothiazide 320-25 MG tablet Commonly known as: DIOVAN-HCT Take 1 tablet by mouth daily.   VITAMIN B12-FOLIC ACID PO Take 1 tablet by mouth daily.   vitamin C 100 MG tablet Take 1 tablet by mouth daily.   Vitamin D3 75 MCG (3000 UT) Tabs Take by mouth. 2,000 units daily   Vitamin K2 100 MCG Caps Take 1 tablet by mouth daily.   Zinc 25 MG Tabs Take 1 tablet by mouth daily.        All past medical history, surgical history, allergies, family history, immunizations andmedications were updated in the EMR today and reviewed under the history and medication  portions of their EMR.     ROS: Negative, with the exception of above mentioned in HPI  Objective:  BP 119/78   Pulse 76   Temp 98.2 F (36.8 C) (Oral)   Ht '5\' 10"'$  (1.778 m)   Wt 283 lb (128.4 kg)   SpO2 100%   BMI 40.61 kg/m  Body mass index is 40.61 kg/m. Physical Exam Vitals and nursing note reviewed.  Constitutional:      General: She is not in acute distress.    Appearance: Normal appearance. She is obese. She is not ill-appearing, toxic-appearing or diaphoretic.  HENT:     Head: Normocephalic and atraumatic.  Eyes:     General: No scleral icterus.       Right eye: No discharge.        Left eye: No discharge.     Extraocular Movements: Extraocular movements intact.     Conjunctiva/sclera: Conjunctivae normal.     Pupils: Pupils are equal, round, and reactive to light.  Cardiovascular:     Rate and Rhythm: Normal rate and regular rhythm.     Heart sounds: No murmur heard. Pulmonary:     Effort: Pulmonary effort is normal. No respiratory distress.     Breath sounds: Normal breath sounds. No wheezing, rhonchi or rales.  Musculoskeletal:        General: Deformity present. No swelling or tenderness.     Cervical back: Neck supple. No tenderness.     Right lower leg: No edema.     Left lower leg: No edema.  Lymphadenopathy:     Cervical: No cervical adenopathy.  Skin:    General:  Skin is warm and dry.     Coloration: Skin is not jaundiced or pale.     Findings: No erythema or rash.  Neurological:     Mental Status: She is alert and oriented to person, place, and time. Mental status is at baseline.     Motor: No weakness.     Gait: Gait normal.  Psychiatric:        Mood and Affect: Mood normal.        Behavior: Behavior normal.        Thought Content: Thought content normal.        Judgment: Judgment normal.    Diabetic Foot Exam - Simple   No data filed      No results found. No results found. No results found for this or any previous visit (from the  past 24 hour(s)).   Assessment/Plan: Julie Osborne is a 72 y.o. female present for OV for  Hypertension, benign/HLD/coronary artery disease/allergic to statins Stable Continue amlodipine 10 mg daily> advised her to start taking at night Continue valsartan/HCTZ 320-12.5.   Continue metoprolol to 100 mg  Continue ASA 81 Statin allergy - low sodium, exercise.  - routine exercise encouraged.  - f/u 5.5 mos [place]    Morbid obesity (HCC)/Other diabetic neurological complication associated with type 2 diabetes mellitus ([place]) Now established with endocrinology..  Continue metformin 1000 mg twice daily Continue toujeo per endo Continue mounjaro per endo Tried: Rybelsus > pt reports it is too expensive did not start.  Januvia discontinued when Ozempic started. Continue  lyrica per podiatry  PNA series: completed Flu shot: UTD 2022-  Foot exam: Completed.  Also follows with orthopedics and podiatry.  Eye exam: 11/2020- Ophth on [place]. Dr. Dennie Fetters > will make appt A1c: 7.9--> 7.5 --> 6.2 --> 7.6--> 6.4 --> 6.8 >> 7.6>>6.8>>7.9>8.2> 8.1> 9.6> 9.8>9.4> 9.2 >10.3 > collected today Microallb: Up-to-date 09/2021 Now est w/ endo for management   Lumbar back pain with radiculopathy affecting lower extremity/Spinal stenosis, lumbar region, without neurogenic claudication/anxiety Stable Continue Cymbalta to 20 mg daily Zanaflex prescribed flares.  - Ambulatory referral to Psychology placed 03/2020  Return in about 24 weeks (around 10/01/2022) for Routine chronic condition follow-up.   Reviewed expectations re: course of current medical issues. Discussed self-management of symptoms. Outlined signs and symptoms indicating need for more acute intervention. Patient verbalized understanding and all questions were answered. Patient received an After-Visit Summary.    Orders Placed This Encounter  Procedures   CBC   TSH   Basic Metabolic Panel (BMET)   Hemoglobin A1c   Protein / creatinine  ratio, urine   Hemoglobin A1c   Lipid panel   Meds ordered this encounter  Medications   albuterol (VENTOLIN HFA) 108 (90 Base) MCG/ACT inhaler    Sig: Inhale 1-2 puffs into the lungs every 6 (six) hours as needed for wheezing or shortness of breath.    Dispense:  8 g    Refill:  11   amLODipine (NORVASC) 10 MG tablet    Sig: Take 1 tablet (10 mg total) by mouth daily.    Dispense:  90 tablet    Refill:  1   DULoxetine (CYMBALTA) 20 MG capsule    Sig: Take 1 capsule (20 mg total) by mouth daily.    Dispense:  90 capsule    Refill:  1   fluticasone (FLONASE) 50 MCG/ACT nasal spray    Sig: Place 2 sprays into both nostrils daily.    Dispense:  16  g    Refill:  11   metoprolol succinate (TOPROL-XL) 100 MG 24 hr tablet    Sig: Take 1 tablet (100 mg total) by mouth daily.    Dispense:  90 tablet    Refill:  1   rosuvastatin (CRESTOR) 20 MG tablet    Sig: Take 1 tablet (20 mg total) by mouth daily.    Dispense:  90 tablet    Refill:  3   tiZANidine (ZANAFLEX) 4 MG tablet    Sig: Take 1 tablet (4 mg total) by mouth every 6 (six) hours as needed for muscle spasms.    Dispense:  30 tablet    Refill:  5   valsartan-hydrochlorothiazide (DIOVAN-HCT) 320-25 MG tablet    Sig: Take 1 tablet by mouth daily.    Dispense:  90 tablet    Refill:  1    Referral Orders  No referral(s) requested today    Note is dictated utilizing voice recognition software. Although note has been proof read prior to signing, occasional typographical errors still can be missed. If any questions arise, please do not hesitate to call for verification.   electronically signed by:  [name], DO  [place]

## 2022-04-16 NOTE — Patient Instructions (Signed)
No follow-ups on file.        Great to see you today.  I have refilled the medication(s) we provide.   If labs were collected, we will inform you of lab results once received either by echart message or telephone call.   - echart message- for normal results that have been seen by the patient already.   - telephone call: abnormal results or if patient has not viewed results in their echart.  

## 2022-04-17 ENCOUNTER — Telehealth: Payer: Self-pay | Admitting: Family Medicine

## 2022-04-17 NOTE — Telephone Encounter (Signed)
Pt advised of results. 

## 2022-04-17 NOTE — Telephone Encounter (Signed)
Please inform patient: Her kidney function and blood cell counts are normal. Her thyroid function is normal. Her sugar was elevated to 223 at the time of exam and her A1c is 10.2.  I would encourage her to discuss this with her endocrinologist. Her cholesterol overall looked good with an LDL of 92.  Being a diabetic, her goal is less than 70 and LDL and she was closer to this last year.  Encouraged low saturated fat diet that is high in fiber.  Lean meats with fresh fruits and vegetables.  Since she is tolerating the Crestor 20 mg if she is unable to get her LDL down to 70 again, our next visit we will consider increasing her Crestor dose.  We will recheck her cholesterol at her next appointment with [place] in 5 and half months.

## 2022-04-25 ENCOUNTER — Other Ambulatory Visit: Payer: Self-pay | Admitting: Family Medicine

## 2022-05-08 ENCOUNTER — Other Ambulatory Visit: Payer: Self-pay

## 2022-05-08 MED ORDER — METFORMIN HCL 1000 MG PO TABS: ORAL_TABLET | ORAL | 1 refills | Status: DC

## 2022-05-19 LAB — HM DIABETES EYE EXAM

## 2022-06-20 ENCOUNTER — Encounter: Payer: Self-pay | Admitting: Family Medicine

## 2022-06-22 ENCOUNTER — Encounter: Payer: Self-pay | Admitting: Family Medicine

## 2022-06-22 ENCOUNTER — Encounter: Payer: Medicare Other | Admitting: Family Medicine

## 2022-06-22 NOTE — Progress Notes (Signed)
    Office canceled -

## 2022-06-22 NOTE — Patient Instructions (Signed)
No follow-ups on file.        Great to see you today.  I have refilled the medication(s) we provide.   If labs were collected, we will inform you of lab results once received either by echart message or telephone call.   - echart message- for normal results that have been seen by the patient already.   - telephone call: abnormal results or if patient has not viewed results in their echart.  

## 2022-06-25 ENCOUNTER — Encounter: Payer: Self-pay | Admitting: Family Medicine

## 2022-06-25 ENCOUNTER — Ambulatory Visit: Payer: Medicare Other | Admitting: Family Medicine

## 2022-06-25 ENCOUNTER — Ambulatory Visit (INDEPENDENT_AMBULATORY_CARE_PROVIDER_SITE_OTHER): Payer: Medicare Other | Admitting: Family Medicine

## 2022-06-25 VITALS — BP 111/76 | HR 59 | Temp 97.7°F | Wt 284.8 lb

## 2022-06-25 DIAGNOSIS — M9901 Segmental and somatic dysfunction of cervical region: Secondary | ICD-10-CM

## 2022-06-25 DIAGNOSIS — M9900 Segmental and somatic dysfunction of head region: Secondary | ICD-10-CM

## 2022-06-25 DIAGNOSIS — G44209 Tension-type headache, unspecified, not intractable: Secondary | ICD-10-CM

## 2022-06-25 NOTE — Progress Notes (Deleted)
Julie Osborne , 09/15/1950, 72 y.o., female MRN: [id number] Patient Care Team    Relationship Specialty Notifications Start End  Ma Hillock, DO PCP - General Family Medicine  01/13/16   Leda Roys, [place] Referring Physician Podiatry  12/24/19   Newt Minion, MD Consulting Physician Orthopedic Surgery  08/16/20   Dietels, Lourdes Sledge, OD  Optometry  07/08/21     No chief complaint on file.    Subjective: Julie Osborne is a 72 y.o. Pt presents for an OV with complaints of headaches of *** duration.  Associated symptoms include ***.  Pt has tried *** to ease their symptoms.  SigPMH: HTN, OSA, CAD, DM      07/08/2021    9:41 AM 03/24/2021    8:27 AM 08/16/2020    8:45 AM 03/30/2020    9:31 AM 02/24/2020    9:19 AM  Depression screen PHQ 2/9  Decreased Interest 0 0 0 0 0  Down, Depressed, Hopeless 0 0 1 1 0  PHQ - 2 Score 0 0 1 1 0  Altered sleeping   0 3   Tired, decreased energy   1 3   Change in appetite   1 3   Feeling bad or failure about yourself    0 3   Trouble concentrating   0 3   Moving slowly or fidgety/restless   0 0   Suicidal thoughts   0 0   PHQ-9 Score   3 16     Allergies  Allergen Reactions   Statins     Lipitor,simvatatin, pravastatin.welchol cause myalgia   Social History   Social History Narrative   Married to [employer], 2 children.    BS, RN retired now Printmaker.    Drinks caffeine, uses herbal remedies. Daily vitmain use.   Wears her seatbelt, smoke detector in the home.    exercises routinely.    She feels safe in her relationships.    Past Medical History:  Diagnosis Date   Apnea    Closed fracture of neck of left radius 10/14/2017   Concussion    Degenerative arthritis    generalized/back/LEFT foot   Diabetic neuropathy ([place]) 2017   Diverticulosis 06/19/2013   sigmoid colon on colonoscopy   Hyperlipidemia 2000   on meds   Hypertension, benign 1996   on meds   Lumbar radiculopathy    Severe L4/L5 radiculopathy by old  records.    Pes planus 12/17/2011   Posterior tibial tendon dysfunction (PTTD) of left lower extremity 04/10/2015   post tibial tendon dysfunction stage 5 (PTTD)   Tenosynovitis of ankle 2017   post tibial tendon dysfunction stage 5 (PTTD)   Uncontrolled diabetes mellitus 2005   on meds   Past Surgical History:  Procedure Laterality Date   CORONARY ANGIOPLASTY WITH STENT PLACEMENT  10/17/2018   DES x2/LAD   EXPLORATORY LAPAROTOMY  1976   endometriosis   MINOR HEMORRHOIDECTOMY  2006   OTHER SURGICAL HISTORY Right 1975   venous ligation   Family History  Problem Relation Age of Onset   COPD Mother    Diabetes Mellitus II Mother    Heart failure Mother    Osteoarthritis Father    Cancer Father        tumor found behind xyphoid process   Diabetes Sister    Diabetes Brother    Dementia Maternal Grandmother    Heart disease Maternal Grandmother    Heart disease Maternal Grandfather  Stroke Maternal Grandfather    Heart failure Maternal Aunt    Lung cancer Paternal Grandfather    Lung cancer Paternal Aunt    Colon polyps Neg Hx    Colon cancer Neg Hx    Esophageal cancer Neg Hx    Stomach cancer Neg Hx    Rectal cancer Neg Hx    Allergies as of 06/25/2022       Reactions   Statins    Lipitor,simvatatin, pravastatin.welchol cause myalgia        Medication List        Accurate as of June 25, 2022  6:58 AM. If you have any questions, ask your nurse or doctor.          albuterol 108 (90 Base) MCG/ACT inhaler Commonly known as: VENTOLIN HFA Inhale 1-2 puffs into the lungs every 6 (six) hours as needed for wheezing or shortness of breath.   amLODipine 10 MG tablet Commonly known as: NORVASC Take 1 tablet (10 mg total) by mouth daily.   aspirin 81 MG chewable tablet Chew by mouth daily.   budesonide-formoterol 160-4.5 MCG/ACT inhaler Commonly known as: SYMBICORT Inhale 2 puffs into the lungs 2 (two) times daily.   calcium citrate-vitamin D 315-200  MG-UNIT tablet Commonly known as: CITRACAL+D Take 1 tablet by mouth daily.   CoQ10 100 MG Caps Take 1 tablet by mouth daily.   diclofenac Sodium 1 % Gel Commonly known as: Voltaren Apply topically 4 times daily PRN   DULoxetine 20 MG capsule Commonly known as: CYMBALTA Take 1 capsule (20 mg total) by mouth daily.   FIBER-CAPS PO Take by mouth.   fluticasone 50 MCG/ACT nasal spray Commonly known as: FLONASE Place 2 sprays into both nostrils daily.   HumaLOG KwikPen 100 UNIT/ML KwikPen Generic drug: insulin lispro Inject into the skin 3 (three) times daily.   Magnesium 400 MG Caps Take 1 tablet by mouth daily.   metFORMIN 1000 MG tablet Commonly known as: GLUCOPHAGE TAKE 1 TABLET (1,000 MG TOTAL) BY MOUTH TWICE A DAY WITH FOOD   metoprolol succinate 100 MG 24 hr tablet Commonly known as: TOPROL-XL Take 1 tablet (100 mg total) by mouth daily.   Mounjaro 7.5 MG/0.5ML Pen Generic drug: tirzepatide Inject into the skin.   multivitamin capsule Take 1 capsule by mouth daily.   nitroGLYCERIN 0.4 MG SL tablet Commonly known as: NITROSTAT Place under the tongue.   rosuvastatin 20 MG tablet Commonly known as: CRESTOR Take 1 tablet (20 mg total) by mouth daily.   TART CHERRY ADVANCED PO Take by mouth.   tiZANidine 4 MG tablet Commonly known as: ZANAFLEX Take 1 tablet (4 mg total) by mouth every 6 (six) hours as needed for muscle spasms.   Toujeo Max SoloStar 300 UNIT/ML Solostar Pen Generic drug: insulin glargine (2 Unit Dial) SMARTSIG:80 Unit(s) SUB-Q Daily   Turmeric 500 MG Caps Take by mouth.   valsartan-hydrochlorothiazide 320-25 MG tablet Commonly known as: DIOVAN-HCT Take 1 tablet by mouth daily.   VITAMIN B12-FOLIC ACID PO Take 1 tablet by mouth daily.   vitamin C 100 MG tablet Take 1 tablet by mouth daily.   Vitamin D3 75 MCG (3000 UT) Tabs Generic drug: Cholecalciferol Take by mouth. 2,000 units daily   Vitamin K2 100 MCG Caps Take 1 tablet  by mouth daily.   Zinc 25 MG Tabs Take 1 tablet by mouth daily.        All past medical history, surgical history, allergies, family history, immunizations andmedications were  updated in the EMR today and reviewed under the history and medication portions of their EMR.     ROS Negative, with the exception of above mentioned in HPI   Objective:  There were no vitals taken for this visit. There is no height or weight on file to calculate BMI.  Physical Exam   No results found. No results found. No results found for this or any previous visit (from the past 24 hour(s)).  Assessment/Plan: Julie Osborne is a 72 y.o. female present for OV for  *** Reviewed expectations re: course of current medical issues. Discussed self-management of symptoms. Outlined signs and symptoms indicating need for more acute intervention. Patient verbalized understanding and all questions were answered. Patient received an After-Visit Summary.    No orders of the defined types were placed in this encounter.  No orders of the defined types were placed in this encounter.  Referral Orders  No referral(s) requested today     Note is dictated utilizing voice recognition software. Although note has been proof read prior to signing, occasional typographical errors still can be missed. If any questions arise, please do not hesitate to call for verification.   electronically signed by:  [name], DO  [place]

## 2022-06-25 NOTE — Patient Instructions (Signed)
No follow-ups on file.        Great to see you today.  I have refilled the medication(s) we provide.   If labs were collected, we will inform you of lab results once received either by echart message or telephone call.   - echart message- for normal results that have been seen by the patient already.   - telephone call: abnormal results or if patient has not viewed results in their echart.  

## 2022-06-25 NOTE — Progress Notes (Signed)
Julie Osborne , 01/29/1951, 72 y.o., female MRN: [id number] Patient Care Team    Relationship Specialty Notifications Start End  Ma Hillock, DO PCP - General Family Medicine  01/13/16   Leda Roys, [place] Referring Physician Podiatry  12/24/19   Newt Minion, MD Consulting Physician Orthopedic Surgery  08/16/20   Dietels, Lourdes Sledge, OD  Optometry  07/08/21     Chief Complaint  Patient presents with   Headache    1 month daily HA has gotten worse over the last 2 weeks. Moving HA, starts in the back moves to the front.      Subjective: Julie Osborne is a 72 y.o. Pt presents for an OV with complaints of a headache of 1 month duration that has worsened over the last 2 weeks.  Patient reports she did go have her eye exam updated and her eye exam was fine.  She reports she has changed jobs recently and is performing more computer work.  She is using a large computer screen.  She does not have an antiglare screen.  She reports the monitor is straightahead from her.  Her keyboard is at a relaxed neutral position. Her headaches will start along the back of her neck and head region and can radiate to her forehead.  She reports that can happen in the middle of the night, sometimes she wakes up with a headache, and sometimes her head is fine morning but then later in the day she gets a headache.  She reports she is having a headache daily. She denies any associated factors with her headache, other than it seems like lower like can be helpful.     06/25/2022   11:33 AM 07/08/2021    9:41 AM 03/24/2021    8:27 AM 08/16/2020    8:45 AM 03/30/2020    9:31 AM  Depression screen PHQ 2/9  Decreased Interest 0 0 0 0 0  Down, Depressed, Hopeless 0 0 0 1 1  PHQ - 2 Score 0 0 0 1 1  Altered sleeping    0 3  Tired, decreased energy    1 3  Change in appetite    1 3  Feeling bad or failure about yourself     0 3  Trouble concentrating    0 3  Moving slowly or fidgety/restless    0 0   Suicidal thoughts    0 0  PHQ-9 Score    3 16    Allergies  Allergen Reactions   Statins     Lipitor,simvatatin, pravastatin.welchol cause myalgia   Social History   Social History Narrative   Married to [employer], 2 children.    BS, RN retired now Printmaker.    Drinks caffeine, uses herbal remedies. Daily vitmain use.   Wears her seatbelt, smoke detector in the home.    exercises routinely.    She feels safe in her relationships.    Past Medical History:  Diagnosis Date   Apnea    Closed fracture of neck of left radius 10/14/2017   Concussion    Degenerative arthritis    generalized/back/LEFT foot   Diabetic neuropathy ([place]) 2017   Diverticulosis 06/19/2013   sigmoid colon on colonoscopy   Hyperlipidemia 2000   on meds   Hypertension, benign 1996   on meds   Lumbar radiculopathy    Severe L4/L5 radiculopathy by old records.    Pes planus 12/17/2011   Posterior tibial tendon dysfunction (PTTD)  of left lower extremity 04/10/2015   post tibial tendon dysfunction stage 5 (PTTD)   Tenosynovitis of ankle 2017   post tibial tendon dysfunction stage 5 (PTTD)   Uncontrolled diabetes mellitus 2005   on meds   Past Surgical History:  Procedure Laterality Date   CORONARY ANGIOPLASTY WITH STENT PLACEMENT  10/17/2018   DES x2/LAD   EXPLORATORY LAPAROTOMY  1976   endometriosis   MINOR HEMORRHOIDECTOMY  2006   OTHER SURGICAL HISTORY Right 1975   venous ligation   Family History  Problem Relation Age of Onset   COPD Mother    Diabetes Mellitus II Mother    Heart failure Mother    Osteoarthritis Father    Cancer Father        tumor found behind xyphoid process   Diabetes Sister    Diabetes Brother    Dementia Maternal Grandmother    Heart disease Maternal Grandmother    Heart disease Maternal Grandfather    Stroke Maternal Grandfather    Heart failure Maternal Aunt    Lung cancer Paternal Grandfather    Lung cancer Paternal Aunt    Colon polyps Neg Hx    Colon cancer  Neg Hx    Esophageal cancer Neg Hx    Stomach cancer Neg Hx    Rectal cancer Neg Hx    Allergies as of 06/25/2022       Reactions   Statins    Lipitor,simvatatin, pravastatin.welchol cause myalgia        Medication List        Accurate as of June 25, 2022 12:56 PM. If you have any questions, ask your nurse or doctor.          albuterol 108 (90 Base) MCG/ACT inhaler Commonly known as: VENTOLIN HFA Inhale 1-2 puffs into the lungs every 6 (six) hours as needed for wheezing or shortness of breath.   amLODipine 10 MG tablet Commonly known as: NORVASC Take 1 tablet (10 mg total) by mouth daily.   aspirin 81 MG chewable tablet Chew by mouth daily.   budesonide-formoterol 160-4.5 MCG/ACT inhaler Commonly known as: SYMBICORT Inhale 2 puffs into the lungs 2 (two) times daily.   calcium citrate-vitamin D 315-200 MG-UNIT tablet Commonly known as: CITRACAL+D Take 1 tablet by mouth daily.   CoQ10 100 MG Caps Take 1 tablet by mouth daily.   diclofenac Sodium 1 % Gel Commonly known as: Voltaren Apply topically 4 times daily PRN   DULoxetine 20 MG capsule Commonly known as: CYMBALTA Take 1 capsule (20 mg total) by mouth daily.   FIBER-CAPS PO Take by mouth.   fluticasone 50 MCG/ACT nasal spray Commonly known as: FLONASE Place 2 sprays into both nostrils daily.   HumaLOG KwikPen 100 UNIT/ML KwikPen Generic drug: insulin lispro Inject into the skin 3 (three) times daily.   Magnesium 400 MG Caps Take 1 tablet by mouth daily.   metFORMIN 1000 MG tablet Commonly known as: GLUCOPHAGE TAKE 1 TABLET (1,000 MG TOTAL) BY MOUTH TWICE A DAY WITH FOOD   metoprolol succinate 100 MG 24 hr tablet Commonly known as: TOPROL-XL Take 1 tablet (100 mg total) by mouth daily.   Mounjaro 7.5 MG/0.5ML Pen Generic drug: tirzepatide Inject into the skin.   multivitamin capsule Take 1 capsule by mouth daily.   nitroGLYCERIN 0.4 MG SL tablet Commonly known as: NITROSTAT Place  under the tongue.   rosuvastatin 20 MG tablet Commonly known as: CRESTOR Take 1 tablet (20 mg total) by mouth daily.  TART CHERRY ADVANCED PO Take by mouth.   tiZANidine 4 MG tablet Commonly known as: ZANAFLEX Take 1 tablet (4 mg total) by mouth every 6 (six) hours as needed for muscle spasms.   Toujeo Max SoloStar 300 UNIT/ML Solostar Pen Generic drug: insulin glargine (2 Unit Dial) SMARTSIG:80 Unit(s) SUB-Q Daily   Turmeric 500 MG Caps Take by mouth.   valsartan-hydrochlorothiazide 320-25 MG tablet Commonly known as: DIOVAN-HCT Take 1 tablet by mouth daily.   VITAMIN B12-FOLIC ACID PO Take 1 tablet by mouth daily.   vitamin C 100 MG tablet Take 1 tablet by mouth daily.   Vitamin D3 75 MCG (3000 UT) Tabs Generic drug: Cholecalciferol Take by mouth. 2,000 units daily   Vitamin K2 100 MCG Caps Take 1 tablet by mouth daily.   Zinc 25 MG Tabs Take 1 tablet by mouth daily.        All past medical history, surgical history, allergies, family history, immunizations andmedications were updated in the EMR today and reviewed under the history and medication portions of their EMR.     ROS Negative, with the exception of above mentioned in HPI   Objective:  BP 111/76   Pulse (!) 59   Temp 97.7 F (36.5 C)   Wt 284 lb 12.8 oz (129.2 kg)   SpO2 98%   BMI 40.86 kg/m  Body mass index is 40.86 kg/m. Physical Exam Vitals and nursing note reviewed.  Constitutional:      General: She is not in acute distress.    Appearance: Normal appearance. She is not ill-appearing, toxic-appearing or diaphoretic.  HENT:     Head: Normocephalic and atraumatic.  Eyes:     General: No scleral icterus.       Right eye: No discharge.        Left eye: No discharge.     Extraocular Movements: Extraocular movements intact.     Conjunctiva/sclera: Conjunctivae normal.     Pupils: Pupils are equal, round, and reactive to light.  Cardiovascular:     Rate and Rhythm: Normal rate and  regular rhythm.  Pulmonary:     Effort: Pulmonary effort is normal. No respiratory distress.     Breath sounds: Normal breath sounds. No wheezing, rhonchi or rales.  Musculoskeletal:     Right lower leg: No edema.     Left lower leg: No edema.  Skin:    General: Skin is warm.     Findings: No rash.  Neurological:     Mental Status: She is alert and oriented to person, place, and time. Mental status is at baseline.     Motor: No weakness.     Gait: Gait normal.  Psychiatric:        Mood and Affect: Mood normal.        Behavior: Behavior normal.        Thought Content: Thought content normal.        Judgment: Judgment normal.    Osteopathic findings: Cervical: Hypertonic cervical muscles.  C3-4 NSLRR. TTP C3 . Decreased ROM LSB.   No results found. No results found. No results found for this or any previous visit (from the past 24 hour(s)).  Assessment/Plan: Don Giarrusso is a 72 y.o. female present for OV for  Tension headache We discussed daily stretches of upper back and neck region. Encouraged her to get an antiglare screen. Encouraged her to move her monitor in a more neutral position. Encouraged her to schedule massage. Heat therapy can be helpful. Muscle  relaxant encouraged at least 1 hour before bed, she has a prescription for Zanaflex she can use. Follow-up in 4 weeks if not seeing any improvement or symptoms are worsening. Nonallopathic problems Cervical somatic dysfunction Decision today to treat with OMT was based on Physical Exam After verbal consent patient was treated with cervical kneading and suboccipital release techniques in cervical and occipital/cranial areas Patient tolerated the procedure well with improvement in symptoms Patient given exercises, stretches and lifestyle modifications See medications in patient instructions if given   Patient will follow up in 4 prn     Reviewed expectations re: course of current medical issues. Discussed  self-management of symptoms. Outlined signs and symptoms indicating need for more acute intervention. Patient verbalized understanding and all questions were answered. Patient received an After-Visit Summary.    No orders of the defined types were placed in this encounter.  No orders of the defined types were placed in this encounter.  Referral Orders  No referral(s) requested today     Note is dictated utilizing voice recognition software. Although note has been proof read prior to signing, occasional typographical errors still can be missed. If any questions arise, please do not hesitate to call for verification.   electronically signed by:  [name], DO  [place]

## 2022-06-26 ENCOUNTER — Other Ambulatory Visit: Payer: Self-pay | Admitting: Family Medicine

## 2022-06-26 DIAGNOSIS — Z780 Asymptomatic menopausal state: Secondary | ICD-10-CM

## 2022-06-26 DIAGNOSIS — Z13820 Encounter for screening for osteoporosis: Secondary | ICD-10-CM

## 2022-06-29 ENCOUNTER — Other Ambulatory Visit: Payer: Medicare Other

## 2022-07-08 ENCOUNTER — Encounter: Payer: Self-pay | Admitting: Family Medicine

## 2022-07-12 ENCOUNTER — Encounter: Payer: Self-pay | Admitting: Family Medicine

## 2022-07-12 ENCOUNTER — Ambulatory Visit (INDEPENDENT_AMBULATORY_CARE_PROVIDER_SITE_OTHER): Payer: Medicare Other | Admitting: Family Medicine

## 2022-07-12 VITALS — BP 134/85 | HR 87 | Temp 97.6°F | Wt 283.2 lb

## 2022-07-12 DIAGNOSIS — J329 Chronic sinusitis, unspecified: Secondary | ICD-10-CM

## 2022-07-12 DIAGNOSIS — G44209 Tension-type headache, unspecified, not intractable: Secondary | ICD-10-CM | POA: Diagnosis not present

## 2022-07-12 DIAGNOSIS — B9689 Other specified bacterial agents as the cause of diseases classified elsewhere: Secondary | ICD-10-CM | POA: Diagnosis not present

## 2022-07-12 MED ORDER — DOXYCYCLINE HYCLATE 100 MG PO TABS: 100.0000 mg | ORAL_TABLET | Freq: Two times a day (BID) | ORAL | 0 refills | Status: DC

## 2022-07-12 NOTE — Patient Instructions (Addendum)
Return if symptoms worsen or fail to improve.        Great to see you today.  I have refilled the medication(s) we provide.   If labs were collected, we will inform you of lab results once received either by echart message or telephone call.   - echart message- for normal results that have been seen by the patient already.   - telephone call: abnormal results or if patient has not viewed results in their echart.  

## 2022-07-12 NOTE — Progress Notes (Signed)
Julie Osborne , 1950-12-03, 72 y.o., female MRN: [id number] Patient Care Team    Relationship Specialty Notifications Start End  Ma Hillock, DO PCP - General Family Medicine  01/13/16   Leda Roys, [place] Referring Physician Podiatry  12/24/19   Newt Minion, MD Consulting Physician Orthopedic Surgery  08/16/20   Dietels, Lourdes Sledge, OD  Optometry  07/08/21     Chief Complaint  Patient presents with   Headache    Earache, sore throat     Subjective: Julie Osborne is a 72 y.o. Pt presents for an OV with complaints of headache, earache and sore throat of 1 month duration.  Associated symptoms include headache (more tension headache- responding to treatment) Pt has tried tylenol and sudafed to ease their symptoms.       06/25/2022   11:33 AM 07/08/2021    9:41 AM 03/24/2021    8:27 AM 08/16/2020    8:45 AM 03/30/2020    9:31 AM  Depression screen PHQ 2/9  Decreased Interest 0 0 0 0 0  Down, Depressed, Hopeless 0 0 0 1 1  PHQ - 2 Score 0 0 0 1 1  Altered sleeping    0 3  Tired, decreased energy    1 3  Change in appetite    1 3  Feeling bad or failure about yourself     0 3  Trouble concentrating    0 3  Moving slowly or fidgety/restless    0 0  Suicidal thoughts    0 0  PHQ-9 Score    3 16    Allergies  Allergen Reactions   Statins     Lipitor,simvatatin, pravastatin.welchol cause myalgia   Social History   Social History Narrative   Married to [employer], 2 children.    BS, RN retired now Printmaker.    Drinks caffeine, uses herbal remedies. Daily vitmain use.   Wears her seatbelt, smoke detector in the home.    exercises routinely.    She feels safe in her relationships.    Past Medical History:  Diagnosis Date   Apnea    Closed fracture of neck of left radius 10/14/2017   Concussion    Degenerative arthritis    generalized/back/LEFT foot   Diabetic neuropathy 2017   Diverticulosis 06/19/2013   sigmoid colon on colonoscopy   Hyperlipidemia 2000    on meds   Hypertension, benign 1996   on meds   Lumbar radiculopathy    Severe L4/L5 radiculopathy by old records.    Pes planus 12/17/2011   Posterior tibial tendon dysfunction (PTTD) of left lower extremity 04/10/2015   post tibial tendon dysfunction stage 5 (PTTD)   Tenosynovitis of ankle 2017   post tibial tendon dysfunction stage 5 (PTTD)   Uncontrolled diabetes mellitus 2005   on meds   Past Surgical History:  Procedure Laterality Date   CORONARY ANGIOPLASTY WITH STENT PLACEMENT  10/17/2018   DES x2/LAD   EXPLORATORY LAPAROTOMY  1976   endometriosis   MINOR HEMORRHOIDECTOMY  2006   OTHER SURGICAL HISTORY Right 1975   venous ligation   Family History  Problem Relation Age of Onset   COPD Mother    Diabetes Mellitus II Mother    Heart failure Mother    Osteoarthritis Father    Cancer Father        tumor found behind xyphoid process   Diabetes Sister    Diabetes Brother    Dementia Maternal  Grandmother    Heart disease Maternal Grandmother    Heart disease Maternal Grandfather    Stroke Maternal Grandfather    Heart failure Maternal Aunt    Lung cancer Paternal Grandfather    Lung cancer Paternal Aunt    Colon polyps Neg Hx    Colon cancer Neg Hx    Esophageal cancer Neg Hx    Stomach cancer Neg Hx    Rectal cancer Neg Hx    Allergies as of 07/12/2022       Reactions   Statins    Lipitor,simvatatin, pravastatin.welchol cause myalgia        Medication List        Accurate as of July 12, 2022  8:20 AM. If you have any questions, ask your nurse or doctor.          STOP taking these medications    Turmeric 500 MG Caps Stopped by: [name], DO       TAKE these medications    albuterol 108 (90 Base) MCG/ACT inhaler Commonly known as: VENTOLIN HFA Inhale 1-2 puffs into the lungs every 6 (six) hours as needed for wheezing or shortness of breath.   amLODipine 10 MG tablet Commonly known as: NORVASC Take 1 tablet (10 mg total) by mouth  daily.   aspirin 81 MG chewable tablet Chew by mouth daily.   budesonide-formoterol 160-4.5 MCG/ACT inhaler Commonly known as: SYMBICORT Inhale 2 puffs into the lungs 2 (two) times daily.   calcium citrate-vitamin D 315-200 MG-UNIT tablet Commonly known as: CITRACAL+D Take 1 tablet by mouth daily.   CoQ10 100 MG Caps Take 1 tablet by mouth daily.   diclofenac Sodium 1 % Gel Commonly known as: Voltaren Apply topically 4 times daily PRN   doxycycline 100 MG tablet Commonly known as: VIBRA-TABS Take 1 tablet (100 mg total) by mouth 2 (two) times daily. Started by: [name], DO   DULoxetine 20 MG capsule Commonly known as: CYMBALTA Take 1 capsule (20 mg total) by mouth daily.   FIBER-CAPS PO Take by mouth.   fluticasone 50 MCG/ACT nasal spray Commonly known as: FLONASE Place 2 sprays into both nostrils daily.   HumaLOG KwikPen 100 UNIT/ML KwikPen Generic drug: insulin lispro Inject into the skin 3 (three) times daily.   Magnesium 400 MG Caps Take 1 tablet by mouth daily.   metFORMIN 1000 MG tablet Commonly known as: GLUCOPHAGE TAKE 1 TABLET (1,000 MG TOTAL) BY MOUTH TWICE A DAY WITH FOOD   metoprolol succinate 100 MG 24 hr tablet Commonly known as: TOPROL-XL Take 1 tablet (100 mg total) by mouth daily.   multivitamin capsule Take 1 capsule by mouth daily.   nitroGLYCERIN 0.4 MG SL tablet Commonly known as: NITROSTAT Place under the tongue.   rosuvastatin 20 MG tablet Commonly known as: CRESTOR Take 1 tablet (20 mg total) by mouth daily.   TART CHERRY ADVANCED PO Take by mouth.   tirzepatide 12.5 MG/0.5ML Pen Commonly known as: MOUNJARO Inject into the skin.   tiZANidine 4 MG tablet Commonly known as: ZANAFLEX Take 1 tablet (4 mg total) by mouth every 6 (six) hours as needed for muscle spasms.   Toujeo Max SoloStar 300 UNIT/ML Solostar Pen Generic drug: insulin glargine (2 Unit Dial) 90 unit sub q   valsartan-hydrochlorothiazide 320-25 MG  tablet Commonly known as: DIOVAN-HCT Take 1 tablet by mouth daily.   VITAMIN B12-FOLIC ACID PO Take 1 tablet by mouth daily.   vitamin C 100 MG tablet Take 1 tablet  by mouth daily.   Vitamin D3 75 MCG (3000 UT) Tabs Generic drug: Cholecalciferol Take by mouth. 2,000 units daily   Vitamin K2 100 MCG Caps Take 1 tablet by mouth daily.   Zinc 25 MG Tabs Take 1 tablet by mouth daily.        All past medical history, surgical history, allergies, family history, immunizations andmedications were updated in the EMR today and reviewed under the history and medication portions of their EMR.     Review of Systems  Constitutional:  Positive for malaise/fatigue. Negative for chills and fever.  HENT:  Positive for congestion, ear pain, sinus pain and sore throat. Negative for ear discharge.   Eyes: Negative.   Respiratory:  Positive for cough. Negative for sputum production, shortness of breath and wheezing.   Cardiovascular: Negative.   Gastrointestinal: Negative.   Genitourinary: Negative.   Musculoskeletal: Negative.   Skin:  Negative for rash.  Neurological:  Positive for headaches.  Endo/Heme/Allergies: Negative.   Psychiatric/Behavioral: Negative.     Negative, with the exception of above mentioned in HPI   Objective:  BP 134/85   Pulse 87   Temp 97.6 F (36.4 C)   Wt 283 lb 3.2 oz (128.5 kg)   SpO2 98%   BMI 40.63 kg/m  Body mass index is 40.63 kg/m. Physical Exam Vitals and nursing note reviewed.  Constitutional:      General: She is not in acute distress.    Appearance: Normal appearance. She is normal weight. She is not ill-appearing or toxic-appearing.  HENT:     Head: Normocephalic and atraumatic.     Comments: Mild b/l ear effusion, no erythema.     Right Ear: Tympanic membrane, ear canal and external ear normal.     Left Ear: Tympanic membrane, ear canal and external ear normal.     Nose: Congestion and rhinorrhea present.     Right Turbinates:  Swollen. Not enlarged.     Left Turbinates: Swollen. Not enlarged.     Right Sinus: Maxillary sinus tenderness present.     Left Sinus: Maxillary sinus tenderness present.     Mouth/Throat:     Mouth: Mucous membranes are moist.     Pharynx: No oropharyngeal exudate or posterior oropharyngeal erythema.  Eyes:     General: No scleral icterus.       Right eye: No discharge.        Left eye: No discharge.     Extraocular Movements: Extraocular movements intact.     Conjunctiva/sclera: Conjunctivae normal.     Pupils: Pupils are equal, round, and reactive to light.  Cardiovascular:     Rate and Rhythm: Normal rate and regular rhythm.  Pulmonary:     Effort: Pulmonary effort is normal. No respiratory distress.     Breath sounds: Normal breath sounds. No wheezing, rhonchi or rales.  Musculoskeletal:     Cervical back: Neck supple. No tenderness.  Lymphadenopathy:     Cervical: No cervical adenopathy.  Skin:    Findings: No rash.  Neurological:     Mental Status: She is alert and oriented to person, place, and time. Mental status is at baseline.     Motor: No weakness.     Coordination: Coordination normal.     Gait: Gait normal.  Psychiatric:        Mood and Affect: Mood normal.        Behavior: Behavior normal.        Thought Content: Thought content normal.  Judgment: Judgment normal.     No results found. No results found. No results found for this or any previous visit (from the past 24 hour(s)).  Assessment/Plan: Julie Osborne is a 72 y.o. female present for OV for  Bacterial sinusitis Rest, hydrate.  add flonase, zyrtec qhs, mucinex (DM if cough), nettie pot or nasal saline.  Stop sudafed Doxy bid prescribed, take until completed.  F/U 2 weeks if not improved.    Tension headache Improving per pt.  She has followed directions from prior visit and she has scheduled weekly massages.  Reviewed expectations re: course of current medical issues. Discussed  self-management of symptoms. Outlined signs and symptoms indicating need for more acute intervention. Patient verbalized understanding and all questions were answered. Patient received an After-Visit Summary.    No orders of the defined types were placed in this encounter.  Meds ordered this encounter  Medications   doxycycline (VIBRA-TABS) 100 MG tablet    Sig: Take 1 tablet (100 mg total) by mouth 2 (two) times daily.    Dispense:  20 tablet    Refill:  0   Referral Orders  No referral(s) requested today     Note is dictated utilizing voice recognition software. Although note has been proof read prior to signing, occasional typographical errors still can be missed. If any questions arise, please do not hesitate to call for verification.   electronically signed by:  [name], DO  [place]

## 2022-08-01 ENCOUNTER — Other Ambulatory Visit: Payer: Self-pay | Admitting: Family Medicine

## 2022-08-01 ENCOUNTER — Ambulatory Visit (INDEPENDENT_AMBULATORY_CARE_PROVIDER_SITE_OTHER): Payer: Medicare Other

## 2022-08-01 VITALS — Wt 281.0 lb

## 2022-08-01 DIAGNOSIS — Z0000 Encounter for general adult medical examination without abnormal findings: Secondary | ICD-10-CM | POA: Diagnosis not present

## 2022-08-01 NOTE — Progress Notes (Signed)
I connected with  Julie Osborne on 08/01/22 by a audio enabled telemedicine application and verified that I am speaking with the correct person using two identifiers.  Patient Location: Home  Provider Location: Home Office  I discussed the limitations of evaluation and management by telemedicine. The patient expressed understanding and agreed to proceed.   Subjective:   Julie Osborne is a 72 y.o. female who presents for Medicare Annual (Subsequent) preventive examination.  Review of Systems     Cardiac Risk Factors include: advanced age (>59men, >18 women);obesity (BMI >30kg/m2);diabetes mellitus;dyslipidemia;hypertension     Objective:    Today's Vitals   08/01/22 0836  Weight: 281 lb (127.5 kg)   Body mass index is 40.32 kg/m.     08/01/2022    8:44 AM 07/08/2021    9:42 AM 03/15/2020    2:25 PM 02/24/2020    9:15 AM 06/07/2016    5:14 PM  Advanced Directives  Does Patient Have a Medical Advance Directive? Yes No Yes No No  Type of Estate agent of [place];Living will  Living will    Copy of Healthcare Power of Attorney in Chart? No - copy requested      Would patient like information on creating a medical advance directive?  Yes (MAU/Ambulatory/Procedural Areas - Information given)  Yes (MAU/Ambulatory/Procedural Areas - Information given) Yes (ED - Information included in AVS)    Current Medications (verified) Outpatient Encounter Medications as of 08/01/2022  Medication Sig   albuterol (VENTOLIN HFA) 108 (90 Base) MCG/ACT inhaler Inhale 1-2 puffs into the lungs every 6 (six) hours as needed for wheezing or shortness of breath.   amLODipine (NORVASC) 10 MG tablet Take 1 tablet (10 mg total) by mouth daily.   Ascorbic Acid (VITAMIN C) 100 MG tablet Take 1 tablet by mouth daily.    aspirin 81 MG chewable tablet Chew by mouth daily.    calcium citrate-vitamin D (CITRACAL+D) 315-200 MG-UNIT tablet Take 1 tablet by mouth daily.    Calcium Polycarbophil  (FIBER-CAPS PO) Take by mouth.    Cholecalciferol (VITAMIN D3) 75 MCG (3000 UT) TABS Take by mouth. 2,000 units daily   Coenzyme Q10 (COQ10) 100 MG CAPS Take 1 tablet by mouth daily.    diclofenac Sodium (VOLTAREN) 1 % GEL Apply topically 4 times daily PRN   doxycycline (VIBRA-TABS) 100 MG tablet Take 1 tablet (100 mg total) by mouth 2 (two) times daily.   DULoxetine (CYMBALTA) 20 MG capsule Take 1 capsule (20 mg total) by mouth daily.   fluticasone (FLONASE) 50 MCG/ACT nasal spray Place 2 sprays into both nostrils daily.   HUMALOG KWIKPEN 100 UNIT/ML KwikPen Inject into the skin 3 (three) times daily.   Magnesium 400 MG CAPS Take 1 tablet by mouth daily.    Menaquinone-7 (VITAMIN K2) 100 MCG CAPS Take 1 tablet by mouth daily.    metFORMIN (GLUCOPHAGE) 1000 MG tablet TAKE 1 TABLET (1,000 MG TOTAL) BY MOUTH TWICE A DAY WITH FOOD   metoprolol succinate (TOPROL-XL) 100 MG 24 hr tablet Take 1 tablet (100 mg total) by mouth daily.   Misc Natural Products (TART CHERRY ADVANCED PO) Take by mouth.    Multiple Vitamin (MULTIVITAMIN) capsule Take 1 capsule by mouth daily.    nitroGLYCERIN (NITROSTAT) 0.4 MG SL tablet Place under the tongue.    rosuvastatin (CRESTOR) 20 MG tablet Take 1 tablet (20 mg total) by mouth daily.   tirzepatide [facility]) 12.5 MG/0.5ML Pen Inject into the skin.   tiZANidine (ZANAFLEX) 4 MG  tablet Take 1 tablet (4 mg total) by mouth every 6 (six) hours as needed for muscle spasms.   TOUJEO MAX SOLOSTAR 300 UNIT/ML Solostar Pen 90 unit sub q   valsartan-hydrochlorothiazide (DIOVAN-HCT) 320-25 MG tablet Take 1 tablet by mouth daily.   Zinc 25 MG TABS Take 1 tablet by mouth daily.    Cobalamin Combinations (VITAMIN B12-FOLIC ACID PO) Take 1 tablet by mouth daily.  (Patient not taking: Reported on 08/01/2022)   [DISCONTINUED] budesonide-formoterol (SYMBICORT) 160-4.5 MCG/ACT inhaler Inhale 2 puffs into the lungs 2 (two) times daily. (Patient not taking: Reported on 06/25/2022)   No  facility-administered encounter medications on file as of 08/01/2022.    Allergies (verified) Statins   History: Past Medical History:  Diagnosis Date   Apnea    Closed fracture of neck of left radius 10/14/2017   Concussion    Degenerative arthritis    generalized/back/LEFT foot   Diabetic neuropathy 2017   Diverticulosis 06/19/2013   sigmoid colon on colonoscopy   Hyperlipidemia 2000   on meds   Hypertension, benign 1996   on meds   Lumbar radiculopathy    Severe L4/L5 radiculopathy by old records.    Pes planus 12/17/2011   Posterior tibial tendon dysfunction (PTTD) of left lower extremity 04/10/2015   post tibial tendon dysfunction stage 5 (PTTD)   Tenosynovitis of ankle 2017   post tibial tendon dysfunction stage 5 (PTTD)   Uncontrolled diabetes mellitus 2005   on meds   Past Surgical History:  Procedure Laterality Date   CORONARY ANGIOPLASTY WITH STENT PLACEMENT  10/17/2018   DES x2/LAD   EXPLORATORY LAPAROTOMY  1976   endometriosis   MINOR HEMORRHOIDECTOMY  2006   OTHER SURGICAL HISTORY Right 1975   venous ligation   Family History  Problem Relation Age of Onset   COPD Mother    Diabetes Mellitus II Mother    Heart failure Mother    Osteoarthritis Father    Cancer Father        tumor found behind xyphoid process   Diabetes Sister    Diabetes Brother    Dementia Maternal Grandmother    Heart disease Maternal Grandmother    Heart disease Maternal Grandfather    Stroke Maternal Grandfather    Heart failure Maternal Aunt    Lung cancer Paternal Grandfather    Lung cancer Paternal Aunt    Colon polyps Neg Hx    Colon cancer Neg Hx    Esophageal cancer Neg Hx    Stomach cancer Neg Hx    Rectal cancer Neg Hx    Social History   Socioeconomic History   Marital status: Married    Spouse name: Tim   Number of children: 2   Years of education: 16   Highest education level: Not on file  Occupational History   Occupation: Runner, broadcasting/film/video   Occupation:  retired Engineer, civil (consulting)  Tobacco Use   Smoking status: Former    Types: Cigarettes    Quit date: 04/10/2011    Years since quitting: 11.3   Smokeless tobacco: Never  Vaping Use   Vaping Use: Never used  Substance and Sexual Activity   Alcohol use: No   Drug use: No   Sexual activity: Yes    Partners: Male    Birth control/protection: None    Comment: Married  Other Topics Concern   Not on file  Social History Narrative   Married to Elma, 2 children.    BS, RN retired now Agricultural consultant.  Drinks caffeine, uses herbal remedies. Daily vitmain use.   Wears her seatbelt, smoke detector in the home.    exercises routinely.    She feels safe in her relationships.    Social Determinants of Health   Financial Resource Strain: Low Risk  (08/01/2022)   Overall Financial Resource Strain (CARDIA)    Difficulty of Paying Living Expenses: Not hard at all  Food Insecurity: No Food Insecurity (08/01/2022)   Hunger Vital Sign    Worried About Running Out of Food in the Last Year: Never true    Ran Out of Food in the Last Year: Never true  Transportation Needs: No Transportation Needs (08/01/2022)   PRAPARE - Administrator, Civil Service (Medical): No    Lack of Transportation (Non-Medical): No  Physical Activity: Sufficiently Active (08/01/2022)   Exercise Vital Sign    Days of Exercise per Week: 5 days    Minutes of Exercise per Session: 30 min  Stress: No Stress Concern Present (08/01/2022)   [employer] of Occupational Health - Occupational Stress Questionnaire    Feeling of Stress : Not at all  Social Connections: Socially Integrated (08/01/2022)   Social Connection and Isolation Panel [NHANES]    Frequency of Communication with Friends and Family: More than three times a week    Frequency of Social Gatherings with Friends and Family: More than three times a week    Attends Religious Services: More than 4 times per year    Active Member of [employer] or Organizations: Yes    Attends Occupational hygienist Meetings: 1 to 4 times per year    Marital Status: Married    Tobacco Counseling Counseling given: Not Answered   Clinical Intake:  Pre-visit preparation completed: Yes  Pain : No/denies pain     BMI - recorded: 40.32 Nutritional Status: BMI > 30  Obese Nutritional Risks: None Diabetes: Yes CBG done?: Yes (124 per pt) CBG resulted in Enter/ Edit results?: No Did pt. bring in CBG monitor from home?: No  How often do you need to have someone help you when you read instructions, pamphlets, or other written materials from your doctor or pharmacy?: 1 - Never  Diabetic?Nutrition Risk Assessment:  Has the patient had any N/V/D within the last 2 months?  No  Does the patient have any non-healing wounds?  No  Has the patient had any unintentional weight loss or weight gain?  No   Diabetes:  Is the patient diabetic?  Yes  If diabetic, was a CBG obtained today?  Yes  Did the patient bring in their glucometer from home?  No  How often do you monitor your CBG's? Daily .   Financial Strains and Diabetes Management:  Are you having any financial strains with the device, your supplies or your medication? No .  Does the patient want to be seen by Chronic Care Management for management of their diabetes?  No  Would the patient like to be referred to a Nutritionist or for Diabetic Management?  No   Diabetic Exams:  Diabetic Eye Exam: Completed 05/19/22 Diabetic Foot Exam: Overdue, Pt has been advised about the importance in completing this exam. Pt is scheduled for diabetic foot exam on next appt .   Interpreter Needed?: No  Information entered by :: Lanier Ensign, LPN   Activities of Daily Living    08/01/2022    8:46 AM  In your present state of health, do you have any difficulty performing the following  activities:  Hearing? 0  Vision? 0  Difficulty concentrating or making decisions? 0  Walking or climbing stairs? 1  Comment use hand rail  Dressing or  bathing? 0  Doing errands, shopping? 0  Preparing Food and eating ? N  Using the Toilet? N  In the past six months, have you accidently leaked urine? Y  Comment urge incontinence  Do you have problems with loss of bowel control? N  Managing your Medications? N  Managing your Finances? N  Housekeeping or managing your Housekeeping? N    Patient Care Team: Natalia Leatherwood, DO as PCP - General (Family Medicine) Resa Miner, DPM as Referring Physician (Podiatry) Nadara Mustard, MD as Consulting Physician (Orthopedic Surgery) Dietels, Delanna Notice, OD (Optometry)  Indicate any recent Medical Services you may have received from other than [facility] providers in the past year (date may be approximate).     Assessment:   This is a routine wellness examination for Julie Osborne.  Hearing/Vision screen Hearing Screening - Comments:: Pt denies any hearing issues   Vision Screening - Comments:: Pt follows up with Dr Roma Kayser for annual eye exams   Dietary issues and exercise activities discussed: Current Exercise Habits: Home exercise routine, Type of exercise: Other - see comments, Time (Minutes): 30, Frequency (Times/Week): 5, Weekly Exercise (Minutes/Week): 150   Goals Addressed             This Visit's Progress    Patient Stated       Diet and education to get gastric sleeve        Depression Screen    08/01/2022    8:41 AM 06/25/2022   11:33 AM 07/08/2021    9:41 AM 03/24/2021    8:27 AM 08/16/2020    8:45 AM 03/30/2020    9:31 AM 02/24/2020    9:19 AM  PHQ 2/9 Scores  PHQ - 2 Score 0 0 0 0 1 1 0  PHQ- 9 Score     3 16     Fall Risk    08/01/2022    8:45 AM 06/25/2022   11:33 AM 04/16/2022    8:33 AM 07/08/2021    9:42 AM 03/24/2021    8:26 AM  Fall Risk   Falls in the past year? 1 0 1 1 0  Number falls in past yr: 1 0 0 0 0  Injury with Fall? 1 0 1 0 0  Comment hit head in tub      Risk for fall due to : Impaired balance/gait;Impaired mobility;Impaired vision No Fall Risks  Impaired balance/gait No Fall Risks No Fall Risks  Follow up Falls prevention discussed Falls evaluation completed Falls evaluation completed Falls evaluation completed Falls evaluation completed    FALL RISK PREVENTION PERTAINING TO THE HOME:  Any stairs in or around the home? Yes  If so, are there any without handrails? No  Home free of loose throw rugs in walkways, pet beds, electrical cords, etc? Yes  Adequate lighting in your home to reduce risk of falls? Yes   ASSISTIVE DEVICES UTILIZED TO PREVENT FALLS:  Life alert? No  Use of a cane, walker or w/c? Yes  Grab bars in the bathroom? Yes  Shower chair or bench in shower? Yes  Elevated toilet seat or a handicapped toilet? No   TIMED UP AND GO:  Was the test performed? No .   Cognitive Function:        08/01/2022    8:47 AM 07/08/2021  9:52 AM  6CIT Screen  What Year? 0 points 0 points  What month? 0 points 0 points  What time? 0 points 0 points  Count back from 20 0 points 0 points  Months in reverse 0 points 0 points  Repeat phrase 0 points 0 points  Total Score 0 points 0 points    Immunizations Immunization History  Administered Date(s) Administered   Fluad Quad(high Dose 65+) 12/04/2018, 12/24/2019, 01/04/2021   Influenza,inj,Quad PF,6+ Mos 01/13/2016, 12/25/2016   MMR 02/11/2020   PFIZER(Purple Top)SARS-COV-2 Vaccination 01/30/2020, 02/20/2020   Pneumococcal Conjugate-13 01/07/2006, 06/08/2019   Pneumococcal Polysaccharide-23 02/26/2018   Td 01/21/2011   Tdap 02/11/2020   Zoster Recombinat (Shingrix) 08/13/2016, 12/25/2016   Zoster, Live 04/09/2012    TDAP status: Up to date  Flu Vaccine status: Due, Education has been provided regarding the importance of this vaccine. Advised may receive this vaccine at local pharmacy or Health Dept. Aware to provide a copy of the vaccination record if obtained from local pharmacy or Health Dept. Verbalized acceptance and understanding.  Pneumococcal vaccine status:  Up to date  Covid-19 vaccine status: Completed vaccines  Qualifies for Shingles Vaccine? Yes   Zostavax completed Yes   Shingrix Completed?: Yes  Screening Tests Health Maintenance  Topic Date Due   FOOT EXAM  07/07/2022   Hepatitis C Screening  04/17/2023 (Originally 11/26/1968)   HEMOGLOBIN A1C  10/15/2022   INFLUENZA VACCINE  11/08/2022   Diabetic kidney evaluation - Urine ACR  02/13/2023   Diabetic kidney evaluation - eGFR measurement  04/17/2023   OPHTHALMOLOGY EXAM  05/20/2023   COLONOSCOPY (Pts 45-66yrs Insurance coverage will need to be confirmed)  06/09/2023   Medicare Annual Wellness (AWV)  08/01/2023   MAMMOGRAM  09/16/2023   DTaP/Tdap/Td (3 - Td or Tdap) 02/10/2030   Pneumonia Vaccine 18+ Years old  Completed   DEXA SCAN  Completed   Zoster Vaccines- Shingrix  Completed   HPV VACCINES  Aged Out   COVID-19 Vaccine  Discontinued    Health Maintenance  Health Maintenance Due  Topic Date Due   FOOT EXAM  07/07/2022    Colorectal cancer screening: Type of screening: Colonoscopy. Completed 06/08/20. Repeat every 3 years  Mammogram status: Completed 09/15/21. Repeat every year  Bone Density status: Completed 04/28/18. Results reflect: Bone density results: NORMAL. Repeat every 2 years.  Additional Screening:  Hepatitis C Screening: does qualify  Vision Screening: Recommended annual ophthalmology exams for early detection of glaucoma and other disorders of the eye. Is the patient up to date with their annual eye exam?  Yes  Who is the provider or what is the name of the office in which the patient attends annual eye exams? Dr Roma Kayser If pt is not established with a provider, would they like to be referred to a provider to establish care? No .   Dental Screening: Recommended annual dental exams for proper oral hygiene  Community Resource Referral / Chronic Care Management: CRR required this visit?  No   CCM required this visit?  No      Plan:     I have  personally reviewed and noted the following in the patient's chart:   Medical and social history Use of alcohol, tobacco or illicit drugs  Current medications and supplements including opioid prescriptions. Patient is not currently taking opioid prescriptions. Functional ability and status Nutritional status Physical activity Advanced directives List of other physicians Hospitalizations, surgeries, and ER visits in previous 12 months Vitals Screenings to include cognitive,  depression, and falls Referrals and appointments  In addition, I have reviewed and discussed with patient certain preventive protocols, quality metrics, and best practice recommendations. A written personalized care plan for preventive services as well as general preventive health recommendations were provided to patient.     Marzella Schlein, LPN   05/23/863   Nurse Notes: none

## 2022-08-01 NOTE — Patient Instructions (Signed)
Julie Osborne , Thank you for taking time to come for your Medicare Wellness Visit. I appreciate your ongoing commitment to your health goals. Please review the following plan we discussed and let me know if I can assist you in the future.   These are the goals we discussed:  Goals      Monitor and Manage My Blood Sugar-Diabetes Type 2     Timeframe:  Short-Term Goal Priority:  Medium Start Date:   07/08/20                          Expected End Date:         09/07/20             - enter blood sugar readings and medication or insulin into daily log    Why is this important?   Checking your blood sugar at home helps to keep it from getting very high or very low.  Writing the results in a diary or log helps the doctor know how to care for you.  Your blood sugar log should have the time, date and the results.  Also, write down the amount of insulin or other medicine that you take.  Other information, like what you ate, exercise done and how you were feeling, will also be helpful.     Notes:      Patient Stated     Eat healthier , increase activity & continue to drink plenty of water        This is a list of the screening recommended for you and due dates:  Health Maintenance  Topic Date Due   Complete foot exam   07/07/2022   Hepatitis C Screening: USPSTF Recommendation to screen - Ages 18-79 yo.  04/17/2023*   Hemoglobin A1C  10/15/2022   Flu Shot  11/08/2022   Yearly kidney health urinalysis for diabetes  02/13/2023   Yearly kidney function blood test for diabetes  04/17/2023   Eye exam for diabetics  05/20/2023   Colon Cancer Screening  06/09/2023   Medicare Annual Wellness Visit  08/01/2023   Mammogram  09/16/2023   DTaP/Tdap/Td vaccine (3 - Td or Tdap) 02/10/2030   Pneumonia Vaccine  Completed   DEXA scan (bone density measurement)  Completed   Zoster (Shingles) Vaccine  Completed   HPV Vaccine  Aged Out   COVID-19 Vaccine  Discontinued  *Topic was postponed. The date  shown is not the original due date.    Advanced directives: Please bring a copy of your health care power of attorney and living will to the office at your convenience.  Conditions/risks identified: Diet and education to get gastric sleeve   Next appointment: Follow up in one year for your annual wellness visit    Preventive Care 65 Years and Older, Female Preventive care refers to lifestyle choices and visits with your health care provider that can promote health and wellness. What does preventive care include? A yearly physical exam. This is also called an annual well check. Dental exams once or twice a year. Routine eye exams. Ask your health care provider how often you should have your eyes checked. Personal lifestyle choices, including: Daily care of your teeth and gums. Regular physical activity. Eating a healthy diet. Avoiding tobacco and drug use. Limiting alcohol use. Practicing safe sex. Taking low-dose aspirin every day. Taking vitamin and mineral supplements as recommended by your health care provider. What happens during an annual well  check? The services and screenings done by your health care provider during your annual well check will depend on your age, overall health, lifestyle risk factors, and family history of disease. Counseling  Your health care provider Raspberry ask you questions about your: Alcohol use. Tobacco use. Drug use. Emotional well-being. Home and relationship well-being. Sexual activity. Eating habits. History of falls. Memory and ability to understand (cognition). Work and work Statistician. Reproductive health. Screening  You Person have the following tests or measurements: Height, weight, and BMI. Blood pressure. Lipid and cholesterol levels. These Speckman be checked every 5 years, or more frequently if you are over 69 years old. Skin check. Lung cancer screening. You Sassone have this screening every year starting at age 55 if you have a  30-pack-year history of smoking and currently smoke or have quit within the past 15 years. Fecal occult blood test (FOBT) of the stool. You Fitzpatrick have this test every year starting at age 61. Flexible sigmoidoscopy or colonoscopy. You Martorana have a sigmoidoscopy every 5 years or a colonoscopy every 10 years starting at age 30. Hepatitis C blood test. Hepatitis B blood test. Sexually transmitted disease (STD) testing. Diabetes screening. This is done by checking your blood sugar (glucose) after you have not eaten for a while (fasting). You Loppnow have this done every 1-3 years. Bone density scan. This is done to screen for osteoporosis. You Oshana have this done starting at age 48. Mammogram. This Stakes be done every 1-2 years. Talk to your health care provider about how often you should have regular mammograms. Talk with your health care provider about your test results, treatment options, and if necessary, the need for more tests. Vaccines  Your health care provider Potier recommend certain vaccines, such as: Influenza vaccine. This is recommended every year. Tetanus, diphtheria, and acellular pertussis (Tdap, Td) vaccine. You Marcil need a Td booster every 10 years. Zoster vaccine. You Savary need this after age 75. Pneumococcal 13-valent conjugate (PCV13) vaccine. One dose is recommended after age 3. Pneumococcal polysaccharide (PPSV23) vaccine. One dose is recommended after age 21. Talk to your health care provider about which screenings and vaccines you need and how often you need them. This information is not intended to replace advice given to you by your health care provider. Make sure you discuss any questions you have with your health care provider. Document Released: 04/22/2015 Document Revised: 12/14/2015 Document Reviewed: 01/25/2015 Elsevier Interactive Patient Education  2017 [place] Prevention in the Home Falls can cause injuries. They can happen to people of all ages. There are many  things you can do to make your home safe and to help prevent falls. What can I do on the outside of my home? Regularly fix the edges of walkways and driveways and fix any cracks. Remove anything that might make you trip as you walk through a door, such as a raised step or threshold. Trim any bushes or trees on the path to your home. Use bright outdoor lighting. Clear any walking paths of anything that might make someone trip, such as rocks or tools. Regularly check to see if handrails are loose or broken. Make sure that both sides of any steps have handrails. Any raised decks and porches should have guardrails on the edges. Have any leaves, snow, or ice cleared regularly. Use sand or salt on walking paths during winter. Clean up any spills in your garage right away. This includes oil or grease spills. What can I do in  the bathroom? Use night lights. Install grab bars by the toilet and in the tub and shower. Do not use towel bars as grab bars. Use non-skid mats or decals in the tub or shower. If you need to sit down in the shower, use a plastic, non-slip stool. Keep the floor dry. Clean up any water that spills on the floor as soon as it happens. Remove soap buildup in the tub or shower regularly. Attach bath mats securely with double-sided non-slip rug tape. Do not have throw rugs and other things on the floor that can make you trip. What can I do in the bedroom? Use night lights. Make sure that you have a light by your bed that is easy to reach. Do not use any sheets or blankets that are too big for your bed. They should not hang down onto the floor. Have a firm chair that has side arms. You can use this for support while you get dressed. Do not have throw rugs and other things on the floor that can make you trip. What can I do in the kitchen? Clean up any spills right away. Avoid walking on wet floors. Keep items that you use a lot in easy-to-reach places. If you need to reach  something above you, use a strong step stool that has a grab bar. Keep electrical cords out of the way. Do not use floor polish or wax that makes floors slippery. If you must use wax, use non-skid floor wax. Do not have throw rugs and other things on the floor that can make you trip. What can I do with my stairs? Do not leave any items on the stairs. Make sure that there are handrails on both sides of the stairs and use them. Fix handrails that are broken or loose. Make sure that handrails are as long as the stairways. Check any carpeting to make sure that it is firmly attached to the stairs. Fix any carpet that is loose or worn. Avoid having throw rugs at the top or bottom of the stairs. If you do have throw rugs, attach them to the floor with carpet tape. Make sure that you have a light switch at the top of the stairs and the bottom of the stairs. If you do not have them, ask someone to add them for you. What else can I do to help prevent falls? Wear shoes that: Do not have high heels. Have rubber bottoms. Are comfortable and fit you well. Are closed at the toe. Do not wear sandals. If you use a stepladder: Make sure that it is fully opened. Do not climb a closed stepladder. Make sure that both sides of the stepladder are locked into place. Ask someone to hold it for you, if possible. Clearly mark and make sure that you can see: Any grab bars or handrails. First and last steps. Where the edge of each step is. Use tools that help you move around (mobility aids) if they are needed. These include: Canes. Walkers. Scooters. Crutches. Turn on the lights when you go into a dark area. Replace any light bulbs as soon as they burn out. Set up your furniture so you have a clear path. Avoid moving your furniture around. If any of your floors are uneven, fix them. If there are any pets around you, be aware of where they are. Review your medicines with your doctor. Some medicines can make you  feel dizzy. This can increase your chance of falling. Ask your doctor  what other things that you can do to help prevent falls. This information is not intended to replace advice given to you by your health care provider. Make sure you discuss any questions you have with your health care provider. Document Released: 01/20/2009 Document Revised: 09/01/2015 Document Reviewed: 04/30/2014 Elsevier Interactive Patient Education  2017 [employer].

## 2022-09-12 ENCOUNTER — Other Ambulatory Visit: Payer: Self-pay | Admitting: Family Medicine

## 2022-09-13 ENCOUNTER — Other Ambulatory Visit: Payer: Self-pay

## 2022-09-13 NOTE — Telephone Encounter (Signed)
RF request for amlodipine LOV: 07/12/22, acute Next ov: 10/01/22 Last written: 04/16/22 (90,1) [facility]

## 2022-10-01 ENCOUNTER — Ambulatory Visit: Payer: Medicare Other | Admitting: Family Medicine

## 2022-10-08 ENCOUNTER — Ambulatory Visit: Payer: Medicare Other | Admitting: Family Medicine

## 2022-10-09 ENCOUNTER — Ambulatory Visit: Payer: Medicare Other | Admitting: Family Medicine

## 2022-10-22 ENCOUNTER — Telehealth: Payer: Self-pay

## 2022-10-22 NOTE — Transitions of Care (Post Inpatient/ED Visit) (Signed)
10/22/2022  Name: Julie Osborne MRN: [id number] DOB: [birth year]  Today's TOC FU Call Status: Today's TOC FU Call Status:: Successful TOC FU Call Competed TOC FU Call Complete Date: 10/22/22  Transition Care Management Follow-up Telephone Call Date of Discharge: 10/19/22 Discharge Facility: Other ([facility]) Name of Other ([facility]) Discharge Facility: [facility] Type of Discharge: Inpatient Admission Primary Inpatient Discharge Diagnosis:: severe obesity- PR LAP GASTRIC BYPASS How have you been since you were released from the hospital?: Better Any questions or concerns?: Yes Patient Questions/Concerns:: (S) med concerns - surgeons stopped some that has dropped her BP Patient Questions/Concerns Addressed: Notified Provider of Patient Questions/Concerns  Items Reviewed: Did you receive and understand the discharge instructions provided?: Yes Medications obtained,verified, and reconciled?: Yes (Medications Reviewed) Any new allergies since your discharge?: No Dietary orders reviewed?: Yes Do you have support at home?: Yes  Medications Reviewed Today: Medications Reviewed Today     Reviewed by Merleen Nicely, LPN (Licensed Practical Nurse) on 10/22/22 at 1003  Med List Status: <None>   Medication Order Taking? Sig Documenting Provider Last Dose Status Informant  albuterol (VENTOLIN HFA) 108 (90 Base) MCG/ACT inhaler [id number] Yes Inhale 1-2 puffs into the lungs every 6 (six) hours as needed for wheezing or shortness of breath. Kuneff, Renee A, DO Taking Active   amLODipine (NORVASC) 10 MG tablet [id number] No Take 1 tablet (10 mg total) by mouth daily.  Patient not taking: Reported on 10/22/2022   Felix Pacini A, DO Not Taking Active   Ascorbic Acid (VITAMIN C) 100 MG tablet [id number] No Take 1 tablet by mouth daily.   Patient not taking: Reported on 10/22/2022   [provider] Not Taking Active   aspirin 81 MG chewable tablet [id number] Yes Chew by mouth daily.   [provider] Taking Active   calcium citrate-vitamin D (CITRACAL+D) 315-200 MG-UNIT tablet [id number] No Take 1 tablet by mouth daily.   Patient not taking: Reported on 10/22/2022   [provider] Not Taking Active   Calcium Polycarbophil (FIBER-CAPS PO) [id number] No Take by mouth.   Patient not taking: Reported on 10/22/2022   [provider] Not Taking Active   Cholecalciferol (VITAMIN D3) 75 MCG (3000 UT) TABS [id number] No Take by mouth. 2,000 units daily  Patient not taking: Reported on 10/22/2022   [provider] Not Taking Active   Cobalamin Combinations (VITAMIN B12-FOLIC ACID PO) [id number] No Take 1 tablet by mouth daily.   Patient not taking: Reported on 08/01/2022   [provider] Not Taking Active   Coenzyme Q10 (COQ10) 100 MG CAPS [id number] No Take 1 tablet by mouth daily.   Patient not taking: Reported on 10/22/2022   [provider] Not Taking Active   diclofenac Sodium (VOLTAREN) 1 % GEL [id number] No Apply topically 4 times daily PRN  Patient not taking: Reported on 10/22/2022   Felix Pacini A, DO Not Taking Active   doxycycline (VIBRA-TABS) 100 MG tablet [id number] No Take 1 tablet (100 mg total) by mouth 2 (two) times daily.  Patient not taking: Reported on 10/22/2022   Felix Pacini A, DO Not Taking Active   DULoxetine (CYMBALTA) 20 MG capsule [id number] No Take 1 capsule (20 mg total) by mouth daily.  Patient not taking: Reported on 10/22/2022   Felix Pacini A, DO Not Taking Active   fluticasone (FLONASE) 50 MCG/ACT nasal spray [id number] Yes Place 2 sprays into both nostrils daily. Kuneff, Renee A, DO Taking Active   HUMALOG KWIKPEN 100 UNIT/ML KwikPen  [id number] Yes Inject into the skin 3 (three) times daily. [provider] Taking Active   Magnesium 400 MG CAPS [id number] No Take 1 tablet by mouth daily.   Patient not taking: Reported on 10/22/2022   [provider] Not Taking Active   Menaquinone-7  (VITAMIN K2) 100 MCG CAPS [id number] No Take 1 tablet by mouth daily.   Patient not taking: Reported on 10/22/2022   [provider] Not Taking Active   metFORMIN (GLUCOPHAGE) 1000 MG tablet [id number] No TAKE 1 TABLET (1,000 MG TOTAL) BY MOUTH TWICE A DAY WITH FOOD  Patient not taking: Reported on 10/22/2022   Felix Pacini A, DO Not Taking Active   metoprolol succinate (TOPROL-XL) 100 MG 24 hr tablet [id number] Yes TAKE 1 TABLET BY MOUTH EVERY DAY Kuneff, Renee A, DO Taking Active   Misc Natural Products (TART CHERRY ADVANCED PO) [id number] No Take by mouth.   Patient not taking: Reported on 10/22/2022   [provider] Not Taking Active   Multiple Vitamin (MULTIVITAMIN) capsule [id number] Yes Take 1 capsule by mouth daily.  [provider] Taking Active   nitroGLYCERIN (NITROSTAT) 0.4 MG SL tablet [id number] Yes Place under the tongue.  [provider] Taking Active   rosuvastatin (CRESTOR) 20 MG tablet [id number] Yes Take 1 tablet (20 mg total) by mouth daily. Kuneff, Renee A, DO Taking Active   tirzepatide [facility]) 12.5 MG/0.5ML Pen [id number] No Inject into the skin.  Patient not taking: Reported on 10/22/2022   [provider] Not Taking Active   tiZANidine (ZANAFLEX) 4 MG tablet [id number] Yes Take 1 tablet (4 mg total) by mouth every 6 (six) hours as needed for muscle spasms. Kuneff, Renee A, DO Taking Active   TOUJEO MAX SOLOSTAR 300 UNIT/ML Solostar Pen [id number] Yes 90 unit sub q [provider] Taking Active            Med Note [name], Biana Haggar H   Mon Oct 22, 2022 10:01 AM) Pt taking 20units    valsartan-hydrochlorothiazide (DIOVAN-HCT) 320-25 MG tablet [id number] Yes Take 1 tablet by mouth daily. Kuneff, Renee A, DO Taking Active   Zinc 25 MG TABS [id number] No Take 1 tablet by mouth daily.   Patient not taking: Reported on 10/22/2022   [provider] Not Taking Active             Home Care and Equipment/Supplies: Were  Home Health Services Ordered?: No Any new equipment or medical supplies ordered?: No  Functional Questionnaire: Do you need assistance with bathing/showering or dressing?: No Do you need assistance with meal preparation?: No Do you need assistance with eating?: No Do you have difficulty maintaining continence: No Do you need assistance with getting out of bed/getting out of a chair/moving?: No Do you have difficulty managing or taking your medications?: No  Follow up appointments reviewed: PCP Follow-up appointment confirmed?: Yes Date of PCP follow-up appointment?: 10/24/22 Follow-up Provider: Felix Pacini DO Specialist Hospital Follow-up appointment confirmed?: Yes Date of Specialist follow-up appointment?: 10/31/22 Follow-Up Specialty Provider:: surgeons Do you need transportation to your follow-up appointment?: No Do you understand care options if your condition(s) worsen?: Yes-patient verbalized understanding    SIGNATURE  Woodfin Ganja LPN [facility] Nurse Health Advisor Direct Dial [phone]

## 2022-10-24 ENCOUNTER — Ambulatory Visit (INDEPENDENT_AMBULATORY_CARE_PROVIDER_SITE_OTHER): Payer: Medicare Other | Admitting: Family Medicine

## 2022-10-24 ENCOUNTER — Encounter: Payer: Self-pay | Admitting: Family Medicine

## 2022-10-24 VITALS — BP 140/80 | HR 73 | Temp 97.6°F | Wt 275.8 lb

## 2022-10-24 DIAGNOSIS — I10 Essential (primary) hypertension: Secondary | ICD-10-CM | POA: Diagnosis not present

## 2022-10-24 DIAGNOSIS — E782 Mixed hyperlipidemia: Secondary | ICD-10-CM | POA: Diagnosis not present

## 2022-10-24 DIAGNOSIS — M5416 Radiculopathy, lumbar region: Secondary | ICD-10-CM | POA: Diagnosis not present

## 2022-10-24 DIAGNOSIS — Z6839 Body mass index (BMI) 39.0-39.9, adult: Secondary | ICD-10-CM

## 2022-10-24 MED ORDER — METOPROLOL SUCCINATE ER 100 MG PO TB24: 100.0000 mg | ORAL_TABLET | Freq: Every day | ORAL | 2 refills | Status: DC

## 2022-10-24 MED ORDER — ROSUVASTATIN CALCIUM 20 MG PO TABS: 20.0000 mg | ORAL_TABLET | Freq: Every day | ORAL | 3 refills | Status: DC

## 2022-10-24 MED ORDER — AMLODIPINE BESYLATE 2.5 MG PO TABS: 2.5000 mg | ORAL_TABLET | Freq: Every day | ORAL | 1 refills | Status: DC

## 2022-10-24 MED ORDER — TIZANIDINE HCL 4 MG PO TABS: 4.0000 mg | ORAL_TABLET | Freq: Four times a day (QID) | ORAL | 5 refills | Status: DC | PRN

## 2022-10-24 MED ORDER — VALSARTAN 320 MG PO TABS: 320.0000 mg | ORAL_TABLET | Freq: Every day | ORAL | 3 refills | Status: DC

## 2022-10-24 NOTE — Progress Notes (Signed)
Julie Osborne , 08-Jan-1951, 72 y.o., female MRN: [id number] Patient Care Team    Relationship Specialty Notifications Start End  Natalia Leatherwood, DO PCP - General Family Medicine  01/13/16   Resa Miner, [place] Referring Physician Podiatry  12/24/19   Nadara Mustard, MD Consulting Physician Orthopedic Surgery  08/16/20   Dietels, Delanna Notice, OD  Optometry  07/08/21     Chief Complaint  Patient presents with   Hypertension    Follow up on BP and meds     Subjective:Julie Osborne is a 72 y.o. female patient present for Chronic Conditions/illness Management after recent lap band procedure.   HTN/hyperlipidemia: Pt underwent reports compliance with   metoprolol 100 mg daily, Crestor. Recent bariatric surgery- other meds stopped . amlodipine 10, valsartan-hctz 10-320-25 Patient denies chest pain, shortness of breath, dizziness or lower extremity edema.       10/24/2022   10:37 AM 08/01/2022    8:41 AM 06/25/2022   11:33 AM 07/08/2021    9:41 AM 03/24/2021    8:27 AM  Depression screen PHQ 2/9  Decreased Interest 0 0 0 0 0  Down, Depressed, Hopeless 0 0 0 0 0  PHQ - 2 Score 0 0 0 0 0  Altered sleeping 0      Tired, decreased energy 0      Change in appetite 0      Feeling bad or failure about yourself  0      Trouble concentrating 0      Moving slowly or fidgety/restless 0      Suicidal thoughts 0      PHQ-9 Score 0      Difficult doing work/chores Not difficult at all        Allergies  Allergen Reactions   Statins     Lipitor,simvatatin, pravastatin.welchol cause myalgia   Social History   Social History Narrative   Married to [employer], 2 children.    BS, RN retired now Agricultural consultant.    Drinks caffeine, uses herbal remedies. Daily vitmain use.   Wears her seatbelt, smoke detector in the home.    exercises routinely.    She feels safe in her relationships.    Past Medical History:  Diagnosis Date   Apnea    Closed fracture of neck of left radius 10/14/2017    Concussion    Degenerative arthritis    generalized/back/LEFT foot   Diabetic neuropathy (HCC) 2017   Diverticulosis 06/19/2013   sigmoid colon on colonoscopy   Hyperlipidemia 2000   on meds   Hypertension, benign 1996   on meds   Lumbar radiculopathy    Severe L4/L5 radiculopathy by old records.    Pes planus 12/17/2011   Posterior tibial tendon dysfunction (PTTD) of left lower extremity 04/10/2015   post tibial tendon dysfunction stage 5 (PTTD)   Tenosynovitis of ankle 2017   post tibial tendon dysfunction stage 5 (PTTD)   Uncontrolled diabetes mellitus 2005   on meds   Past Surgical History:  Procedure Laterality Date   CORONARY ANGIOPLASTY WITH STENT PLACEMENT  10/17/2018   DES x2/LAD   EXPLORATORY LAPAROTOMY  1976   endometriosis   MINOR HEMORRHOIDECTOMY  2006   OTHER SURGICAL HISTORY Right 1975   venous ligation   Family History  Problem Relation Age of Onset   COPD Mother    Diabetes Mellitus II Mother    Heart failure Mother    Osteoarthritis Father    Cancer Father  tumor found behind xyphoid process   Diabetes Sister    Diabetes Brother    Dementia Maternal Grandmother    Heart disease Maternal Grandmother    Heart disease Maternal Grandfather    Stroke Maternal Grandfather    Heart failure Maternal Aunt    Lung cancer Paternal Grandfather    Lung cancer Paternal Aunt    Colon polyps Neg Hx    Colon cancer Neg Hx    Esophageal cancer Neg Hx    Stomach cancer Neg Hx    Rectal cancer Neg Hx    Allergies as of 10/24/2022       Reactions   Statins    Lipitor,simvatatin, pravastatin.welchol cause myalgia        Medication List        Accurate as of October 24, 2022  3:47 PM. If you have any questions, ask your nurse or doctor.          STOP taking these medications    aspirin 81 MG chewable tablet Stopped by: Felix Pacini   calcium citrate-vitamin D 315-200 MG-UNIT tablet Commonly known as: CITRACAL+D Stopped by: Felix Pacini    CoQ10 100 MG Caps Stopped by: Felix Pacini   diclofenac Sodium 1 % Gel Commonly known as: Voltaren Stopped by: Felix Pacini   doxycycline 100 MG tablet Commonly known as: VIBRA-TABS Stopped by: Felix Pacini   DULoxetine 20 MG capsule Commonly known as: CYMBALTA Stopped by: Felix Pacini   FIBER-CAPS PO Stopped by: Felix Pacini   Magnesium 400 MG Caps Stopped by: Felix Pacini   metFORMIN 1000 MG tablet Commonly known as: GLUCOPHAGE Stopped by: Felix Pacini   TART CHERRY ADVANCED PO Stopped by: Felix Pacini   tirzepatide 12.5 MG/0.5ML Pen Commonly known as: MOUNJARO Stopped by: Felix Pacini   valsartan-hydrochlorothiazide 320-25 MG tablet Commonly known as: DIOVAN-HCT Stopped by: Felix Pacini   VITAMIN B12-FOLIC ACID PO Stopped by: Felix Pacini   vitamin C 100 MG tablet Stopped by: Felix Pacini   Vitamin D3 75 MCG (3000 UT) Tabs Stopped by: Felix Pacini   Zinc 25 MG Tabs Stopped by: Felix Pacini       TAKE these medications    albuterol 108 (90 Base) MCG/ACT inhaler Commonly known as: VENTOLIN HFA Inhale 1-2 puffs into the lungs every 6 (six) hours as needed for wheezing or shortness of breath.   amLODipine 2.5 MG tablet Commonly known as: NORVASC Take 1 tablet (2.5 mg total) by mouth daily. What changed:  medication strength how much to take Changed by: Felix Pacini   fluticasone 50 MCG/ACT nasal spray Commonly known as: FLONASE Place 2 sprays into both nostrils daily.   HumaLOG KwikPen 100 UNIT/ML KwikPen Generic drug: insulin lispro Inject into the skin 3 (three) times daily.   metoprolol succinate 100 MG 24 hr tablet Commonly known as: TOPROL-XL Take 1 tablet (100 mg total) by mouth daily.   multivitamin capsule Take 1 capsule by mouth daily.   nitroGLYCERIN 0.4 MG SL tablet Commonly known as: NITROSTAT Place under the tongue.   omeprazole 20 MG capsule Commonly known as: PRILOSEC Take 20 mg by mouth daily.   ondansetron 8 MG  tablet Commonly known as: ZOFRAN Take 8 mg by mouth every 8 (eight) hours as needed for nausea or vomiting.   rosuvastatin 20 MG tablet Commonly known as: CRESTOR Take 1 tablet (20 mg total) by mouth daily.   senna-docusate 8.6-50 MG tablet Commonly known as: Senokot-S Take 1 tablet by mouth 2 (two) times  daily.   tiZANidine 4 MG tablet Commonly known as: ZANAFLEX Take 1 tablet (4 mg total) by mouth every 6 (six) hours as needed for muscle spasms.   Toujeo Max SoloStar 300 UNIT/ML Solostar Pen Generic drug: insulin glargine (2 Unit Dial) 90 unit sub q   valsartan 320 MG tablet Commonly known as: DIOVAN Take 1 tablet (320 mg total) by mouth daily. Started by: Felix Pacini   Vitamin K2 100 MCG Caps Take 1 tablet by mouth daily.        All past medical history, surgical history, allergies, family history, immunizations andmedications were updated in the EMR today and reviewed under the history and medication portions of their EMR.     ROS: Negative, with the exception of above mentioned in HPI  Objective:  BP (!) 140/80   Pulse 73   Temp 97.6 F (36.4 C) (Oral)   Wt 275 lb 12.8 oz (125.1 kg)   SpO2 98%   BMI 39.57 kg/m  Body mass index is 39.57 kg/m. Physical Exam Vitals and nursing note reviewed.  Constitutional:      General: She is not in acute distress.    Appearance: Normal appearance. She is obese. She is not ill-appearing, toxic-appearing or diaphoretic.  HENT:     Head: Normocephalic and atraumatic.  Eyes:     General: No scleral icterus.       Right eye: No discharge.        Left eye: No discharge.     Extraocular Movements: Extraocular movements intact.     Conjunctiva/sclera: Conjunctivae normal.     Pupils: Pupils are equal, round, and reactive to light.  Cardiovascular:     Rate and Rhythm: Normal rate and regular rhythm.     Heart sounds: No murmur heard. Pulmonary:     Effort: Pulmonary effort is normal. No respiratory distress.     Breath  sounds: Normal breath sounds. No wheezing, rhonchi or rales.  Musculoskeletal:     Right lower leg: No edema.     Left lower leg: No edema.  Skin:    General: Skin is warm.     Findings: No rash.  Neurological:     Mental Status: She is alert and oriented to person, place, and time. Mental status is at baseline.     Motor: No weakness.     Gait: Gait normal.  Psychiatric:        Mood and Affect: Mood normal.        Behavior: Behavior normal.        Thought Content: Thought content normal.        Judgment: Judgment normal.    Diabetic Foot Exam - Simple   Simple Foot Form Diabetic Foot exam was performed with the following findings: Yes 10/24/2022  3:47 PM  Visual Inspection No deformities, no ulcerations, no other skin breakdown bilaterally: Yes Sensation Testing Intact to touch and monofilament testing bilaterally: Yes Pulse Check Posterior Tibialis and Dorsalis pulse intact bilaterally: Yes Comments      No results found. No results found. No results found for this or any previous visit (from the past 24 hour(s)).   Assessment/Plan: Julie Osborne is a 72 y.o. female present for OV for  Hypertension, benign/HLD/coronary artery disease/allergic to statins Above goal Goal < 130/80 Patient to start monitoring blood pressures after 1 week on valsartan again.  If pressure still above goal she is to also start amlodipine 2.5 mg daily. Dc'd hydrochlorothiazide portion of valsartan (bariatric surgery) Continue  valsartan 320 mg daily (started yesterday) Continue metoprolol to 100 mg  Dc'd ASA 81(bariatric) Statin allergy - low sodium, exercise.  - routine exercise encouraged.  We will need to follow her closely over the next few months as she loses weight, she hopefully will require less blood pressure medications.  And understands she will continue monitoring blood pressures and if she is having lower than 110 systolic blood pressures with symptoms, she will call so we can  guide her.   Lumbar back pain with radiculopathy affecting lower extremity/Spinal stenosis, lumbar region, without neurogenic claudication stable (Dc'd)Cymbalta to 20 mg daily Continue Zanaflex prescribed flares.    Return in about 14 weeks (around 01/30/2023) for Routine chronic condition follow-up.   Reviewed expectations re: course of current medical issues. Discussed self-management of symptoms. Outlined signs and symptoms indicating need for more acute intervention. Patient verbalized understanding and all questions were answered. Patient received an After-Visit Summary.    No orders of the defined types were placed in this encounter.  Meds ordered this encounter  Medications   amLODipine (NORVASC) 2.5 MG tablet    Sig: Take 1 tablet (2.5 mg total) by mouth daily.    Dispense:  90 tablet    Refill:  1   tiZANidine (ZANAFLEX) 4 MG tablet    Sig: Take 1 tablet (4 mg total) by mouth every 6 (six) hours as needed for muscle spasms.    Dispense:  30 tablet    Refill:  5   rosuvastatin (CRESTOR) 20 MG tablet    Sig: Take 1 tablet (20 mg total) by mouth daily.    Dispense:  90 tablet    Refill:  3   metoprolol succinate (TOPROL-XL) 100 MG 24 hr tablet    Sig: Take 1 tablet (100 mg total) by mouth daily.    Dispense:  30 tablet    Refill:  2   valsartan (DIOVAN) 320 MG tablet    Sig: Take 1 tablet (320 mg total) by mouth daily.    Dispense:  90 tablet    Refill:  3    Referral Orders  No referral(s) requested today    Note is dictated utilizing voice recognition software. Although note has been proof read prior to signing, occasional typographical errors still can be missed. If any questions arise, please do not hesitate to call for verification.   electronically signed by:  Felix Pacini, DO  [facility] - [place]

## 2022-10-24 NOTE — Patient Instructions (Signed)
No follow-ups on file.        Great to see you today.  I have refilled the medication(s) we provide.   If labs were collected, we will inform you of lab results once received either by echart message or telephone call.   - echart message- for normal results that have been seen by the patient already.   - telephone call: abnormal results or if patient has not viewed results in their echart.  

## 2022-12-15 ENCOUNTER — Other Ambulatory Visit: Payer: Self-pay | Admitting: Family Medicine

## 2022-12-28 ENCOUNTER — Other Ambulatory Visit: Payer: Self-pay | Admitting: Family Medicine

## 2022-12-28 ENCOUNTER — Ambulatory Visit
Admission: RE | Admit: 2022-12-28 | Discharge: 2022-12-28 | Disposition: A | Discharge status: Home / Self Care | Payer: Medicare Other | Source: Ambulatory Visit | Attending: Family Medicine | Admitting: Family Medicine

## 2022-12-28 ENCOUNTER — Telehealth: Payer: Self-pay | Admitting: Family Medicine

## 2022-12-28 DIAGNOSIS — Z13820 Encounter for screening for osteoporosis: Secondary | ICD-10-CM

## 2022-12-28 DIAGNOSIS — Z1231 Encounter for screening mammogram for malignant neoplasm of breast: Secondary | ICD-10-CM

## 2022-12-28 DIAGNOSIS — Z780 Asymptomatic menopausal state: Secondary | ICD-10-CM

## 2022-12-28 DIAGNOSIS — M8580 Other specified disorders of bone density and structure, unspecified site: Secondary | ICD-10-CM | POA: Insufficient documentation

## 2022-12-28 DIAGNOSIS — M8589 Other specified disorders of bone density and structure, multiple sites: Secondary | ICD-10-CM

## 2022-12-28 MED ORDER — ALENDRONATE SODIUM 35 MG PO TABS: 35.0000 mg | ORAL_TABLET | ORAL | 11 refills | Status: DC

## 2022-12-28 NOTE — Telephone Encounter (Signed)
Please inform patient her bone density is significantly decreased from prior densities with a T-score  -2.3.  This is in the osteopenic range, but almost in the osteoporosis range. In order to help slow down progression bone density loss, I would recommend starting Fosamax once weekly.  If she is agreeable to start med, I will call this in for her.  Recommendations Routine weightbearing exercise, even walking 15 minutes a day can help. Limit caffeine and alcohol. Adequate calcium and vitamin D intake

## 2022-12-28 NOTE — Addendum Note (Signed)
Addended by: Felix Pacini A on: 12/28/2022 03:56 PM   Modules accepted: Orders

## 2023-01-15 ENCOUNTER — Ambulatory Visit: Payer: Medicare Other

## 2023-01-20 ENCOUNTER — Other Ambulatory Visit: Payer: Self-pay | Admitting: Family Medicine

## 2023-01-30 ENCOUNTER — Ambulatory Visit: Payer: Medicare Other | Admitting: Family Medicine

## 2023-01-30 LAB — HEPATIC FUNCTION PANEL
ALT: 19 U/L (ref 7–35)
AST: 20 (ref 13–35)
Alkaline Phosphatase: 8.4 — AB (ref 25–125)
Bilirubin, Total: 0.6

## 2023-01-30 LAB — CBC AND DIFFERENTIAL
HCT: 42 (ref 36–46)
Hemoglobin: 14 (ref 12.0–16.0)
Neutrophils Absolute: 3.5
Platelets: 220 10*3/uL (ref 150–400)
WBC: 7

## 2023-01-30 LAB — BASIC METABOLIC PANEL
BUN: 10 (ref 4–21)
CO2: 32 — AB (ref 13–22)
Chloride: 101 (ref 99–108)
Creatinine: 0.6 (ref 0.5–1.1)
Glucose: 140
Potassium: 3.7 meq/L (ref 3.5–5.1)
Sodium: 141 (ref 137–147)

## 2023-01-30 LAB — IRON,TIBC AND FERRITIN PANEL
Ferritin: 15
Iron: 62
TIBC: 473

## 2023-01-30 LAB — VITAMIN B12: Vitamin B-12: 558

## 2023-01-30 LAB — COMPREHENSIVE METABOLIC PANEL
Albumin: 4.1 (ref 3.5–5.0)
Calcium: 9.1 (ref 8.7–10.7)
eGFR: >90

## 2023-01-30 LAB — CBC: RBC: 4.84 (ref 3.87–5.11)

## 2023-01-30 LAB — VITAMIN D 25 HYDROXY (VIT D DEFICIENCY, FRACTURES): Vit D, 25-Hydroxy: 39.8

## 2023-01-31 LAB — HEMOGLOBIN A1C: Hemoglobin A1C: 6.9

## 2023-02-04 ENCOUNTER — Ambulatory Visit (INDEPENDENT_AMBULATORY_CARE_PROVIDER_SITE_OTHER): Payer: Medicare Other | Admitting: Family Medicine

## 2023-02-04 VITALS — BP 122/80 | HR 51 | Temp 97.6°F | Wt 243.0 lb

## 2023-02-04 DIAGNOSIS — E782 Mixed hyperlipidemia: Secondary | ICD-10-CM

## 2023-02-04 DIAGNOSIS — M8589 Other specified disorders of bone density and structure, multiple sites: Secondary | ICD-10-CM

## 2023-02-04 DIAGNOSIS — I10 Essential (primary) hypertension: Secondary | ICD-10-CM

## 2023-02-04 DIAGNOSIS — E785 Hyperlipidemia, unspecified: Secondary | ICD-10-CM

## 2023-02-04 DIAGNOSIS — E1169 Type 2 diabetes mellitus with other specified complication: Secondary | ICD-10-CM | POA: Diagnosis not present

## 2023-02-04 DIAGNOSIS — Z23 Encounter for immunization: Secondary | ICD-10-CM | POA: Diagnosis not present

## 2023-02-04 DIAGNOSIS — Z9861 Coronary angioplasty status: Secondary | ICD-10-CM

## 2023-02-04 DIAGNOSIS — I2510 Atherosclerotic heart disease of native coronary artery without angina pectoris: Secondary | ICD-10-CM

## 2023-02-04 LAB — MICROALBUMIN / CREATININE URINE RATIO
Creatinine,U: 46.9 mg/dL
Microalb Creat Ratio: 3.1 mg/g (ref 0.0–30.0)
Microalb, Ur: 1.4 mg/dL (ref 0.0–1.9)

## 2023-02-04 MED ORDER — VALSARTAN 320 MG PO TABS: 320.0000 mg | ORAL_TABLET | Freq: Every day | ORAL | 1 refills | Status: DC

## 2023-02-04 MED ORDER — METOPROLOL SUCCINATE ER 50 MG PO TB24: 50.0000 mg | ORAL_TABLET | Freq: Every day | ORAL | 1 refills | Status: DC

## 2023-02-04 MED ORDER — TIZANIDINE HCL 4 MG PO TABS: 4.0000 mg | ORAL_TABLET | Freq: Four times a day (QID) | ORAL | 5 refills | Status: DC | PRN

## 2023-02-04 NOTE — Progress Notes (Signed)
Julie Osborne , 1950/12/29, 72 y.o., female MRN: [id number] Patient Care Team    Relationship Specialty Notifications Start End  Natalia Leatherwood, DO PCP - General Family Medicine  01/13/16   Resa Miner, [place] Referring Physician Podiatry  12/24/19   Nadara Mustard, MD Consulting Physician Orthopedic Surgery  08/16/20   Dietels, Delanna Notice, OD  Optometry  07/08/21     Chief Complaint  Patient presents with   Hypertension     Subjective:Julie Osborne is a 72 y.o. female patient present for Chronic Conditions/illness Management after recent lap band procedure.   HTN/hyperlipidemia: Pt underwent reports compliance with metoprolol 100 mg daily, Crestor, diovan 320 mg qd. Recent bariatric surgery- other meds stopped . Prior meds; amlodipine 10, valsartan-hctz 10-320-25 Patient denies chest pain, shortness of breath, dizziness or lower extremity edema.       10/24/2022   10:37 AM 08/01/2022    8:41 AM 06/25/2022   11:33 AM 07/08/2021    9:41 AM 03/24/2021    8:27 AM  Depression screen PHQ 2/9  Decreased Interest 0 0 0 0 0  Down, Depressed, Hopeless 0 0 0 0 0  PHQ - 2 Score 0 0 0 0 0  Altered sleeping 0      Tired, decreased energy 0      Change in appetite 0      Feeling bad or failure about yourself  0      Trouble concentrating 0      Moving slowly or fidgety/restless 0      Suicidal thoughts 0      PHQ-9 Score 0      Difficult doing work/chores Not difficult at all        Allergies  Allergen Reactions   Statins     Lipitor,simvatatin, pravastatin.welchol cause myalgia   Social History   Social History Narrative   Married to [employer], 2 children.    BS, RN retired now Agricultural consultant.    Drinks caffeine, uses herbal remedies. Daily vitmain use.   Wears her seatbelt, smoke detector in the home.    exercises routinely.    She feels safe in her relationships.    Past Medical History:  Diagnosis Date   Apnea    Closed fracture of neck of left radius 10/14/2017    Concussion    Degenerative arthritis    generalized/back/LEFT foot   Diabetic neuropathy (HCC) 2017   Diverticulosis 06/19/2013   sigmoid colon on colonoscopy   Hyperlipidemia 2000   on meds   Hypertension, benign 1996   on meds   Lumbar radiculopathy    Severe L4/L5 radiculopathy by old records.    Pes planus 12/17/2011   Posterior tibial tendon dysfunction (PTTD) of left lower extremity 04/10/2015   post tibial tendon dysfunction stage 5 (PTTD)   Situational anxiety 03/30/2020   Tenosynovitis of ankle 2017   post tibial tendon dysfunction stage 5 (PTTD)   Uncontrolled diabetes mellitus 2005   on meds   Past Surgical History:  Procedure Laterality Date   CORONARY ANGIOPLASTY WITH STENT PLACEMENT  10/17/2018   DES x2/LAD   EXPLORATORY LAPAROTOMY  1976   endometriosis   MINOR HEMORRHOIDECTOMY  2006   OTHER SURGICAL HISTORY Right 1975   venous ligation   Family History  Problem Relation Age of Onset   COPD Mother    Diabetes Mellitus II Mother    Heart failure Mother    Osteoarthritis Father    Cancer Father  tumor found behind xyphoid process   Diabetes Sister    Diabetes Brother    Dementia Maternal Grandmother    Heart disease Maternal Grandmother    Heart disease Maternal Grandfather    Stroke Maternal Grandfather    Heart failure Maternal Aunt    Lung cancer Paternal Grandfather    Lung cancer Paternal Aunt    Colon polyps Neg Hx    Colon cancer Neg Hx    Esophageal cancer Neg Hx    Stomach cancer Neg Hx    Rectal cancer Neg Hx    Allergies as of 02/04/2023       Reactions   Statins    Lipitor,simvatatin, pravastatin.welchol cause myalgia        Medication List        Accurate as of February 04, 2023  8:49 AM. If you have any questions, ask your nurse or doctor.          STOP taking these medications    Vitamin K2 100 MCG Caps Stopped by: Felix Pacini       TAKE these medications    albuterol 108 (90 Base) MCG/ACT  inhaler Commonly known as: VENTOLIN HFA Inhale 1-2 puffs into the lungs every 6 (six) hours as needed for wheezing or shortness of breath.   alendronate 35 MG tablet Commonly known as: FOSAMAX Take 1 tablet (35 mg total) by mouth every 7 (seven) days. Take with a full glass of water on an empty stomach.   amLODipine 2.5 MG tablet Commonly known as: NORVASC Take 1 tablet (2.5 mg total) by mouth daily.   fluticasone 50 MCG/ACT nasal spray Commonly known as: FLONASE Place 2 sprays into both nostrils daily.   HumaLOG KwikPen 100 UNIT/ML KwikPen Generic drug: insulin lispro Inject into the skin 3 (three) times daily.   metoprolol succinate 50 MG 24 hr tablet Commonly known as: TOPROL-XL Take 1 tablet (50 mg total) by mouth daily. What changed:  medication strength how much to take Changed by: Felix Pacini   multivitamin capsule Take 1 capsule by mouth daily.   nitroGLYCERIN 0.4 MG SL tablet Commonly known as: NITROSTAT Place under the tongue.   omeprazole 20 MG capsule Commonly known as: PRILOSEC Take 20 mg by mouth daily.   ondansetron 8 MG tablet Commonly known as: ZOFRAN Take 8 mg by mouth every 8 (eight) hours as needed for nausea or vomiting.   rosuvastatin 20 MG tablet Commonly known as: CRESTOR Take 1 tablet (20 mg total) by mouth daily.   senna-docusate 8.6-50 MG tablet Commonly known as: Senokot-S Take 1 tablet by mouth 2 (two) times daily.   tiZANidine 4 MG tablet Commonly known as: ZANAFLEX Take 1 tablet (4 mg total) by mouth every 6 (six) hours as needed for muscle spasms.   Toujeo Max SoloStar 300 UNIT/ML Solostar Pen Generic drug: insulin glargine (2 Unit Dial) Inject into the skin. 45 unit sub q PM   ursodiol 250 MG tablet Commonly known as: ACTIGALL PLEASE SEE ATTACHED FOR DETAILED DIRECTIONS   valsartan 320 MG tablet Commonly known as: DIOVAN Take 1 tablet (320 mg total) by mouth daily.        All past medical history, surgical  history, allergies, family history, immunizations andmedications were updated in the EMR today and reviewed under the history and medication portions of their EMR.     ROS: Negative, with the exception of above mentioned in HPI  Objective:  BP 122/80   Pulse (!) 51   Temp  97.6 F (36.4 C)   Wt 243 lb (110.2 kg)   SpO2 99%   BMI 34.87 kg/m  Body mass index is 34.87 kg/m. Physical Exam Vitals and nursing note reviewed.  Constitutional:      General: She is not in acute distress.    Appearance: Normal appearance. She is not ill-appearing, toxic-appearing or diaphoretic.  HENT:     Head: Normocephalic and atraumatic.  Eyes:     General: No scleral icterus.       Right eye: No discharge.        Left eye: No discharge.     Extraocular Movements: Extraocular movements intact.     Conjunctiva/sclera: Conjunctivae normal.     Pupils: Pupils are equal, round, and reactive to light.  Cardiovascular:     Rate and Rhythm: Normal rate and regular rhythm.     Heart sounds: No murmur heard. Pulmonary:     Effort: Pulmonary effort is normal. No respiratory distress.     Breath sounds: Normal breath sounds. No wheezing, rhonchi or rales.  Musculoskeletal:     Right lower leg: No edema.     Left lower leg: No edema.  Skin:    General: Skin is warm.     Findings: No rash.  Neurological:     Mental Status: She is alert and oriented to person, place, and time. Mental status is at baseline.     Motor: No weakness.     Gait: Gait normal.  Psychiatric:        Mood and Affect: Mood normal.        Behavior: Behavior normal.        Thought Content: Thought content normal.        Judgment: Judgment normal.      No results found. No results found. No results found for this or any previous visit (from the past 24 hour(s)).   Assessment/Plan: Kopelynn Babel is a 72 y.o. female present for OV for  Hypertension, benign/HLD/coronary artery disease/allergic to statins/CAD S/P percutaneous  coronary angioplasty/bradycardia Stable Down 32 lbs from July appt Goal < 130/80 Continue valsartan 320 mg daily  decrease metoprolol to 100 mg > 50 mg (bradycardia) Amlodipine 2.5 mg every day prn Dc'd ASA 81(bariatric) Statin allergy - low sodium, exercise.  - routine exercise encouraged.  We will need to follow her closely over the next few months as she loses weight, she hopefully will require less blood pressure medications.  And understands she will continue monitoring blood pressures and if she is having lower than 110 systolic blood pressures with symptoms, she will call so we can guide her.   Lumbar back pain with radiculopathy affecting lower extremity/Spinal stenosis, lumbar region, without neurogenic claudication stable Continue  Zanaflex prescribed flares.  Prior meds: cymbalta  Osteopenia of multiple sites Continue fosamax 35 mg weekly.  Dexa rpt 2-3 yrs 2026  type 2 diabetes mellitus with hyperlipidemia (HCC) Now managed by endo. Last A1c 6.9   Return in about 15 weeks (around 05/20/2023) for Routine chronic condition follow-up.   Reviewed expectations re: course of current medical issues. Discussed self-management of symptoms. Outlined signs and symptoms indicating need for more acute intervention. Patient verbalized understanding and all questions were answered. Patient received an After-Visit Summary.    Orders Placed This Encounter  Procedures   Flu Vaccine Trivalent High Dose (Fluad)   Hemoglobin A1c   Urine Microalbumin w/creat. ratio   Meds ordered this encounter  Medications   metoprolol succinate (TOPROL-XL) 50  MG 24 hr tablet    Sig: Take 1 tablet (50 mg total) by mouth daily.    Dispense:  90 tablet    Refill:  1   tiZANidine (ZANAFLEX) 4 MG tablet    Sig: Take 1 tablet (4 mg total) by mouth every 6 (six) hours as needed for muscle spasms.    Dispense:  30 tablet    Refill:  5   valsartan (DIOVAN) 320 MG tablet    Sig: Take 1 tablet (320  mg total) by mouth daily.    Dispense:  90 tablet    Refill:  1    Referral Orders  No referral(s) requested today    Note is dictated utilizing voice recognition software. Although note has been proof read prior to signing, occasional typographical errors still can be missed. If any questions arise, please do not hesitate to call for verification.   electronically signed by:  Felix Pacini, DO  [facility] [place]

## 2023-02-04 NOTE — Patient Instructions (Addendum)

## 2023-03-20 ENCOUNTER — Ambulatory Visit: Payer: Medicare Other

## 2023-03-28 ENCOUNTER — Ambulatory Visit (INDEPENDENT_AMBULATORY_CARE_PROVIDER_SITE_OTHER): Payer: Medicare Other | Admitting: Family Medicine

## 2023-03-28 DIAGNOSIS — Z91199 Patient's noncompliance with other medical treatment and regimen due to unspecified reason: Secondary | ICD-10-CM

## 2023-03-28 DIAGNOSIS — R350 Frequency of micturition: Secondary | ICD-10-CM

## 2023-03-28 NOTE — Progress Notes (Signed)
No show

## 2023-04-01 ENCOUNTER — Telehealth: Payer: Self-pay

## 2023-04-01 NOTE — Transitions of Care (Post Inpatient/ED Visit) (Signed)
   04/01/2023  Name: Julie Osborne MRN: [id number] DOB: 08-18-1950  Today's TOC FU Call Status: Today's TOC FU Call Status:: Unsuccessful Call (1st Attempt) Unsuccessful Call (1st Attempt) Date: 04/01/23  Attempted to reach the patient regarding the most recent Inpatient/ED visit.  Follow Up Plan: Additional outreach attempts will be made to reach the patient to complete the Transitions of Care (Post Inpatient/ED visit) call.   Lonia Chimera, RN, BSN, CEN [employer]- Transition of Care Team.  [facility] [phone]

## 2023-04-08 ENCOUNTER — Telehealth: Payer: Self-pay

## 2023-04-08 NOTE — Transitions of Care (Post Inpatient/ED Visit) (Signed)
   04/08/2023  Name: Julie Osborne MRN: [id number] DOB: May 25, 1950  Today's TOC FU Call Status: Today's TOC FU Call Status:: Unsuccessful Call (2nd Attempt) Unsuccessful Call (2nd Attempt) Date: 04/08/23  Attempted to reach the patient regarding the most recent Inpatient/ED visit.  Follow Up Plan: Additional outreach attempts will be made to reach the patient to complete the Transitions of Care (Post Inpatient/ED visit) call.   Lonia Chimera, RN, BSN, CEN [employer]- Transition of Care Team.  [facility] [phone]

## 2023-04-09 ENCOUNTER — Telehealth: Payer: Self-pay

## 2023-04-09 NOTE — Transitions of Care (Post Inpatient/ED Visit) (Signed)
   04/09/2023  Name: Julie Osborne MRN: [id number] DOB: [birth year]  Today's TOC FU Call Status: Today's TOC FU Call Status:: Unsuccessful Call (3rd Attempt) Unsuccessful Call (3rd Attempt) Date: 04/09/23  Attempted to reach the patient regarding the most recent Inpatient/ED visit.  Follow Up Plan: No further outreach attempts will be made at this time. We have been unable to contact the patient.  Alan Ee, RN, BSN, CEN [employer]- Transition of Care Team.  [facility] [phone]

## 2023-04-13 ENCOUNTER — Emergency Department (HOSPITAL_COMMUNITY)
Admission: EM | Admit: 2023-04-13 | Discharge: 2023-04-13 | Disposition: A | Discharge status: Home / Self Care | Payer: Medicare Other | Attending: Emergency Medicine | Admitting: Emergency Medicine

## 2023-04-13 ENCOUNTER — Ambulatory Visit
Admission: EM | Admit: 2023-04-13 | Discharge: 2023-04-13 | Disposition: A | Discharge status: Home / Self Care | Payer: Medicare Other

## 2023-04-13 ENCOUNTER — Encounter (HOSPITAL_COMMUNITY): Payer: Self-pay

## 2023-04-13 ENCOUNTER — Other Ambulatory Visit: Payer: Self-pay

## 2023-04-13 DIAGNOSIS — R300 Dysuria: Secondary | ICD-10-CM | POA: Diagnosis not present

## 2023-04-13 DIAGNOSIS — I10 Essential (primary) hypertension: Secondary | ICD-10-CM | POA: Diagnosis not present

## 2023-04-13 DIAGNOSIS — I2510 Atherosclerotic heart disease of native coronary artery without angina pectoris: Secondary | ICD-10-CM | POA: Insufficient documentation

## 2023-04-13 DIAGNOSIS — N390 Urinary tract infection, site not specified: Secondary | ICD-10-CM | POA: Diagnosis not present

## 2023-04-13 DIAGNOSIS — Z8619 Personal history of other infectious and parasitic diseases: Secondary | ICD-10-CM

## 2023-04-13 LAB — CBC WITH DIFFERENTIAL/PLATELET
Abs Immature Granulocytes: 0.02 10*3/uL (ref 0.00–0.07)
Basophils Absolute: 0.1 10*3/uL (ref 0.0–0.1)
Basophils Relative: 1 %
Eosinophils Absolute: 0.1 10*3/uL (ref 0.0–0.5)
Eosinophils Relative: 1 %
HCT: 40.3 % (ref 36.0–46.0)
Hemoglobin: 12.9 g/dL (ref 12.0–15.0)
Immature Granulocytes: 0 %
Lymphocytes Relative: 26 %
Lymphs Abs: 2.4 10*3/uL (ref 0.7–4.0)
MCH: 29.9 pg (ref 26.0–34.0)
MCHC: 32 g/dL (ref 30.0–36.0)
MCV: 93.5 fL (ref 80.0–100.0)
Monocytes Absolute: 0.6 10*3/uL (ref 0.1–1.0)
Monocytes Relative: 6 %
Neutro Abs: 6.1 10*3/uL (ref 1.7–7.7)
Neutrophils Relative %: 66 %
Platelets: 342 10*3/uL (ref 150–400)
RBC: 4.31 MIL/uL (ref 3.87–5.11)
RDW: 14.6 % (ref 11.5–15.5)
WBC: 9.3 10*3/uL (ref 4.0–10.5)
nRBC: 0 % (ref 0.0–0.2)

## 2023-04-13 LAB — COMPREHENSIVE METABOLIC PANEL
ALT: 17 U/L (ref 0–44)
AST: 18 U/L (ref 15–41)
Albumin: 3.3 g/dL — ABNORMAL LOW (ref 3.5–5.0)
Alkaline Phosphatase: 83 U/L (ref 38–126)
Anion gap: 8 (ref 5–15)
BUN: 8 mg/dL (ref 8–23)
CO2: 28 mmol/L (ref 22–32)
Calcium: 9.1 mg/dL (ref 8.9–10.3)
Chloride: 104 mmol/L (ref 98–111)
Creatinine, Ser: 0.62 mg/dL (ref 0.44–1.00)
GFR, Estimated: >60 mL/min (ref >60)
Glucose, Bld: 180 mg/dL — ABNORMAL HIGH (ref 70–99)
Potassium: 4 mmol/L (ref 3.5–5.1)
Sodium: 140 mmol/L (ref 135–145)
Total Bilirubin: 0.7 mg/dL (ref 0.0–1.2)
Total Protein: 6.5 g/dL (ref 6.5–8.1)

## 2023-04-13 LAB — URINALYSIS, ROUTINE W REFLEX MICROSCOPIC
Bilirubin Urine: NEGATIVE
Glucose, UA: NEGATIVE mg/dL
Ketones, ur: NEGATIVE mg/dL
Nitrite: NEGATIVE
Protein, ur: NEGATIVE mg/dL
Specific Gravity, Urine: 1.008 (ref 1.005–1.030)
WBC, UA: >50 WBC/hpf (ref 0–5)
pH: 6 (ref 5.0–8.0)

## 2023-04-13 MED ORDER — CEFDINIR 300 MG PO CAPS: 300.0000 mg | ORAL_CAPSULE | Freq: Two times a day (BID) | ORAL | 0 refills | Status: AC

## 2023-04-13 MED ORDER — CELECOXIB 200 MG PO CAPS: 200.0000 mg | ORAL_CAPSULE | Freq: Two times a day (BID) | ORAL | 0 refills | Status: DC

## 2023-04-13 MED ORDER — SODIUM CHLORIDE 0.9 % IV SOLN
1.0000 g | Freq: Once | INTRAVENOUS | Status: AC
  Administered 2023-04-13: 1 g via INTRAVENOUS
  Filled 2023-04-13: qty 10

## 2023-04-13 NOTE — ED Triage Notes (Signed)
 Pt arrived POV from home c/o lower abdominal pain and dysuria. PT states this all started on Dec 18th with UTI symptoms she was admitted to a hospital in [place] for a couple days for IV antibiotics. Pt states she finished her antibiotic on 1/2.

## 2023-04-13 NOTE — ED Notes (Signed)
 Patient is being discharged from the Urgent Care and sent to the Emergency Department via Private Vehicle (Spouse) . Per R. Billy RIGGERS, patient is in need of higher level of care due to UTI with recent history of septicemia. Patient is aware and verbalizes understanding of plan of care.  Vitals:   04/13/23 1133  BP: (!) 175/92  Pulse: 74  Resp: 18  Temp: 97.6 F (36.4 C)  SpO2: 98%

## 2023-04-13 NOTE — ED Triage Notes (Signed)
 I went to help my niece in [place] around the 18th-19th of December, on the drive up stopped at [facility] to get AZO due to symptoms, the next day or a few after getting worse, EMS taken to ED for possible stroke and other symptoms they determined it was septicemia, CT Scan done, Admitted for 4 days, given antibiotics IV and D/C with 2000 mg per day or oral medication (oral antibiotics). The pain in lower abd area remains with dysuria and unable to empty bladder completely. No fever known. Last dose of oral antibiotics on 04-10-2022.

## 2023-04-13 NOTE — ED Provider Notes (Signed)
 Patient today for evaluation of possible recurrent bladder infection.  She was seen and admitted for septicemia a few weeks ago and states she just finished oral antibiotics 2 days ago and symptoms are returning.  Given significant illness with rapid progression within the last month recommended further evaluation in the emergency room for stat labs and possible IV antibiotics.  Patient is agreeable with this plan.   Billy Asberry FALCON, PA-C 04/13/23 1148

## 2023-04-13 NOTE — ED Provider Notes (Signed)
 [facility] Provider Note   CSN: [id number] Arrival date & time: 04/13/23  1207     History Chief Complaint  Patient presents with   Abdominal Pain    HPI Julie Osborne is a 73 y.o. female presenting for chief complaint of dysuria.  She is a 73 year old female with a history of hypertension, hyperlipidemia, spinal stenosis, CAD status post PCI.  She states that she was admitted for urosepsis last month and just finished a course of 11 days of 3 times daily Keflex  2 days ago.  Within 24 hours of finishing the Keflex  he started having dysuria.  Today she developed flank pain even after using Azo.  States she comes in for further care and management and to prevent her symptoms from getting as bad as it did last time.  Unfortunately on external review her urine culture did not grow any results.  Patient's recorded medical, surgical, social, medication list and allergies were reviewed in the Snapshot window as part of the initial history.   Review of Systems   Review of Systems  Constitutional:  Positive for fatigue. Negative for chills and fever.  HENT:  Negative for ear pain and sore throat.   Eyes:  Negative for pain and visual disturbance.  Respiratory:  Negative for cough and shortness of breath.   Cardiovascular:  Negative for chest pain and palpitations.  Gastrointestinal:  Negative for abdominal pain and vomiting.  Genitourinary:  Positive for flank pain. Negative for dysuria and hematuria.  Musculoskeletal:  Positive for back pain. Negative for arthralgias.  Skin:  Negative for color change and rash.  Neurological:  Negative for seizures and syncope.  Psychiatric/Behavioral:  Positive for agitation.   All other systems reviewed and are negative.   Physical Exam Updated Vital Signs BP 138/61   Pulse 60   Temp 98.1 F (36.7 C) (Oral)   Resp 16   SpO2 98%  Physical Exam Vitals and nursing note reviewed.  Constitutional:       General: She is not in acute distress.    Appearance: She is well-developed.  HENT:     Head: Normocephalic and atraumatic.  Eyes:     Conjunctiva/sclera: Conjunctivae normal.  Cardiovascular:     Rate and Rhythm: Normal rate and regular rhythm.     Heart sounds: No murmur heard. Pulmonary:     Effort: Pulmonary effort is normal. No respiratory distress.     Breath sounds: Normal breath sounds.  Abdominal:     General: There is no distension.     Palpations: Abdomen is soft.     Tenderness: There is no abdominal tenderness. There is no right CVA tenderness or left CVA tenderness.  Musculoskeletal:        General: No swelling or tenderness. Normal range of motion.     Cervical back: Neck supple.  Skin:    General: Skin is warm and dry.  Neurological:     General: No focal deficit present.     Mental Status: She is alert and oriented to person, place, and time. Mental status is at baseline.     Cranial Nerves: No cranial nerve deficit.      ED Course/ Medical Decision Making/ A&P    Procedures Procedures   Medications Ordered in ED Medications  cefTRIAXone  (ROCEPHIN ) 1 g in sodium chloride  0.9 % 100 mL IVPB (0 g Intravenous Stopped 04/13/23 1640)    Medical Decision Making:    Julie Osborne is a 73  y.o. female who presented to the ED today with dysuria detailed above.     External chart has been reviewed including recent admission from [facility] system. Patient placed on continuous vitals and telemetry monitoring while in ED which was reviewed periodically.   Complete initial physical exam performed, notably the patient  was hemodynamically stable no acute distress.      Reviewed and confirmed nursing documentation for past medical history, family history, social history.    Initial Assessment:   With the patient's presentation of ongoing dysuria, most likely diagnosis is recurrent urinary tract infection resulting in developing pyelonephritis given patient's  reported flank pain. Other diagnoses were considered including (but not limited to) metabolic emergency, anemia, nephrolithiasis. These are considered less likely due to history of present illness and physical exam findings.   This is most consistent with an acute life/limb threatening illness complicated by underlying chronic conditions.  Initial Plan:  Screening labs including CBC and Metabolic panel to evaluate for infectious or metabolic etiology of disease.  Urinalysis with reflex culture ordered to evaluate for UTI or relevant urologic/nephrologic pathology.  Objective evaluation as below reviewed with plan for close reassessment  Initial Study Results:   Laboratory  All laboratory results reviewed without evidence of clinically relevant pathology.   Reassessment and Plan:   Patient observed in emergency room for extended [facility] time.  Vital signs remained stable. Reviewed outside records that demonstrated no growth on her outside urine culture.  Urinalysis grossly infected today that she has no leukocytosis or AKI.  Will treat with first dose Rocephin  and start patient on Omnicef  while current urine culture grows out.  She will need to follow-up with her PCP for ongoing care and management in the outpatient setting.   Disposition:  I have considered need for hospitalization, however, considering all of the above, I believe this patient is stable for discharge at this time.  Patient/family educated about specific return precautions for given chief complaint and symptoms.  Patient/family educated about follow-up with PCP.     Patient/family expressed understanding of return precautions and need for follow-up. Patient spoken to regarding all imaging and laboratory results and appropriate follow up for these results. All education provided in verbal form with additional information in written form. Time was allowed for answering of patient questions. Patient discharged.    Emergency  Department Medication Summary:   Medications  cefTRIAXone  (ROCEPHIN ) 1 g in sodium chloride  0.9 % 100 mL IVPB (0 g Intravenous Stopped 04/13/23 1640)         Clinical Impression:  1. Lower urinary tract infectious disease      Discharge   Final Clinical Impression(s) / ED Diagnoses Final diagnoses:  Lower urinary tract infectious disease    Rx / DC Orders ED Discharge Orders          Ordered    cefdinir  (OMNICEF ) 300 MG capsule  2 times daily        04/13/23 1553    celecoxib  (CELEBREX ) 200 MG capsule  2 times daily        04/13/23 1803              Jerral Meth, MD 04/13/23 1804

## 2023-04-13 NOTE — ED Provider Triage Note (Signed)
 Emergency Medicine Provider Triage Evaluation Note  Julie Osborne , a 73 y.o. female  was evaluated in triage.  Pt complains of suprapubic pain, dysuria, urgency, frequency.  Following the completion of p.o. antibiotics, patient reports that similar symptoms to previous UTI returned.  She was diagnosed with a UTI and sepsis on 03/27/2023.  Admitted for IV antibiotics.  Review of Systems  Positive: Suprapubic pain, dysuria, urgency, frequency Negative: Fevers  Physical Exam  BP (!) 187/80 (BP Location: Right Arm)   Pulse 71   Temp 98.1 F (36.7 C)   Resp 16   SpO2 100%  Gen:   Awake, no distress   Resp:  Normal effort  MSK:   Moves extremities without difficulty  Other:    Medical Decision Making  Medically screening exam initiated at 2:28 PM.  Appropriate orders placed.  Julie Osborne was informed that the remainder of the evaluation will be completed by another provider, this initial triage assessment does not replace that evaluation, and the importance of remaining in the ED until their evaluation is complete.  Labs ordered   Julie Osborne, Julie Osborne 04/13/23 1429

## 2023-04-13 NOTE — ED Notes (Signed)
 Pt spouse assisted her to restroom

## 2023-04-15 ENCOUNTER — Telehealth: Payer: Self-pay

## 2023-04-15 LAB — URINE CULTURE: Culture: 30000 — AB

## 2023-04-15 NOTE — Transitions of Care (Post Inpatient/ED Visit) (Signed)
   04/15/2023  Name: Julie Osborne MRN: [id number] DOB: 1950-08-23  Today's TOC FU Call Status: Today's TOC FU Call Status:: Successful TOC FU Call Completed Unsuccessful Call (1st Attempt) Date: 04/15/23 Unsuccessful Call (3rd Attempt) Date: 04/13/23 Patient's Name and Date of Birth confirmed.  Transition Care Management Follow-up Telephone Call Date of Discharge: 04/13/23 Discharge Facility: [facility]) Type of Discharge: Emergency Department Reason for ED Visit: Other: How have you been since you were released from the hospital?: Same Any questions or concerns?: No  Items Reviewed: Did you receive and understand the discharge instructions provided?: Yes Medications obtained,verified, and reconciled?: Yes (Medications Reviewed) Any new allergies since your discharge?: No Dietary orders reviewed?: NA Do you have support at home?: Yes People in Home: spouse  Medications Reviewed Today: Medications Reviewed Today   Medications were not reviewed in this encounter     Home Care and Equipment/Supplies: Were Home Health Services Ordered?: NA Any new equipment or medical supplies ordered?: NA  Functional Questionnaire: Do you need assistance with bathing/showering or dressing?: No Do you need assistance with meal preparation?: No Do you need assistance with eating?: No Do you have difficulty maintaining continence: No Do you need assistance with getting out of bed/getting out of a chair/moving?: No Do you have difficulty managing or taking your medications?: No  Follow up appointments reviewed: PCP Follow-up appointment confirmed?: Yes Date of PCP follow-up appointment?: 04/17/23 Specialist Hospital Follow-up appointment confirmed?: NA Do you need transportation to your follow-up appointment?: No Do you understand care options if your condition(s) worsen?: Yes-patient verbalized understanding    SIGNATURE Shanda Sharps

## 2023-04-16 ENCOUNTER — Telehealth (HOSPITAL_BASED_OUTPATIENT_CLINIC_OR_DEPARTMENT_OTHER): Payer: Self-pay

## 2023-04-16 NOTE — Telephone Encounter (Signed)
 Post ED Visit - Positive Culture Follow-up  Culture report reviewed by antimicrobial stewardship pharmacist: [facility] []  Rankin Dee, Pharm.D. []  Venetia Gully, Pharm.D., BCPS AQ-ID []  Garrel Crews, Pharm.D., BCPS []  Almarie Lunger, [address].D., BCPS []  [place], [place].D., BCPS, AAHIVP []  Rosaline Bihari, Pharm.D., BCPS, AAHIVP []  Vernell Meier, PharmD, BCPS []  Latanya Hint, PharmD, BCPS []  Donald Medley, PharmD, BCPS []  Rocky Bold, PharmD []  Dorothyann Alert, PharmD, BCPS []  Morene Babe, PharmD OLEGARIO Gaines Carrier, PharmD  [facility] []  Rosaline Edison, PharmD []  Romona Bliss, PharmD []  Dolphus Roller, PharmD []  Veva Seip, Rph []  Vernell Daunt) Leonce, PharmD []  Eva Allis, PharmD []  Rosaline Millet, PharmD []  Iantha Batch, PharmD []  Arvin Gauss, PharmD []  Wanda Hasting, PharmD []  Ronal Rav, PharmD []  Rocky Slade, PharmD []  Bard Jeans, PharmD   Positive urine culture Treated with Cefdinir , organism sensitive to the same and no further patient follow-up is required at this time.  Julie Osborne, Julie Osborne 04/16/2023, 2:19 PM

## 2023-04-17 ENCOUNTER — Ambulatory Visit: Payer: Medicare Other | Admitting: Family Medicine

## 2023-04-17 ENCOUNTER — Encounter: Payer: Self-pay | Admitting: Family Medicine

## 2023-04-17 VITALS — BP 138/82 | HR 66 | Temp 97.5°F | Wt 226.2 lb

## 2023-04-17 DIAGNOSIS — N12 Tubulo-interstitial nephritis, not specified as acute or chronic: Secondary | ICD-10-CM

## 2023-04-17 DIAGNOSIS — N390 Urinary tract infection, site not specified: Secondary | ICD-10-CM

## 2023-04-17 DIAGNOSIS — B9620 Unspecified Escherichia coli [E. coli] as the cause of diseases classified elsewhere: Secondary | ICD-10-CM | POA: Diagnosis not present

## 2023-04-17 HISTORY — DX: Urinary tract infection, site not specified: N39.0

## 2023-04-17 HISTORY — DX: Tubulo-interstitial nephritis, not specified as acute or chronic: N12

## 2023-04-17 MED ORDER — NITROFURANTOIN MONOHYD MACRO 100 MG PO CAPS: 100.0000 mg | ORAL_CAPSULE | Freq: Every day | ORAL | 0 refills | Status: AC

## 2023-04-17 NOTE — Progress Notes (Signed)
 Julie Osborne , Jan 07, 1951, 73 y.o., female MRN: [id number] Patient Care Team    Relationship Specialty Notifications Start End  Catherine Charlies LABOR, DO PCP - General Family Medicine  01/13/16   Gideon Velma Berg, [place] Referring Physician Podiatry  12/24/19   Harden Jerona GAILS, MD Consulting Physician Orthopedic Surgery  08/16/20   Dietels, Mal LABOR, OD  Optometry  07/08/21     Chief Complaint  Patient presents with   Pyelonephritis     Subjective: Julie Osborne is a 73 y.o. Pt presents for an OV with complaints of ED follow up for abd pain.  Patient had been seen in the emergency room 03/27/2023 initially after starting Macrobid  from an ED visit the day prior for presumed UTI.  She started to notice increased urinary frequency, burning with urination, and after starting Macrobid  she became mildly confused and had generalized weakness.  Her family took her to the ED at that time and patient was found to have acute pyelonephritis with E. coli bacteremia and started on ceftriaxone .  She was discharged on cephalexin  for 2 weeks.  She was admitted for 3 days, discharge 03/30/2023. She then returned to the ED 04/13/2023 with abdominal pain.  Urine culture collected that day resulted with pansensitive E. coli.  She was given another round of Rocephin  and treated with Omnicef  twice daily 10 days. Today she states she is feeling improved functionally.  She reports even after completing the antibiotics from her hospital admission she never truly felt well.  She is still having some mild discomfort in the right lower quadrant of her abdomen, but has greatly improved.  She denies any further fever, chills, nausea or confusion.  She is tolerating p.o. and having regular bowel movements.  Abdominal/pelvis CT 03/28/2023: 1. Mild left and moderate right perinephric stranding as well as right periureteral stranding without obstructing calcifications. Decreased attenuation of the right lower pole renal cortex.  Bladder is unremarkable. Findings suggest nephritis and urinary  tract infection. No perinephric fluid collections are identified.  2. Trace pericardial effusion.  3. Small hiatal hernia. Status post gastric bypass. No evidence of bowel obstruction, free fluid or free air.  4. Distention of the gallbladder without biliary ductal dilatation or calcifications.   CT HEAD WO CONTRAST 03/27/2023 TECHNIQUE:  5 mm. helical images obtained from the skull base through the vertex without contrast.  2.5 mm. reconstructed bone algorithm, and 3 mm sagittal and coronal reformatted images provided.  CT images were acquired using Automated Exposure Control for dose reduction.  COMPARISON:  None available  FINDINGS:  CT examination of the brain demonstrates a normal ventricular system. No hemorrhage or cerebral edema is seen. No skull fracture is identified. The examination does suggest scattered chronic small vessel ischemic change involving the periventricular  white matter of both cerebral hemispheres. The cerebellar tonsils lie above the level of foramen magnum. The examination of the orbits demonstrates bilateral proptosis of both globes. The extraocular muscles are normal. The paranasal sinuses, mastoid air   cells, and middle ears are clear.      10/24/2022   10:37 AM 08/01/2022    8:41 AM 06/25/2022   11:33 AM 07/08/2021    9:41 AM 03/24/2021    8:27 AM  Depression screen PHQ 2/9  Decreased Interest 0 0 0 0 0  Down, Depressed, Hopeless 0 0 0 0 0  PHQ - 2 Score 0 0 0 0 0  Altered sleeping 0      Tired,  decreased energy 0      Change in appetite 0      Feeling bad or failure about yourself  0      Trouble concentrating 0      Moving slowly or fidgety/restless 0      Suicidal thoughts 0      PHQ-9 Score 0      Difficult doing work/chores Not difficult at all        Allergies  Allergen Reactions   Statins     Lipitor,simvatatin, pravastatin.welchol cause myalgia   Social History   Social  History Narrative   Married to [employer], 2 children.    BS, RN retired now agricultural consultant.    Drinks caffeine, uses herbal remedies. Daily vitmain use.   Wears her seatbelt, smoke detector in the home.    exercises routinely.    She feels safe in her relationships.    Past Medical History:  Diagnosis Date   Apnea    Closed fracture of neck of left radius 10/14/2017   Concussion    Degenerative arthritis    generalized/back/LEFT foot   Diabetic neuropathy (HCC) 2017   Diverticulosis 06/19/2013   sigmoid colon on colonoscopy   Hyperlipidemia 2000   on meds   Hypertension, benign 1996   on meds   Lumbar radiculopathy    Severe L4/L5 radiculopathy by old records.    Pes planus 12/17/2011   Posterior tibial tendon dysfunction (PTTD) of left lower extremity 04/10/2015   post tibial tendon dysfunction stage 5 (PTTD)   Situational anxiety 03/30/2020   Tenosynovitis of ankle 2017   post tibial tendon dysfunction stage 5 (PTTD)   Uncontrolled diabetes mellitus 2005   on meds   Past Surgical History:  Procedure Laterality Date   CORONARY ANGIOPLASTY WITH STENT PLACEMENT  10/17/2018   DES x2/LAD   EXPLORATORY LAPAROTOMY  1976   endometriosis   MINOR HEMORRHOIDECTOMY  2006   OTHER SURGICAL HISTORY Right 1975   venous ligation   Family History  Problem Relation Age of Onset   COPD Mother    Diabetes Mellitus II Mother    Heart failure Mother    Osteoarthritis Father    Cancer Father        tumor found behind xyphoid process   Diabetes Sister    Diabetes Brother    Dementia Maternal Grandmother    Heart disease Maternal Grandmother    Heart disease Maternal Grandfather    Stroke Maternal Grandfather    Heart failure Maternal Aunt    Lung cancer Paternal Grandfather    Lung cancer Paternal Aunt    Colon polyps Neg Hx    Colon cancer Neg Hx    Esophageal cancer Neg Hx    Stomach cancer Neg Hx    Rectal cancer Neg Hx    Allergies as of 04/17/2023       Reactions   Statins     Lipitor,simvatatin, pravastatin.welchol cause myalgia        Medication List        Accurate as of April 17, 2023 12:00 PM. If you have any questions, ask your nurse or doctor.          STOP taking these medications    cephALEXin  500 MG capsule Commonly known as: KEFLEX        TAKE these medications    albuterol  108 (90 Base) MCG/ACT inhaler Commonly known as: VENTOLIN  HFA Inhale 2 puffs into the lungs every 4 (four) hours as needed.  albuterol  108 (90 Base) MCG/ACT inhaler Commonly known as: VENTOLIN  HFA Inhale 1-2 puffs into the lungs every 6 (six) hours as needed for wheezing or shortness of breath.   alendronate  35 MG tablet Commonly known as: FOSAMAX  Take 35 mg by mouth every 7 (seven) days. What changed: Another medication with the same name was removed. Continue taking this medication, and follow the directions you see here.   amLODipine  2.5 MG tablet Commonly known as: NORVASC  Take 1 tablet (2.5 mg total) by mouth daily.   aspirin EC 81 MG tablet Take 81 mg by mouth daily.   cefdinir  300 MG capsule Commonly known as: OMNICEF  Take 1 capsule (300 mg total) by mouth 2 (two) times daily for 10 days.   celecoxib  200 MG capsule Commonly known as: CeleBREX  Take 1 capsule (200 mg total) by mouth 2 (two) times daily.   fluticasone  50 MCG/ACT nasal spray Commonly known as: FLONASE  Place 1 spray into both nostrils daily.   fluticasone  50 MCG/ACT nasal spray Commonly known as: FLONASE  Place 2 sprays into both nostrils daily.   insulin  lispro 100 UNIT/ML injection Commonly known as: HUMALOG Inject into the skin 3 (three) times daily with meals.   HumaLOG KwikPen 100 UNIT/ML KwikPen Generic drug: insulin  lispro Inject into the skin 3 (three) times daily.   metoprolol  succinate 50 MG 24 hr tablet Commonly known as: TOPROL -XL Take 1 tablet (50 mg total) by mouth daily.   multivitamin capsule Take 1 capsule by mouth daily.   nitrofurantoin   (macrocrystal-monohydrate) 100 MG capsule Commonly known as: Macrobid  Take 1 capsule (100 mg total) by mouth at bedtime for 14 days. What changed: when to take this   nitroGLYCERIN 0.4 MG SL tablet Commonly known as: NITROSTAT Place under the tongue.   omeprazole 20 MG capsule Commonly known as: PRILOSEC Take 20 mg by mouth daily.   ondansetron  8 MG tablet Commonly known as: ZOFRAN  Take 8 mg by mouth every 8 (eight) hours as needed for nausea or vomiting.   rosuvastatin  20 MG tablet Commonly known as: CRESTOR  Take 1 tablet (20 mg total) by mouth daily.   senna-docusate 8.6-50 MG tablet Commonly known as: Senokot-S Take 1 tablet by mouth 2 (two) times daily.   tiZANidine  4 MG tablet Commonly known as: ZANAFLEX  Take 1 tablet (4 mg total) by mouth every 6 (six) hours as needed for muscle spasms.   insulin  glargine (1 Unit Dial) 300 UNIT/ML Solostar Pen Commonly known as: TOUJEO  Inject 35 Units into the skin at bedtime.   Toujeo  Max SoloStar 300 UNIT/ML Solostar Pen Generic drug: insulin  glargine (2 Unit Dial) Inject into the skin. 45 unit sub q PM   ursodiol 250 MG tablet Commonly known as: ACTIGALL PLEASE SEE ATTACHED FOR DETAILED DIRECTIONS   valsartan  320 MG tablet Commonly known as: DIOVAN  Take 1 tablet (320 mg total) by mouth daily.        All past medical history, surgical history, allergies, family history, immunizations andmedications were updated in the EMR today and reviewed under the history and medication portions of their EMR.     ROS Negative, with the exception of above mentioned in HPI   Objective:  BP 138/82   Pulse 66   Temp (!) 97.5 F (36.4 C)   Wt 226 lb 3.2 oz (102.6 kg)   SpO2 99%   BMI 32.46 kg/m  Body mass index is 32.46 kg/m. Physical Exam Vitals and nursing note reviewed.  Constitutional:      General: She is  not in acute distress.    Appearance: Normal appearance. She is not ill-appearing, toxic-appearing or diaphoretic.   HENT:     Head: Normocephalic and atraumatic.  Eyes:     General: No scleral icterus.       Right eye: No discharge.        Left eye: No discharge.     Extraocular Movements: Extraocular movements intact.     Conjunctiva/sclera: Conjunctivae normal.     Pupils: Pupils are equal, round, and reactive to light.  Cardiovascular:     Rate and Rhythm: Normal rate and regular rhythm.  Pulmonary:     Effort: Pulmonary effort is normal. No respiratory distress.     Breath sounds: Normal breath sounds. No wheezing, rhonchi or rales.  Abdominal:     General: Bowel sounds are normal. There is no distension.     Palpations: Abdomen is soft.     Tenderness: There is no right CVA tenderness, left CVA tenderness, guarding or rebound.  Musculoskeletal:     Right lower leg: No edema.     Left lower leg: No edema.  Skin:    General: Skin is warm.     Findings: No rash.  Neurological:     Mental Status: She is alert and oriented to person, place, and time. Mental status is at baseline.     Motor: No weakness.     Gait: Gait normal.  Psychiatric:        Mood and Affect: Mood normal.        Behavior: Behavior normal.        Thought Content: Thought content normal.        Judgment: Judgment normal.   Resume your Christmas stomach crystals because unable to feel No results found. No results found. No results found for this or any previous visit (from the past 24 hours).  Assessment/Plan: Julie Osborne is a 73 y.o. female present for OV for  E. coli UTI/pyelonephritis -Reviewed hospitalization discharge summary from 03/30/2023, and recent emergency room visit notes from 04/13/2023. -Patient is tolerating p.o.  Pain has improved.  Urinary symptoms are improving.  She states she still has very mild discomfort in the right lower quadrant/suprapubic area, but again is improving.  Complete full course of omnicef  BID prescribed by ED physician, then start macrobid  at bedtime for 2 weeks for extended  coverage. Urcx rpt after abx completion in 2-3 weeks.  As needed, if symptoms recur  Reviewed expectations re: course of current medical issues. Discussed self-management of symptoms. Outlined signs and symptoms indicating need for more acute intervention. Patient verbalized understanding and all questions were answered. Patient received an After-Visit Summary.  I did advise    Orders Placed This Encounter  Procedures   Urine Culture   Meds ordered this encounter  Medications   nitrofurantoin , macrocrystal-monohydrate, (MACROBID ) 100 MG capsule    Sig: Take 1 capsule (100 mg total) by mouth at bedtime for 14 days.    Dispense:  14 capsule    Refill:  0   Referral Orders  No referral(s) requested today     Note is dictated utilizing voice recognition software. Although note has been proof read prior to signing, occasional typographical errors still can be missed. If any questions arise, please do not hesitate to call for verification.   electronically signed by:  Charlies Bellini, DO  [facility] [place]   This is a new day which did not surprise me

## 2023-04-17 NOTE — Patient Instructions (Addendum)

## 2023-04-19 ENCOUNTER — Ambulatory Visit: Payer: Medicare Other

## 2023-04-20 ENCOUNTER — Other Ambulatory Visit: Payer: Self-pay | Admitting: Family Medicine

## 2023-04-25 ENCOUNTER — Ambulatory Visit: Payer: Medicare Other | Admitting: Family Medicine

## 2023-05-02 ENCOUNTER — Encounter: Payer: Self-pay | Admitting: Family Medicine

## 2023-05-02 NOTE — Telephone Encounter (Signed)
Copied and pasted CRM  Reason for CRM: Patient called stated she left a message for her provider or her nurse and would like a call back from either one of them regarding this matter. I offered to transfer patient to one of our triage nurses she declined and would rather speak with her provider or her nurse regarding the message left on MyChart.

## 2023-05-03 MED ORDER — FLUCONAZOLE 150 MG PO TABS: 150.0000 mg | ORAL_TABLET | Freq: Every day | ORAL | 0 refills | Status: AC

## 2023-05-03 NOTE — Telephone Encounter (Signed)
Please inform patient I have called in Diflucan for her yeast symptoms.  This is a one-time tablet. If symptoms worsen over the weekend she should be seen in urgent care so they can test her urine.

## 2023-05-03 NOTE — Telephone Encounter (Signed)
Pt advised of new meds sent and wants to know if she can continue the Azo because it has been helping with the pain

## 2023-05-03 NOTE — Telephone Encounter (Signed)
She can continue the Azo, but would not recommend taking greater than a week

## 2023-05-03 NOTE — Telephone Encounter (Signed)
Pt advised of results.

## 2023-05-08 ENCOUNTER — Encounter: Payer: Self-pay | Admitting: Family Medicine

## 2023-05-08 ENCOUNTER — Ambulatory Visit: Payer: Medicare Other | Admitting: Family Medicine

## 2023-05-08 VITALS — BP 152/72 | HR 80 | Temp 97.4°F | Wt 223.8 lb

## 2023-05-08 DIAGNOSIS — N12 Tubulo-interstitial nephritis, not specified as acute or chronic: Secondary | ICD-10-CM | POA: Diagnosis not present

## 2023-05-08 DIAGNOSIS — N10 Acute tubulo-interstitial nephritis: Secondary | ICD-10-CM | POA: Diagnosis not present

## 2023-05-08 DIAGNOSIS — B9620 Unspecified Escherichia coli [E. coli] as the cause of diseases classified elsewhere: Secondary | ICD-10-CM

## 2023-05-08 DIAGNOSIS — R509 Fever, unspecified: Secondary | ICD-10-CM

## 2023-05-08 DIAGNOSIS — N390 Urinary tract infection, site not specified: Secondary | ICD-10-CM

## 2023-05-08 LAB — CBC WITH DIFFERENTIAL/PLATELET
Basophils Absolute: 0 10*3/uL (ref 0.0–0.1)
Basophils Relative: 0.3 % (ref 0.0–3.0)
Eosinophils Absolute: 0.1 10*3/uL (ref 0.0–0.7)
Eosinophils Relative: 1.5 % (ref 0.0–5.0)
HCT: 42.3 % (ref 36.0–46.0)
Hemoglobin: 13.9 g/dL (ref 12.0–15.0)
Lymphocytes Relative: 36.3 % (ref 12.0–46.0)
Lymphs Abs: 3.5 10*3/uL (ref 0.7–4.0)
MCHC: 32.9 g/dL (ref 30.0–36.0)
MCV: 91.1 fL (ref 78.0–100.0)
Monocytes Absolute: 0.6 10*3/uL (ref 0.1–1.0)
Monocytes Relative: 6.6 % (ref 3.0–12.0)
Neutro Abs: 5.4 10*3/uL (ref 1.4–7.7)
Neutrophils Relative %: 55.3 % (ref 43.0–77.0)
Platelets: 270 10*3/uL (ref 150.0–400.0)
RBC: 4.64 Mil/uL (ref 3.87–5.11)
RDW: 14.4 % (ref 11.5–15.5)
WBC: 9.7 10*3/uL (ref 4.0–10.5)

## 2023-05-08 MED ORDER — URIBEL 81.6 MG PO TABS: 1.0000 | ORAL_TABLET | Freq: Every day | ORAL | 0 refills | Status: DC

## 2023-05-08 MED ORDER — FLUCONAZOLE 150 MG PO TABS: 150.0000 mg | ORAL_TABLET | ORAL | 0 refills | Status: AC

## 2023-05-08 MED ORDER — CEFTRIAXONE SODIUM 1 G IJ SOLR
1.0000 g | Freq: Once | INTRAMUSCULAR | Status: AC
  Administered 2023-05-08: 1 g via INTRAMUSCULAR

## 2023-05-08 MED ORDER — CIPROFLOXACIN HCL 500 MG PO TABS: 500.0000 mg | ORAL_TABLET | Freq: Two times a day (BID) | ORAL | 0 refills | Status: DC

## 2023-05-08 NOTE — Progress Notes (Unsigned)
Julie Osborne , 1951-02-10, 73 y.o., female MRN: [id number] Patient Care Team    Relationship Specialty Notifications Start End  Natalia Leatherwood, DO PCP - General Family Medicine  01/13/16   Resa Miner, [place] Referring Physician Podiatry  12/24/19   Nadara Mustard, MD Consulting Physician Orthopedic Surgery  08/16/20   Dietels, Delanna Notice, OD  Optometry  07/08/21     Chief Complaint  Patient presents with   lower abdominal discomfort    Feels like period cramps, Last abx taken yesterday; had since dec     Subjective: Julie Osborne is a 73 y.o. Pt presents for an OV with complaints of continued lower abdominal discomfort.  Please see prior note below for, she was seen in the emergency room for this condition admitted for pyelonephritis.  She was doing okay after hospital follow-up but felt continued symptoms.  Urine culture was collected positive for E. coli pansensitive at 30K colony.  Patient was given extended course of antibiotic Omnicef twice daily, then encouraged to start Macrobid 100 mg daily thereafter for 2 additional weeks.  Patient returns today stating that she took her last antibiotic yesterday and she is having lower abdominal cramping, which she is describes similar to menstrual cramps. Prior note: ED follow up for abd pain.  Patient had been seen in the emergency room 03/27/2023 initially after starting Macrobid from an ED visit the day prior for presumed UTI.  She started to notice increased urinary frequency, burning with urination, and after starting Macrobid she became mildly confused and had generalized weakness.  Her family took her to the ED at that time and patient was found to have acute pyelonephritis with E. coli bacteremia and started on ceftriaxone.  She was discharged on cephalexin for 2 weeks.  She was admitted for 3 days, discharge 03/30/2023. She then returned to the ED 04/13/2023 with abdominal pain.  Urine culture collected that day resulted with  pansensitive E. coli.  She was given another round of Rocephin and treated with Omnicef twice daily 10 days. Today she states she is feeling improved functionally.  She reports even after completing the antibiotics from her hospital admission she never truly felt well.  She is still having some mild discomfort in the right lower quadrant of her abdomen, but has greatly improved.  She denies any further fever, chills, nausea or confusion.  She is tolerating p.o. and having regular bowel movements.  04/15/2023 FINAL   Organism ID, Bacteria ESCHERICHIA COLI Abnormal   Resulting Agency [facility]     Susceptibility   Escherichia coli    MIC    AMPICILLIN <=2 SENSITIVE Sensitive    AMPICILLIN/SULBACTAM <=2 SENSITIVE Sensitive    CEFAZOLIN <=4 SENSITIVE Sensitive    CEFEPIME <=0.12 SENS... Sensitive    CEFTRIAXONE <=0.25 SENS... Sensitive    CIPROFLOXACIN <=0.25 SENS... Sensitive    GENTAMICIN <=1 SENSITIVE Sensitive    IMIPENEM <=0.25 SENS... Sensitive    NITROFURANTOIN <=16 SENSIT... Sensitive    PIP/TAZO <=4 SENSITI... Sensitive    TRIMETH/SULFA <=20 SENSIT... Sensitive           Susceptibility Comments    Abdominal/pelvis CT 03/28/2023: 1. Mild left and moderate right perinephric stranding as well as right periureteral stranding without obstructing calcifications. Decreased attenuation of t he right lower pole renal cortex. Bladder is unremarkable. Findings suggest nephritis and urinary  tract infection. No perinephric fluid collections are identified.  2. Trace pericardial effusion.  3. Small hiatal  hernia. Status post gastric bypass. No evidence of bowel obstruction, free fluid or free air.  4. Distention of the gallbladder without biliary ductal dilatation or calcifications.   CT HEAD WO CONTRAST 03/27/2023 TECHNIQUE:  5 mm. helical images obtained from the skull base through the vertex without contrast.  2.5 mm. reconstructed bone algorithm, and 3 mm sagittal and coronal  reformatted images provided.  CT images were acquired using Automated Exposure Control for dose reduction.  COMPARISON:  None available  FINDINGS:  CT examination of the brain demonstrates a normal ventricular system. No hemorrhage or cerebral edema is seen. No skull fracture is identified. The examination does suggest scattered chronic small vessel ischemic change involving the periventricular  white matter of both cerebral hemispheres. The cerebellar tonsils lie above the level of foramen magnum. The examination of the orbits demonstrates bilateral proptosis of both globes. The extraocular muscles are normal. The paranasal sinuses, mastoid air   cells, and middle ears are clear.      05/08/2023   11:10 AM 10/24/2022   10:37 AM 08/01/2022    8:41 AM 06/25/2022   11:33 AM 07/08/2021    9:41 AM  Depression screen PHQ 2/9  Decreased Interest 0 0 0 0 0  Down, Depressed, Hopeless 0 0 0 0 0  PHQ - 2 Score 0 0 0 0 0  Altered sleeping 0 0     Tired, decreased energy 0 0     Change in appetite 0 0     Feeling bad or failure about yourself  0 0     Trouble concentrating 0 0     Moving slowly or fidgety/restless 0 0     Suicidal thoughts 0 0     PHQ-9 Score 0 0     Difficult doing work/chores Not difficult at all Not difficult at all       Allergies  Allergen Reactions   Statins     Lipitor,simvatatin, pravastatin.welchol cause myalgia   Social History   Social History Narrative   Married to [employer], 2 children.    BS, RN retired now Agricultural consultant.    Drinks caffeine, uses herbal remedies. Daily vitmain use.   Wears her seatbelt, smoke detector in the home.    exercises routinely.    She feels safe in her relationships.    Past Medical History:  Diagnosis Date   Apnea    Closed fracture of neck of left radius 10/14/2017   Concussion    Degenerative arthritis    generalized/back/LEFT foot   Diabetic neuropathy (HCC) 2017   Diverticulosis 06/19/2013   sigmoid colon on colonoscopy    Hyperlipidemia 2000   on meds   Hypertension, benign 1996   on meds   Lumbar radiculopathy    Severe L4/L5 radiculopathy by old records.    Pes planus 12/17/2011   Posterior tibial tendon dysfunction (PTTD) of left lower extremity 04/10/2015   post tibial tendon dysfunction stage 5 (PTTD)   Situational anxiety 03/30/2020   Tenosynovitis of ankle 2017   post tibial tendon dysfunction stage 5 (PTTD)   Uncontrolled diabetes mellitus 2005   on meds   Past Surgical History:  Procedure Laterality Date   CORONARY ANGIOPLASTY WITH STENT PLACEMENT  10/17/2018   DES x2/LAD   EXPLORATORY LAPAROTOMY  1976   endometriosis   MINOR HEMORRHOIDECTOMY  2006   OTHER SURGICAL HISTORY Right 1975   venous ligation   Family History  Problem Relation Age of Onset   COPD Mother  Diabetes Mellitus II Mother    Heart failure Mother    Osteoarthritis Father    Cancer Father        tumor found behind xyphoid process   Diabetes Sister    Diabetes Brother    Dementia Maternal Grandmother    Heart disease Maternal Grandmother    Heart disease Maternal Grandfather    Stroke Maternal Grandfather    Heart failure Maternal Aunt    Lung cancer Paternal Grandfather    Lung cancer Paternal Aunt    Colon polyps Neg Hx    Colon cancer Neg Hx    Esophageal cancer Neg Hx    Stomach cancer Neg Hx    Rectal cancer Neg Hx    Allergies as of 05/08/2023       Reactions   Statins    Lipitor,simvatatin, pravastatin.welchol cause myalgia        Medication List        Accurate as of May 08, 2023 11:59 PM. If you have any questions, ask your nurse or doctor.          albuterol 108 (90 Base) MCG/ACT inhaler Commonly known as: VENTOLIN HFA Inhale 2 puffs into the lungs every 4 (four) hours as needed.   albuterol 108 (90 Base) MCG/ACT inhaler Commonly known as: VENTOLIN HFA Inhale 1-2 puffs into the lungs every 6 (six) hours as needed for wheezing or shortness of breath.   alendronate 35 MG  tablet Commonly known as: FOSAMAX Take 35 mg by mouth every 7 (seven) days.   amLODipine 2.5 MG tablet Commonly known as: NORVASC TAKE 1 TABLET BY MOUTH EVERY DAY   aspirin EC 81 MG tablet Take 81 mg by mouth daily.   celecoxib 200 MG capsule Commonly known as: CeleBREX Take 1 capsule (200 mg total) by mouth 2 (two) times daily.   ciprofloxacin 500 MG tablet Commonly known as: Cipro Take 1 tablet (500 mg total) by mouth 2 (two) times daily for 3 days. Started by: Felix Pacini   fluconazole 150 MG tablet Commonly known as: DIFLUCAN Take 1 tablet (150 mg total) by mouth once a week for 4 doses. Started by: Felix Pacini   fluticasone 50 MCG/ACT nasal spray Commonly known as: FLONASE Place 1 spray into both nostrils daily.   fluticasone 50 MCG/ACT nasal spray Commonly known as: FLONASE Place 2 sprays into both nostrils daily.   insulin lispro 100 UNIT/ML injection Commonly known as: HUMALOG Inject into the skin 3 (three) times daily with meals.   HumaLOG KwikPen 100 UNIT/ML KwikPen Generic drug: insulin lispro Inject into the skin 3 (three) times daily.   metoprolol succinate 50 MG 24 hr tablet Commonly known as: TOPROL-XL Take 1 tablet (50 mg total) by mouth daily.   multivitamin capsule Take 1 capsule by mouth daily.   nitroGLYCERIN 0.4 MG SL tablet Commonly known as: NITROSTAT Place under the tongue.   omeprazole 20 MG capsule Commonly known as: PRILOSEC Take 20 mg by mouth daily.   ondansetron 8 MG tablet Commonly known as: ZOFRAN Take 8 mg by mouth every 8 (eight) hours as needed for nausea or vomiting.   rosuvastatin 20 MG tablet Commonly known as: CRESTOR Take 1 tablet (20 mg total) by mouth daily.   senna-docusate 8.6-50 MG tablet Commonly known as: Senokot-S Take 1 tablet by mouth 2 (two) times daily.   tiZANidine 4 MG tablet Commonly known as: ZANAFLEX Take 1 tablet (4 mg total) by mouth every 6 (six) hours as needed for  muscle spasms.    insulin glargine (1 Unit Dial) 300 UNIT/ML Solostar Pen Commonly known as: TOUJEO Inject 35 Units into the skin at bedtime.   Toujeo Max SoloStar 300 UNIT/ML Solostar Pen Generic drug: insulin glargine (2 Unit Dial) Inject into the skin. 45 unit sub q PM   Uribel 81.6 MG Tabs Generic drug: Meth-Hyo-M Bl-Benz Acd-Ph Sal Take 1 tablet (81.6 mg total) by mouth daily. Started by: Felix Pacini   ursodiol 250 MG tablet Commonly known as: ACTIGALL PLEASE SEE ATTACHED FOR DETAILED DIRECTIONS   valsartan 320 MG tablet Commonly known as: DIOVAN Take 1 tablet (320 mg total) by mouth daily.        All past medical history, surgical history, allergies, family history, immunizations andmedications were updated in the EMR today and reviewed under the history and medication portions of their EMR.     ROS Negative, with the exception of above mentioned in HPI   Objective:  BP (!) 152/72   Pulse 80   Temp (!) 97.4 F (36.3 C)   Wt 223 lb 12.8 oz (101.5 kg)   SpO2 97%   BMI 32.11 kg/m  Body mass index is 32.11 kg/m. Physical Exam Vitals and nursing note reviewed.  Constitutional:      General: She is not in acute distress.    Appearance: Normal appearance. She is obese. She is not ill-appearing, toxic-appearing or diaphoretic.  HENT:     Head: Normocephalic and atraumatic.  Eyes:     General: No scleral icterus.       Right eye: No discharge.        Left eye: No discharge.     Extraocular Movements: Extraocular movements intact.     Conjunctiva/sclera: Conjunctivae normal.     Pupils: Pupils are equal, round, and reactive to light.  Cardiovascular:     Rate and Rhythm: Normal rate and regular rhythm.  Pulmonary:     Effort: Pulmonary effort is normal. No respiratory distress.     Breath sounds: Normal breath sounds. No wheezing, rhonchi or rales.  Abdominal:     General: Bowel sounds are normal. There is no distension.     Palpations: Abdomen is soft.     Tenderness:  There is no right CVA tenderness, left CVA tenderness, guarding or rebound.  Musculoskeletal:     Right lower leg: No edema.     Left lower leg: No edema.  Skin:    General: Skin is warm.     Findings: No rash.  Neurological:     Mental Status: She is alert and oriented to person, place, and time. Mental status is at baseline.     Motor: No weakness.     Gait: Gait normal.  Psychiatric:        Mood and Affect: Mood normal.        Behavior: Behavior normal.        Thought Content: Thought content normal.        Judgment: Judgment normal.   Resume your Christmas stomach crystals because unable to feel No results found. No results found. No results found for this or any previous visit (from the past 24 hours).  Assessment/Plan: Julie Osborne is a 73 y.o. female present for OV for  E. coli UTI/pyelonephritis/recurrent fever -Vital signs are stable today, patient does appear fatigued.  She is having recurrent low-grade fevers up to 100.9 F despite treatment adequate treatment with Omnicef twice daily by hospital system,, plus extended treatment with Macrobid 100 mg  daily thereafter for 2 weeks. She has completely finished the course of treatment, and symptoms are still present with mild cramping in her lower abdomen. Repeat urinalysis with reflex culture today. Referral to urology is placed since resolved.  Infectious and she is having recurrent fevers. CT abdomen pelvis repeat order placed.  She did not have any CVA tenderness today, but with recurrent fevers and on the suprapubic discomfort, concern for recurrence of pyelonephritis. Rocephin 1 g IM completed today. Start Cipro 500 mg twice daily while we wait on urine culture. Uribel prescription printed for with directions that she can take every 6-8 hours at onset of symptoms in the future.  She can start today to see if that helps with her symptoms, she understands not to use this long-term but for symptoms only. CBC with  differential collected    Reviewed expectations re: course of current medical issues. Discussed self-management of symptoms. Outlined signs and symptoms indicating need for more acute intervention. Patient verbalized understanding and all questions were answered. Patient received an After-Visit Summary.  I did advise    Orders Placed This Encounter  Procedures   Urine Culture   CT ABDOMEN PELVIS W CONTRAST   Urinalysis w microscopic + reflex cultur   CBC w/Diff   REFLEXIVE URINE CULTURE   Ambulatory referral to Urology   Meds ordered this encounter  Medications   Meth-Hyo-M Bl-Benz Acd-Ph Sal (URIBEL) 81.6 MG TABS    Sig: Take 1 tablet (81.6 mg total) by mouth daily.    Dispense:  120 tablet    Refill:  0   ciprofloxacin (CIPRO) 500 MG tablet    Sig: Take 1 tablet (500 mg total) by mouth 2 (two) times daily for 3 days.    Dispense:  6 tablet    Refill:  0   fluconazole (DIFLUCAN) 150 MG tablet    Sig: Take 1 tablet (150 mg total) by mouth once a week for 4 doses.    Dispense:  4 tablet    Refill:  0   cefTRIAXone (ROCEPHIN) injection 1 g   Referral Orders         Ambulatory referral to Urology     44 minutes spent during patient encounter covering urgent illness/symptoms for possible recurrence of pyelonephritis.  Referrals and imaging as well as labs collected.  Note is dictated utilizing voice recognition software. Although note has been proof read prior to signing, occasional typographical errors still can be missed. If any questions arise, please do not hesitate to call for verification.   electronically signed by:  Felix Pacini, DO  [facility] [place]   This is a new day which did not surprise me

## 2023-05-08 NOTE — Progress Notes (Deleted)
Julie Osborne , March 15, 1951, 73 y.o., female MRN: [id number] Patient Care Team    Relationship Specialty Notifications Start End  Natalia Leatherwood, DO PCP - General Family Medicine  01/13/16   Resa Miner, [place] Referring Physician Podiatry  12/24/19   Nadara Mustard, MD Consulting Physician Orthopedic Surgery  08/16/20   Dietels, Delanna Notice, OD  Optometry  07/08/21     No chief complaint on file.    Subjective: Julie Osborne is a 73 y.o. Pt presents for an OV with complaints of *** of *** duration.  Associated symptoms include ***.  Pt has tried *** to ease their symptoms.      10/24/2022   10:37 AM 08/01/2022    8:41 AM 06/25/2022   11:33 AM 07/08/2021    9:41 AM 03/24/2021    8:27 AM  Depression screen PHQ 2/9  Decreased Interest 0 0 0 0 0  Down, Depressed, Hopeless 0 0 0 0 0  PHQ - 2 Score 0 0 0 0 0  Altered sleeping 0      Tired, decreased energy 0      Change in appetite 0      Feeling bad or failure about yourself  0      Trouble concentrating 0      Moving slowly or fidgety/restless 0      Suicidal thoughts 0      PHQ-9 Score 0      Difficult doing work/chores Not difficult at all        Allergies  Allergen Reactions   Statins     Lipitor,simvatatin, pravastatin.welchol cause myalgia   Social History   Social History Narrative   Married to [employer], 2 children.    BS, RN retired now Agricultural consultant.    Drinks caffeine, uses herbal remedies. Daily vitmain use.   Wears her seatbelt, smoke detector in the home.    exercises routinely.    She feels safe in her relationships.    Past Medical History:  Diagnosis Date   Apnea    Closed fracture of neck of left radius 10/14/2017   Concussion    Degenerative arthritis    generalized/back/LEFT foot   Diabetic neuropathy (HCC) 2017   Diverticulosis 06/19/2013   sigmoid colon on colonoscopy   Hyperlipidemia 2000   on meds   Hypertension, benign 1996   on meds   Lumbar radiculopathy    Severe L4/L5 radiculopathy  by old records.    Pes planus 12/17/2011   Posterior tibial tendon dysfunction (PTTD) of left lower extremity 04/10/2015   post tibial tendon dysfunction stage 5 (PTTD)   Situational anxiety 03/30/2020   Tenosynovitis of ankle 2017   post tibial tendon dysfunction stage 5 (PTTD)   Uncontrolled diabetes mellitus 2005   on meds   Past Surgical History:  Procedure Laterality Date   CORONARY ANGIOPLASTY WITH STENT PLACEMENT  10/17/2018   DES x2/LAD   EXPLORATORY LAPAROTOMY  1976   endometriosis   MINOR HEMORRHOIDECTOMY  2006   OTHER SURGICAL HISTORY Right 1975   venous ligation   Family History  Problem Relation Age of Onset   COPD Mother    Diabetes Mellitus II Mother    Heart failure Mother    Osteoarthritis Father    Cancer Father        tumor found behind xyphoid process   Diabetes Sister    Diabetes Brother    Dementia Maternal Grandmother    Heart disease Maternal  Grandmother    Heart disease Maternal Grandfather    Stroke Maternal Grandfather    Heart failure Maternal Aunt    Lung cancer Paternal Grandfather    Lung cancer Paternal Aunt    Colon polyps Neg Hx    Colon cancer Neg Hx    Esophageal cancer Neg Hx    Stomach cancer Neg Hx    Rectal cancer Neg Hx    Allergies as of 05/08/2023       Reactions   Statins    Lipitor,simvatatin, pravastatin.welchol cause myalgia        Medication List        Accurate as of May 08, 2023  7:52 AM. If you have any questions, ask your nurse or doctor.          albuterol 108 (90 Base) MCG/ACT inhaler Commonly known as: VENTOLIN HFA Inhale 2 puffs into the lungs every 4 (four) hours as needed.   albuterol 108 (90 Base) MCG/ACT inhaler Commonly known as: VENTOLIN HFA Inhale 1-2 puffs into the lungs every 6 (six) hours as needed for wheezing or shortness of breath.   alendronate 35 MG tablet Commonly known as: FOSAMAX Take 35 mg by mouth every 7 (seven) days.   amLODipine 2.5 MG tablet Commonly known as:  NORVASC TAKE 1 TABLET BY MOUTH EVERY DAY   aspirin EC 81 MG tablet Take 81 mg by mouth daily.   celecoxib 200 MG capsule Commonly known as: CeleBREX Take 1 capsule (200 mg total) by mouth 2 (two) times daily.   fluticasone 50 MCG/ACT nasal spray Commonly known as: FLONASE Place 1 spray into both nostrils daily.   fluticasone 50 MCG/ACT nasal spray Commonly known as: FLONASE Place 2 sprays into both nostrils daily.   insulin lispro 100 UNIT/ML injection Commonly known as: HUMALOG Inject into the skin 3 (three) times daily with meals.   HumaLOG KwikPen 100 UNIT/ML KwikPen Generic drug: insulin lispro Inject into the skin 3 (three) times daily.   metoprolol succinate 50 MG 24 hr tablet Commonly known as: TOPROL-XL Take 1 tablet (50 mg total) by mouth daily.   multivitamin capsule Take 1 capsule by mouth daily.   nitroGLYCERIN 0.4 MG SL tablet Commonly known as: NITROSTAT Place under the tongue.   omeprazole 20 MG capsule Commonly known as: PRILOSEC Take 20 mg by mouth daily.   ondansetron 8 MG tablet Commonly known as: ZOFRAN Take 8 mg by mouth every 8 (eight) hours as needed for nausea or vomiting.   rosuvastatin 20 MG tablet Commonly known as: CRESTOR Take 1 tablet (20 mg total) by mouth daily.   senna-docusate 8.6-50 MG tablet Commonly known as: Senokot-S Take 1 tablet by mouth 2 (two) times daily.   tiZANidine 4 MG tablet Commonly known as: ZANAFLEX Take 1 tablet (4 mg total) by mouth every 6 (six) hours as needed for muscle spasms.   insulin glargine (1 Unit Dial) 300 UNIT/ML Solostar Pen Commonly known as: TOUJEO Inject 35 Units into the skin at bedtime.   Toujeo Max SoloStar 300 UNIT/ML Solostar Pen Generic drug: insulin glargine (2 Unit Dial) Inject into the skin. 45 unit sub q PM   ursodiol 250 MG tablet Commonly known as: ACTIGALL PLEASE SEE ATTACHED FOR DETAILED DIRECTIONS   valsartan 320 MG tablet Commonly known as: DIOVAN Take 1 tablet  (320 mg total) by mouth daily.        All past medical history, surgical history, allergies, family history, immunizations andmedications were updated in the EMR  today and reviewed under the history and medication portions of their EMR.     ROS Negative, with the exception of above mentioned in HPI   Objective:  There were no vitals taken for this visit. There is no height or weight on file to calculate BMI.  Physical Exam   No results found. No results found. No results found for this or any previous visit (from the past 24 hours).  Assessment/Plan: Julie Osborne is a 73 y.o. female present for OV for  *** Reviewed expectations re: course of current medical issues. Discussed self-management of symptoms. Outlined signs and symptoms indicating need for more acute intervention. Patient verbalized understanding and all questions were answered. Patient received an After-Visit Summary.    No orders of the defined types were placed in this encounter.  No orders of the defined types were placed in this encounter.  Referral Orders  No referral(s) requested today     Note is dictated utilizing voice recognition software. Although note has been proof read prior to signing, occasional typographical errors still can be missed. If any questions arise, please do not hesitate to call for verification.   electronically signed by:  Felix Pacini, DO  [facility] [place]

## 2023-05-10 ENCOUNTER — Telehealth: Payer: Self-pay | Admitting: Family Medicine

## 2023-05-10 LAB — URINE CULTURE
MICRO NUMBER:: 16014254
SPECIMEN QUALITY:: ADEQUATE

## 2023-05-10 MED ORDER — CIPROFLOXACIN HCL 500 MG PO TABS
500.0000 mg | ORAL_TABLET | Freq: Two times a day (BID) | ORAL | 0 refills | Status: AC
Start: 2023-05-10 — End: 2023-05-14

## 2023-05-10 NOTE — Telephone Encounter (Signed)
 Results and recommendations given

## 2023-05-10 NOTE — Telephone Encounter (Signed)
Please inform pt her urine is still infected with E.coli, Continue cipro, I have sent in an additional 4 days (for a total of 7 d tx).  I also placed CT order and urology referral.

## 2023-05-11 LAB — URINE CULTURE
MICRO NUMBER:: 16019216
SPECIMEN QUALITY:: ADEQUATE

## 2023-05-11 LAB — URINALYSIS W MICROSCOPIC + REFLEX CULTURE
Bilirubin Urine: NEGATIVE
Glucose, UA: NEGATIVE
Ketones, ur: NEGATIVE
Nitrites, Initial: POSITIVE — AB
Specific Gravity, Urine: 1.015 (ref 1.001–1.035)
Squamous Epithelial / HPF: NONE SEEN /[HPF] (ref <=5)
WBC, UA: >=60 /[HPF] — AB (ref 0–5)
pH: 7.5 (ref 5.0–8.0)

## 2023-05-11 LAB — CULTURE INDICATED

## 2023-05-15 ENCOUNTER — Encounter: Payer: Self-pay | Admitting: Urology

## 2023-05-15 ENCOUNTER — Ambulatory Visit (INDEPENDENT_AMBULATORY_CARE_PROVIDER_SITE_OTHER): Payer: Medicare Other | Admitting: Urology

## 2023-05-15 VITALS — BP 163/75 | HR 60 | Ht 69.5 in | Wt 220.0 lb

## 2023-05-15 DIAGNOSIS — N390 Urinary tract infection, site not specified: Secondary | ICD-10-CM

## 2023-05-15 DIAGNOSIS — Z87448 Personal history of other diseases of urinary system: Secondary | ICD-10-CM | POA: Diagnosis not present

## 2023-05-15 DIAGNOSIS — N10 Acute tubulo-interstitial nephritis: Secondary | ICD-10-CM

## 2023-05-15 DIAGNOSIS — Z09 Encounter for follow-up examination after completed treatment for conditions other than malignant neoplasm: Secondary | ICD-10-CM | POA: Diagnosis not present

## 2023-05-15 DIAGNOSIS — Z87440 Personal history of urinary (tract) infections: Secondary | ICD-10-CM | POA: Diagnosis not present

## 2023-05-15 LAB — URINALYSIS, ROUTINE W REFLEX MICROSCOPIC
Bilirubin, UA: NEGATIVE
Glucose, UA: NEGATIVE
Ketones, UA: NEGATIVE
Leukocytes,UA: NEGATIVE
Nitrite, UA: NEGATIVE
Protein,UA: NEGATIVE
Specific Gravity, UA: 1.015 (ref 1.005–1.030)
Urobilinogen, Ur: 0.2 mg/dL (ref 0.2–1.0)
pH, UA: 6 (ref 5.0–7.5)

## 2023-05-15 LAB — MICROSCOPIC EXAMINATION
Bacteria, UA: NONE SEEN
Renal Epithel, UA: NONE SEEN /[HPF]
WBC, UA: NONE SEEN /[HPF] (ref 0–5)

## 2023-05-15 LAB — BLADDER SCAN AMB NON-IMAGING

## 2023-05-15 MED ORDER — SULFAMETHOXAZOLE-TRIMETHOPRIM 400-80 MG PO TABS: 1.0000 | ORAL_TABLET | Freq: Every evening | ORAL | 2 refills | Status: DC

## 2023-05-15 MED ORDER — ESTRADIOL 0.1 MG/GM VA CREA: TOPICAL_CREAM | VAGINAL | 12 refills | Status: AC

## 2023-05-15 NOTE — Progress Notes (Addendum)
 Assessment: 1. Acute pyelonephritis   2. Recurrent UTI      Plan: UTI prevention measures were stressed in detail today including encouraging her to stay well-hydrated and not to prolongedly hold her urine.  We also discussed cranberry supplementation, lactobacillus probiotic, vaginal estrogen cream, and also will recommend low-dose antibiotic suppression with Bactrim  RS for 3 months. Patient will return in 4 months for recheck.  Addendum  05/21/2023: Today I reviewed the repeat CT scan that was ordered by her primary care physician.  It continues to show some patchy decreased enhancement areas within the right kidney parenchyma primarily in the anterior polar and lower pole regions there is 1 would see with pyelonephritis.  Likely unresolved inflammatory change.  I called the patient today and left a detailed message on her voicemail.  She had messaged me yesterday regarding possible pyelonephritis and I had not seen her CT scan until today.  I told her that we would contact her and have her come in so we can at least check her urine to make sure she is doing well clinically.  Chief Complaint: uti  History of Present Illness:  Julie Osborne is a 73 y.o. female with multiple medical issues including insulin -dependent diabetes and coronary artery disease who is seen in consultation from [facility], Renee A, DO for evaluation of UTI/pyelonephritis with E. coli bacteremia. Patient was admitted with pyelonephritis in [place]  in mid December for 3 days.  Blood culture at that time was positive for E. coli.  Urine culture was negative.  CT A/P at that time demonstrated bilateral perinephric stranding as well as right periureteral stranding without hydro or obstruction.  Bladder appeared normal.  While in inpatient she was treated with Rocephin  and was discharged on Keflex  for 2 weeks.  Patient again returned to the ED 04/13/2023 complaining of lower abdominal pain.  Urine culture at that time  demonstrated 30K pansensitive E. coli.  She subsequently was treated with Rocephin  and outpatient Omnicef  for 10 days.  Patient was seen in follow-up on 05/07/2022 by her PCP with continued symptoms.  At that time her urine also appeared infected and culture grew greater than 100 K pansensitive E. coli. Today she feels much better and UA is negative. PVR = 0ml  Patient has had prior to recent pyelo also frequent utis generally 3-5 per yr past several yrs. No other febrile uti's         Past Medical History:  Past Medical History:  Diagnosis Date   Apnea    Closed fracture of neck of left radius 10/14/2017   Concussion    Degenerative arthritis    generalized/back/LEFT foot   Diabetic neuropathy (HCC) 2017   Diverticulosis 06/19/2013   sigmoid colon on colonoscopy   Hyperlipidemia 2000   on meds   Hypertension, benign 1996   on meds   Lumbar radiculopathy    Severe L4/L5 radiculopathy by old records.    Pes planus 12/17/2011   Posterior tibial tendon dysfunction (PTTD) of left lower extremity 04/10/2015   post tibial tendon dysfunction stage 5 (PTTD)   Situational anxiety 03/30/2020   Tenosynovitis of ankle 2017   post tibial tendon dysfunction stage 5 (PTTD)   Uncontrolled diabetes mellitus 2005   on meds    Past Surgical History:  Past Surgical History:  Procedure Laterality Date   CORONARY ANGIOPLASTY WITH STENT PLACEMENT  10/17/2018   DES x2/LAD   EXPLORATORY LAPAROTOMY  1976   endometriosis   MINOR HEMORRHOIDECTOMY  2006  OTHER SURGICAL HISTORY Right 1975   venous ligation    Allergies:  Allergies  Allergen Reactions   Statins     Lipitor,simvatatin, pravastatin.welchol cause myalgia    Family History:  Family History  Problem Relation Age of Onset   COPD Mother    Diabetes Mellitus II Mother    Heart failure Mother    Osteoarthritis Father    Cancer Father        tumor found behind xyphoid process   Diabetes Sister    Diabetes Brother     Dementia Maternal Grandmother    Heart disease Maternal Grandmother    Heart disease Maternal Grandfather    Stroke Maternal Grandfather    Heart failure Maternal Aunt    Lung cancer Paternal Grandfather    Lung cancer Paternal Aunt    Colon polyps Neg Hx    Colon cancer Neg Hx    Esophageal cancer Neg Hx    Stomach cancer Neg Hx    Rectal cancer Neg Hx     Social History:  Social History   Tobacco Use   Smoking status: Former    Current packs/day: 0.00    Types: Cigarettes    Quit date: 04/10/2011    Years since quitting: 12.1   Smokeless tobacco: Never  Vaping Use   Vaping status: Never Used  Substance Use Topics   Alcohol use: No   Drug use: No    Review of symptoms:  Constitutional:  Negative for unexplained weight loss, night sweats, fever, chills ENT:  Negative for nose bleeds, sinus pain, painful swallowing CV:  Negative for chest pain, shortness of breath, exercise intolerance, palpitations, loss of consciousness Resp:  Negative for cough, wheezing, shortness of breath GI:  Negative for nausea, vomiting, diarrhea, bloody stools GU:  Positives noted in HPI; otherwise negative for gross hematuria, dysuria, urinary incontinence Neuro:  Negative for seizures, poor balance, limb weakness, slurred speech Psych:  Negative for lack of energy, depression, anxiety Endocrine:  Negative for polydipsia, polyuria, symptoms of hypoglycemia (dizziness, hunger, sweating) Hematologic:  Negative for anemia, purpura, petechia, prolonged or excessive bleeding, use of anticoagulants  Allergic:  Negative for difficulty breathing or choking as a result of exposure to anything; no shellfish allergy; no allergic response (rash/itch) to materials, foods  Physical exam: BP (!) 163/75   Pulse 60   Ht 5' 9.5 (1.765 m)   Wt 220 lb (99.8 kg)   BMI 32.02 kg/m  GENERAL APPEARANCE:  Well appearing, well developed, well nourished, NAD   Results: Results for orders placed or performed in  visit on 05/15/23 (from the past 24 hours)  BLADDER SCAN AMB NON-IMAGING   Collection Time: 05/15/23 12:00 AM  Result Value Ref Range   Scan Result 0ml

## 2023-05-16 ENCOUNTER — Ambulatory Visit
Admission: RE | Admit: 2023-05-16 | Discharge: 2023-05-16 | Disposition: A | Discharge status: Home / Self Care | Payer: Medicare Other | Source: Ambulatory Visit | Attending: Family Medicine | Admitting: Family Medicine

## 2023-05-16 DIAGNOSIS — N10 Acute tubulo-interstitial nephritis: Secondary | ICD-10-CM

## 2023-05-16 MED ORDER — IOPAMIDOL (ISOVUE-370) INJECTION 76%
500.0000 mL | Freq: Once | INTRAVENOUS | Status: AC | PRN
  Administered 2023-05-16: 75 mL via INTRAVENOUS

## 2023-05-19 ENCOUNTER — Other Ambulatory Visit: Payer: Self-pay | Admitting: Family Medicine

## 2023-05-20 ENCOUNTER — Encounter: Payer: Self-pay | Admitting: Urology

## 2023-05-20 ENCOUNTER — Other Ambulatory Visit: Payer: Self-pay | Admitting: Family Medicine

## 2023-05-20 ENCOUNTER — Ambulatory Visit: Payer: Medicare Other | Admitting: Family Medicine

## 2023-05-20 ENCOUNTER — Telehealth: Payer: Self-pay | Admitting: Family Medicine

## 2023-05-20 NOTE — Telephone Encounter (Signed)
 Please call patient: Her CT shows evidence of right pyelonephritis remaining.  Reassuring no abscess is present. I see she has seen urology and they are placing her on a longer course of Bactrim .  Please forward CT result to urology-Dr. Barkley Boot.

## 2023-05-20 NOTE — Telephone Encounter (Signed)
 Imaging sent to Dr. Del Favia.

## 2023-05-20 NOTE — Telephone Encounter (Signed)
 Pt given results

## 2023-05-21 ENCOUNTER — Telehealth: Payer: Self-pay | Admitting: Urology

## 2023-05-21 NOTE — Telephone Encounter (Signed)
Pt called and stated Dr Margo Aye wanted her to speak with you. Please Advise.

## 2023-05-22 ENCOUNTER — Telehealth: Payer: Self-pay

## 2023-05-22 NOTE — Telephone Encounter (Signed)
Spoke with patient regarding results and the need to follow up with Dr Margo Aye. Pt stated that she would be out of town until next week. Pt scheduled for next week; verified appt date/time.

## 2023-05-22 NOTE — Telephone Encounter (Signed)
-----   Message from Joline Maxcy sent at 05/21/2023  1:32 PM EST ----- Regarding: FU CT I called her and left detailed message.  I reviewed the CT scan ordered by her pcp which still shows some abnl in the right kidney.  This probably is resolving inflammation as a result of her pyelonephritis she had in December.  Anyway-- I told her we would work her in sometime this or next week to recheck her urine and send a resolve mdx.  Please call her and set up. Thanks  One more thing.  Tell her not to take her nightly antibiotic the night before we see her.  mch

## 2023-05-28 ENCOUNTER — Other Ambulatory Visit: Payer: Self-pay

## 2023-05-28 ENCOUNTER — Other Ambulatory Visit: Payer: Medicare Other

## 2023-05-28 ENCOUNTER — Emergency Department (HOSPITAL_BASED_OUTPATIENT_CLINIC_OR_DEPARTMENT_OTHER)
Admission: EM | Admit: 2023-05-28 | Discharge: 2023-05-28 | Disposition: A | Discharge status: Home / Self Care | Payer: Medicare Other | Attending: Emergency Medicine | Admitting: Emergency Medicine

## 2023-05-28 ENCOUNTER — Encounter (HOSPITAL_BASED_OUTPATIENT_CLINIC_OR_DEPARTMENT_OTHER): Payer: Self-pay | Admitting: *Deleted

## 2023-05-28 ENCOUNTER — Emergency Department (HOSPITAL_BASED_OUTPATIENT_CLINIC_OR_DEPARTMENT_OTHER): Payer: Medicare Other

## 2023-05-28 ENCOUNTER — Ambulatory Visit: Payer: Self-pay | Admitting: Family Medicine

## 2023-05-28 DIAGNOSIS — Z794 Long term (current) use of insulin: Secondary | ICD-10-CM | POA: Insufficient documentation

## 2023-05-28 DIAGNOSIS — Z7982 Long term (current) use of aspirin: Secondary | ICD-10-CM | POA: Insufficient documentation

## 2023-05-28 DIAGNOSIS — S8991XA Unspecified injury of right lower leg, initial encounter: Secondary | ICD-10-CM | POA: Diagnosis present

## 2023-05-28 DIAGNOSIS — Y9301 Activity, walking, marching and hiking: Secondary | ICD-10-CM | POA: Insufficient documentation

## 2023-05-28 DIAGNOSIS — W01198A Fall on same level from slipping, tripping and stumbling with subsequent striking against other object, initial encounter: Secondary | ICD-10-CM | POA: Insufficient documentation

## 2023-05-28 DIAGNOSIS — M7989 Other specified soft tissue disorders: Secondary | ICD-10-CM | POA: Insufficient documentation

## 2023-05-28 DIAGNOSIS — N390 Urinary tract infection, site not specified: Secondary | ICD-10-CM

## 2023-05-28 DIAGNOSIS — S82024A Nondisplaced longitudinal fracture of right patella, initial encounter for closed fracture: Secondary | ICD-10-CM | POA: Diagnosis not present

## 2023-05-28 MED ORDER — CELECOXIB 200 MG PO CAPS: 200.0000 mg | ORAL_CAPSULE | Freq: Two times a day (BID) | ORAL | 0 refills | Status: DC

## 2023-05-28 NOTE — Telephone Encounter (Signed)
 FYI noted.

## 2023-05-28 NOTE — Telephone Encounter (Signed)
Chief Complaint: Fall Symptoms: unable to bear weight, 10/10 pain, swelling Frequency: Since fall this AM Pertinent Negatives: Patient denies clicking, popping, weakness Disposition: [x] ED /[] Urgent Care (no appt availability in office) / [] Appointment(In office/virtual)/ []  [facility]/ [] Home Care/ [] Refused Recommended Disposition /[] [facility]/ []  Follow-up with PCP Additional Notes: Pt reports she tripped on a rug this AM while dropping her grandson off at daycare, she notes that she landed on her right knee and elbow. She reports she drove home and had been sitting in her chair with it elevated for most of the morning. Pt notes increased swelling and 10/10 pain with movement or weight bearing. Pt states she is unable to get up and walk or bear weight without assistance. Pt to proceed to ED when her husband is home from work at 1600. Pt denies hitting her head. Reports a dime sized skinned area to right knee and a small bruise to right elbow. This RN educated pt on home care, new-worsening symptoms, when to call back/seek emergent care. Pt verbalized understanding and agrees to plan.    Copied from CRM [id number]. Topic: Clinical - Pink Word Triage >> May 28, 2023  3:06 PM Sonny Dandy B wrote: Reason for Triage: pt called stating she fell this am, would like to speak with a triage nurse Reason for Disposition  Can't stand (bear weight) or walk  Answer Assessment - Initial Assessment Questions 1. MECHANISM: "How did the fall happen?"     Tripped on a rug in daycare office this morning when dropping off her grandchildren this morning  3. ONSET: "When did the fall happen?" (e.g., minutes, hours, or days ago)     This AM 4. LOCATION: "What part of the body hit the ground?" (e.g., back, buttocks, head, hips, knees, hands, head, stomach)     "Face planted" on concrete floor, hit knee 5. INJURY: "Did you hurt (injure) yourself when you fell?" If Yes, ask: "What did you injure?  Tell me more about this?" (e.g., body area; type of injury; pain severity)"     Right knee and right arm 6. PAIN: "Is there any pain?" If Yes, ask: "How bad is the pain?" (e.g., Scale 1-10; or mild,  moderate, severe)   - NONE (0): No pain   - MILD (1-3): Doesn't interfere with normal activities    - MODERATE (4-7): Interferes with normal activities or awakens from sleep    - SEVERE (8-10): Excruciating pain, unable to do any normal activities      10/10, with movement or bear weight 7. SIZE: For cuts, bruises, or swelling, ask: "How large is it?" (e.g., inches or centimeters)      Swelling to right knee, dime sized skinned area on her knee, bruise to right arm  9. OTHER SYMPTOMS: "Do you have any other symptoms?" (e.g., dizziness, fever, weakness; new onset or worsening).      None 10. CAUSE: "What do you think caused the fall (or falling)?" (e.g., tripped, dizzy spell)       Tripped on a rug  Answer Assessment - Initial Assessment Questions 1. MECHANISM: "How did the injury happen?" (e.g., twisting injury, direct blow)      Tripped and fell 2. ONSET: "When did the injury happen?" (Minutes or hours ago)      This AM 3. LOCATION: "Where is the injury located?"      Right knee 4. APPEARANCE of INJURY: "What does the injury look like?"      Swelling to  inner right knee 5. SEVERITY: "Can you put weight on that leg?" "Can you walk?"      No 6. SIZE: For cuts, bruises, or swelling, ask: "How large is it?" (e.g., inches or centimeters;  entire joint)      Small bruise to right elbow, dime size skinned area to right knee 7. PAIN: "Is there pain?" If Yes, ask: "How bad is the pain?"  "What does it keep you from doing?" (e.g., Scale 1-10; or mild, moderate, severe)   -  NONE: (0): no pain   -  MILD (1-3): doesn't interfere with normal activities    -  MODERATE (4-7): interferes with normal activities (e.g., work or school) or awakens from sleep, limping    -  SEVERE (8-10): excruciating  pain, unable to do any normal activities, unable to walk     10/10 with movement or wght bearing  9. OTHER SYMPTOMS: "Do you have any other symptoms?"  (e.g., "pop" when knee injured, swelling, locking, buckling)      swelling  Protocols used: Falls and Falling-A-AH, Knee Injury-A-AH

## 2023-05-28 NOTE — Discharge Instructions (Addendum)
Follow up with Ortho in the office.   Take your celebrex as prescribed. Also take tylenol 1000mg (2 extra strength) four times a day.

## 2023-05-28 NOTE — ED Provider Notes (Signed)
[facility] Provider Note   CSN: [id number] Arrival date & time: 05/28/23  1708     History  Chief Complaint  Patient presents with   Laquasia Pincus is a 73 y.o. female.  73 yo F with a chief complaints of fall.  The patient was walking and she tripped over a bulge in her rug.  Ended up landing onto her hands and onto her right knee.  She suffered a small abrasion to the right knee but had ongoing pain and swelling and came to the ED for evaluation.   Fall       Home Medications Prior to Admission medications   Medication Sig Start Date End Date Taking? Authorizing Provider  albuterol (VENTOLIN HFA) 108 (90 Base) MCG/ACT inhaler Inhale 1-2 puffs into the lungs every 6 (six) hours as needed for wheezing or shortness of breath. 04/16/22   Kuneff, Renee A, DO  albuterol (VENTOLIN HFA) 108 (90 Base) MCG/ACT inhaler Inhale 2 puffs into the lungs every 4 (four) hours as needed. Patient not taking: Reported on 05/08/2023    [provider]  amLODipine (NORVASC) 2.5 MG tablet TAKE 1 TABLET BY MOUTH EVERY DAY 05/20/23   Kuneff, Renee A, DO  aspirin EC 81 MG tablet Take 81 mg by mouth daily.    [provider]  celecoxib (CELEBREX) 200 MG capsule Take 1 capsule (200 mg total) by mouth 2 (two) times daily. 05/28/23   Melene Plan, DO  estradiol (ESTRACE) 0.1 MG/GM vaginal cream Apply pea sized amount on fingertip 3x weekly 05/15/23   Joline Maxcy, MD  fluconazole (DIFLUCAN) 150 MG tablet Take 1 tablet (150 mg total) by mouth once a week for 4 doses. 05/08/23 05/30/23  Kuneff, Renee A, DO  fluticasone (FLONASE) 50 MCG/ACT nasal spray Place 2 sprays into both nostrils daily. 04/16/22   Kuneff, Renee A, DO  fluticasone (FLONASE) 50 MCG/ACT nasal spray Place 1 spray into both nostrils daily. Patient not taking: Reported on 05/08/2023    [provider]  HUMALOG KWIKPEN 100 UNIT/ML KwikPen Inject into the skin 3 (three)  times daily. 03/28/22   [provider]  insulin glargine, 1 Unit Dial, (TOUJEO) 300 UNIT/ML Solostar Pen Inject 35 Units into the skin at bedtime. Patient not taking: Reported on 05/08/2023    [provider]  insulin lispro (HUMALOG) 100 UNIT/ML injection Inject into the skin 3 (three) times daily with meals. Patient not taking: Reported on 05/08/2023    [provider]  Meth-Hyo-M Bl-Benz Acd-Ph Sal (URIBEL) 81.6 MG TABS Take 1 tablet (81.6 mg total) by mouth daily. 05/08/23   Kuneff, Renee A, DO  metoprolol succinate (TOPROL-XL) 50 MG 24 hr tablet Take 1 tablet (50 mg total) by mouth daily. 02/04/23   Kuneff, Renee A, DO  Multiple Vitamin (MULTIVITAMIN) capsule Take 1 capsule by mouth daily.     [provider]  nitroGLYCERIN (NITROSTAT) 0.4 MG SL tablet Place under the tongue.  10/18/18   [provider]  omeprazole (PRILOSEC) 20 MG capsule Take 20 mg by mouth daily.    [provider]  ondansetron (ZOFRAN) 8 MG tablet Take 8 mg by mouth every 8 (eight) hours as needed for nausea or vomiting.    [provider]  rosuvastatin (CRESTOR) 20 MG tablet Take 1 tablet (20 mg total) by mouth daily. 10/24/22   Kuneff, Renee A, DO  senna-docusate (SENOKOT-S) 8.6-50 MG tablet Take 1 tablet by  mouth 2 (two) times daily.    [provider]  sulfamethoxazole-trimethoprim (BACTRIM) 400-80 MG tablet Take 1 tablet by mouth at bedtime. 05/15/23   Joline Maxcy, MD  tiZANidine (ZANAFLEX) 4 MG tablet Take 1 tablet (4 mg total) by mouth every 6 (six) hours as needed for muscle spasms. 02/04/23   Kuneff, Renee A, DO  TOUJEO MAX SOLOSTAR 300 UNIT/ML Solostar Pen Inject into the skin. 45 unit sub q PM 04/03/22   [provider]  ursodiol (ACTIGALL) 250 MG tablet PLEASE SEE ATTACHED FOR DETAILED DIRECTIONS 01/24/23   [provider]  valsartan (DIOVAN) 320 MG tablet Take 1 tablet (320 mg total) by mouth daily. 02/04/23   Kuneff, Renee  A, DO      Allergies    Statins    Review of Systems   Review of Systems  Physical Exam Updated Vital Signs BP (!) 173/82 (BP Location: Left Arm)   Pulse 66   Temp 97.8 F (36.6 C)   Resp 18   SpO2 95%  Physical Exam Vitals and nursing note reviewed.  Constitutional:      General: She is not in acute distress.    Appearance: She is well-developed. She is not diaphoretic.  HENT:     Head: Normocephalic and atraumatic.  Eyes:     Pupils: Pupils are equal, round, and reactive to light.  Cardiovascular:     Rate and Rhythm: Normal rate and regular rhythm.     Heart sounds: No murmur heard.    No friction rub. No gallop.  Pulmonary:     Effort: Pulmonary effort is normal.     Breath sounds: No wheezing or rales.  Abdominal:     General: There is no distension.     Palpations: Abdomen is soft.     Tenderness: There is no abdominal tenderness.  Musculoskeletal:        General: No tenderness.     Cervical back: Normal range of motion and neck supple.     Comments: Abrasion to the inferior pole of the patella.  Edema about the knee.  Pulse motor and sensation intact distally.  Skin:    General: Skin is warm and dry.  Neurological:     Mental Status: She is alert and oriented to person, place, and time.  Psychiatric:        Behavior: Behavior normal.     ED Results / Procedures / Treatments   Labs (all labs ordered are listed, but only abnormal results are displayed) Labs Reviewed - No data to display  EKG None  Radiology DG Knee Complete 4 Views Right Result Date: 05/28/2023 CLINICAL DATA:  Knee pain and swelling after fall. Knee pain and swelling after fall. EXAM: RIGHT KNEE - COMPLETE 4+ VIEW COMPARISON:  None Available. FINDINGS: Vertically oriented minimally displaced mid patellar fracture. Moderate knee joint effusion. No additional fracture. The alignment is maintained. Mild tricompartmental osteoarthritis with joint space narrowing and peripheral spurring.  Vascular calcifications are seen. IMPRESSION: 1. Vertically oriented minimally displaced mid patellar fracture. Moderate knee joint effusion. 2. Mild tricompartmental osteoarthritis. Electronically Signed   By: Narda Rutherford M.D.   On: 05/28/2023 19:34    Procedures Procedures    Medications Ordered in ED Medications - No data to display  ED Course/ Medical Decision Making/ A&P  Medical Decision Making Amount and/or Complexity of Data Reviewed Radiology: ordered.  Risk Prescription drug management.   73 yo F with a chief complaints of right knee pain after a fall.  Nonsyncopal by history.  Plain film of the right knee independently interpreted by me with a vertically oriented patella fracture.  She does have difficulty doing a straight leg raise off the bed.  Knee immobilizer she has a walker at home.  Orthopedic follow-up.  On record review she has seen Dr. Lajoyce Corners in the past.  8:58 PM:  I have discussed the diagnosis/risks/treatment options with the patient.  Evaluation and diagnostic testing in the emergency department does not suggest an emergent condition requiring admission or immediate intervention beyond what has been performed at this time.  They will follow up with PCP. We also discussed returning to the ED immediately if new or worsening sx occur. We discussed the sx which are most concerning (e.g., sudden worsening pain, fever, inability to tolerate by mouth) that necessitate immediate return. Medications administered to the patient during their visit and any new prescriptions provided to the patient are listed below.  Medications given during this visit Medications - No data to display   The patient appears reasonably screen and/or stabilized for discharge and I doubt any other medical condition or other [facility] requiring further screening, evaluation, or treatment in the ED at this time prior to discharge.          Final Clinical  Impression(s) / ED Diagnoses Final diagnoses:  Closed nondisplaced longitudinal fracture of right patella, initial encounter    Rx / DC Orders ED Discharge Orders          Ordered    celecoxib (CELEBREX) 200 MG capsule  2 times daily        05/28/23 2052              Melene Plan, DO 05/28/23 2058

## 2023-05-28 NOTE — ED Triage Notes (Signed)
Pt tripped and fell and struck her right knee this am.  Pt continues to have pain and swelling to leg

## 2023-05-28 NOTE — Telephone Encounter (Signed)
First attempt made to reach pt with no answer. A voicemail was left with a request for call back.

## 2023-05-29 ENCOUNTER — Ambulatory Visit: Payer: Medicare Other | Admitting: Urology

## 2023-05-30 ENCOUNTER — Ambulatory Visit: Payer: Medicare Other | Admitting: Orthopedic Surgery

## 2023-05-30 DIAGNOSIS — S82024A Nondisplaced longitudinal fracture of right patella, initial encounter for closed fracture: Secondary | ICD-10-CM | POA: Diagnosis not present

## 2023-05-30 LAB — URINALYSIS
Bilirubin, UA: NEGATIVE
Glucose, UA: NEGATIVE
Ketones, UA: NEGATIVE
Leukocytes,UA: NEGATIVE
Nitrite, UA: NEGATIVE
Protein,UA: NEGATIVE
RBC, UA: NEGATIVE
Specific Gravity, UA: 1.02 (ref 1.005–1.030)
Urobilinogen, Ur: 0.2 mg/dL (ref 0.2–1.0)
pH, UA: 7 (ref 5.0–7.5)

## 2023-05-31 ENCOUNTER — Encounter: Payer: Self-pay | Admitting: Orthopedic Surgery

## 2023-05-31 ENCOUNTER — Telehealth: Payer: Self-pay

## 2023-05-31 ENCOUNTER — Ambulatory Visit: Payer: Medicare Other

## 2023-05-31 NOTE — Progress Notes (Signed)
Office Visit Note   Patient: Julie Osborne           Date of Birth: [birth year]           MRN: [id number] Visit Date: 05/30/2023              Requested by: Natalia Leatherwood, DO [address] [place],  [place] [address] PCP: Natalia Leatherwood, DO  Chief Complaint  Patient presents with   Right Knee - Injury    DOI: 05/28/23      HPI: Patient is a 73 year old woman who is seen for initial evaluation for a longitudinal right patella fracture.  Patient states that she tripped taking her grandson to daycare on February 18.  Patient was placed in a knee immobilizer is seen in the office for initial evaluation.  Patient states she is status post CABG and bariatric surgery.  Assessment & Plan: Visit Diagnoses:  1. Closed nondisplaced longitudinal fracture of right patella, initial encounter     Plan: Patient will use the knee immobilizer for support.  She may remove the knee immobilizer as needed.  Most likely the vertical lucency on the patella is the shadow from the calcified popliteal vessels.  Recommended against driving until she has regained full strength.  Will reevaluate in 4 weeks.  Obtain 2 view radiographs of the right knee at follow-up.  Follow-Up Instructions: No follow-ups on file.   Ortho Exam  Patient is alert, oriented, no adenopathy, well-dressed, normal affect, normal respiratory effort. Examination of patient's radiographs she has calcified popliteal artery in the lateral view with no patella abnormalities in the lateral view.  In the AP view there is a lucent line over the patella which coincides with the calcified popliteal artery.  There is no palpable defect over the patella there is no ecchymosis no bruising.  Patient can do a straight leg raise but lacks about 10 degrees to full extension.  There is no knee effusion.  Clinically the vertical lucent line in the patella does not correlate with the fracture.  Imaging: No results found. No images are attached to the  encounter.  Labs: Lab Results  Component Value Date   HGBA1C 6.9 01/31/2023   HGBA1C 10.2 (H) 04/16/2022   HGBA1C 10.3 02/12/2022   REPTSTATUS 04/15/2023 FINAL 04/13/2023   CULT 30,000 COLONIES/mL ESCHERICHIA COLI (A) 04/13/2023   LABORGA ESCHERICHIA COLI (A) 04/13/2023     Lab Results  Component Value Date   ALBUMIN 3.3 (L) 04/13/2023   ALBUMIN 4.1 01/30/2023   ALBUMIN 4.2 01/04/2021    No results found for: "MG" Lab Results  Component Value Date   VD25OH 39.8 01/30/2023    No results found for: "PREALBUMIN"    Latest Ref Rng & Units 05/08/2023   11:51 AM 04/13/2023    2:31 PM 01/30/2023   12:00 AM  CBC EXTENDED  WBC 4.0 - 10.5 K/uL 9.7  9.3  7.0      RBC 3.87 - 5.11 Mil/uL 4.64  4.31  4.84      Hemoglobin 12.0 - 15.0 g/dL 01.0  27.2  53.6      HCT 36.0 - 46.0 % 42.3  40.3  42      Platelets 150.0 - 400.0 K/uL 270.0  342  220      NEUT# 1.4 - 7.7 K/uL 5.4  6.1  3.50      Lymph# 0.7 - 4.0 K/uL 3.5  2.4       This result is from  an external source.     There is no height or weight on file to calculate BMI.  Orders:  No orders of the defined types were placed in this encounter.  No orders of the defined types were placed in this encounter.    Procedures: No procedures performed  Clinical Data: No additional findings.  ROS:  All other systems negative, except as noted in the HPI. Review of Systems  Objective: Vital Signs: There were no vitals taken for this visit.  Specialty Comments:  No specialty comments available.  PMFS History: Patient Active Problem List   Diagnosis Date Noted   E-coli UTI 04/17/2023   Pyelonephritis 04/17/2023   Osteopenia 12/28/2022   Hyponatremia 08/16/2020   CAD S/P percutaneous coronary angioplasty 12/08/2018   Abnormal cardiovascular stress test 10/16/2018   Crescendo angina (HCC) 10/16/2018   Allergic rhinitis 04/22/2018   Lumbar back pain with radiculopathy affecting lower extremity 04/17/2018   Obesity,  morbid, BMI 40.0-49.9 (HCC) 11/29/2016   Type 2 diabetes mellitus with hyperlipidemia (HCC)    OSA (obstructive sleep apnea)    Hypertension, benign    Hyperlipidemia    Spinal stenosis, lumbar region, without neurogenic claudication 02/06/2007   Past Medical History:  Diagnosis Date   Apnea    Closed fracture of neck of left radius 10/14/2017   Concussion    Degenerative arthritis    generalized/back/LEFT foot   Diabetic neuropathy (HCC) 2017   Diverticulosis 06/19/2013   sigmoid colon on colonoscopy   Hyperlipidemia 2000   on meds   Hypertension, benign 1996   on meds   Lumbar radiculopathy    Severe L4/L5 radiculopathy by old records.    Pes planus 12/17/2011   Posterior tibial tendon dysfunction (PTTD) of left lower extremity 04/10/2015   post tibial tendon dysfunction stage 5 (PTTD)   Situational anxiety 03/30/2020   Tenosynovitis of ankle 2017   post tibial tendon dysfunction stage 5 (PTTD)   Uncontrolled diabetes mellitus 2005   on meds    Family History  Problem Relation Age of Onset   COPD Mother    Diabetes Mellitus II Mother    Heart failure Mother    Osteoarthritis Father    Cancer Father        tumor found behind xyphoid process   Diabetes Sister    Diabetes Brother    Dementia Maternal Grandmother    Heart disease Maternal Grandmother    Heart disease Maternal Grandfather    Stroke Maternal Grandfather    Heart failure Maternal Aunt    Lung cancer Paternal Grandfather    Lung cancer Paternal Aunt    Colon polyps Neg Hx    Colon cancer Neg Hx    Esophageal cancer Neg Hx    Stomach cancer Neg Hx    Rectal cancer Neg Hx     Past Surgical History:  Procedure Laterality Date   CORONARY ANGIOPLASTY WITH STENT PLACEMENT  10/17/2018   DES x2/LAD   EXPLORATORY LAPAROTOMY  1976   endometriosis   MINOR HEMORRHOIDECTOMY  2006   OTHER SURGICAL HISTORY Right 1975   venous ligation   Social History   Occupational History   Occupation: Runner, broadcasting/film/video    Occupation: retired Engineer, civil (consulting)  Tobacco Use   Smoking status: Former    Current packs/day: 0.00    Types: Cigarettes    Quit date: 04/10/2011    Years since quitting: 12.1   Smokeless tobacco: Never  Vaping Use   Vaping status: Never Used  Substance and  Sexual Activity   Alcohol use: No   Drug use: No   Sexual activity: Yes    Partners: Male    Birth control/protection: None    Comment: Married

## 2023-05-31 NOTE — Transitions of Care (Post Inpatient/ED Visit) (Signed)
05/31/2023  Name: Julie Osborne MRN: [id number] DOB: 1951/01/20  Today's TOC FU Call Status: Today's TOC FU Call Status:: Successful TOC FU Call Completed Unsuccessful Call (1st Attempt) Date: 05/31/23 [facility] FU Call Complete Date: 05/28/23 Patient's Name and Date of Birth confirmed.  Transition Care Management Follow-up Telephone Call Date of Discharge: 05/28/23 Discharge Facility: [facility] Type of Discharge: Emergency Department Reason for ED Visit: Other:, Orthopedic Conditions Orthopedic/Injury Diagnosis: Fracture How have you been since you were released from the hospital?: Better Any questions or concerns?: No  Items Reviewed: Did you receive and understand the discharge instructions provided?: Yes Medications obtained,verified, and reconciled?: Yes (Medications Reviewed) Any new allergies since your discharge?: No Dietary orders reviewed?: NA Do you have support at home?: Yes People in Home: spouse  Medications Reviewed Today: Medications Reviewed Today     Reviewed by Filomena Jungling, CMA (Certified Medical Assistant) on 05/31/23 at 1022  Med List Status: <None>   Medication Order Taking? Sig Documenting Provider Last Dose Status Informant  albuterol (VENTOLIN HFA) 108 (90 Base) MCG/ACT inhaler [id number]  Inhale 1-2 puffs into the lungs every 6 (six) hours as needed for wheezing or shortness of breath. Kuneff, Renee A, DO  Active   albuterol (VENTOLIN HFA) 108 (90 Base) MCG/ACT inhaler [id number]  Inhale 2 puffs into the lungs every 4 (four) hours as needed.  Patient not taking: Reported on 05/08/2023   [provider]  Active   amLODipine (NORVASC) 2.5 MG tablet [id number]  TAKE 1 TABLET BY MOUTH EVERY DAY Kuneff, Renee A, DO  Active   aspirin EC 81 MG tablet [id number]  Take 81 mg by mouth daily. [provider]  Active   celecoxib (CELEBREX) 200 MG capsule [id number]  Take 1 capsule (200 mg total) by mouth 2 (two) times daily. Melene Plan, DO   Active   estradiol (ESTRACE) 0.1 MG/GM vaginal cream [id number]  Apply pea sized amount on fingertip 3x weekly Joline Maxcy, MD  Active   fluticasone [facility]) 50 MCG/ACT nasal spray [id number]  Place 2 sprays into both nostrils daily. Kuneff, Renee A, DO  Active   fluticasone (FLONASE) 50 MCG/ACT nasal spray [id number]  Place 1 spray into both nostrils daily.  Patient not taking: Reported on 05/08/2023   [provider]  Active   HUMALOG KWIKPEN 100 UNIT/ML KwikPen [id number]  Inject into the skin 3 (three) times daily. [provider]  Active   insulin glargine, 1 Unit Dial, (TOUJEO) 300 UNIT/ML Solostar Pen [id number]  Inject 35 Units into the skin at bedtime.  Patient not taking: Reported on 05/08/2023   [provider]  Active   insulin lispro (HUMALOG) 100 UNIT/ML injection [id number]  Inject into the skin 3 (three) times daily with meals.  Patient not taking: Reported on 05/08/2023   [provider]  Active   Meth-Hyo-M Bl-Benz Acd-Ph Sal (URIBEL) 81.6 MG TABS [id number]  Take 1 tablet (81.6 mg total) by mouth daily. Kuneff, Renee A, DO  Active   metoprolol succinate (TOPROL-XL) 50 MG 24 hr tablet [id number]  Take 1 tablet (50 mg total) by mouth daily. Kuneff, Renee A, DO  Active   Multiple Vitamin (MULTIVITAMIN) capsule [id number]  Take 1 capsule by mouth daily.  [provider]  Active   nitroGLYCERIN (NITROSTAT) 0.4 MG SL tablet [id number]  Place under the tongue.  [provider]  Active   omeprazole (PRILOSEC) 20 MG capsule [id number]  Take 20 mg by mouth daily. [provider]  Active   ondansetron (ZOFRAN) 8 MG tablet [id number]  Take 8 mg by mouth every 8 (eight) hours as needed for nausea or vomiting. [provider]  Active   rosuvastatin (CRESTOR) 20 MG tablet [id number]  Take 1 tablet (20 mg total) by mouth daily. Kuneff, Renee A, DO  Active   senna-docusate (SENOKOT-S) 8.6-50 MG tablet [id number]  Take 1 tablet by  mouth 2 (two) times daily. [provider]  Active   sulfamethoxazole-trimethoprim (BACTRIM) 400-80 MG tablet [id number]  Take 1 tablet by mouth at bedtime. Joline Maxcy, MD  Active   tiZANidine (ZANAFLEX) 4 MG tablet [id number]  Take 1 tablet (4 mg total) by mouth every 6 (six) hours as needed for muscle spasms. Kuneff, Renee A, DO  Active   TOUJEO MAX SOLOSTAR 300 UNIT/ML Solostar Pen [id number]  Inject into the skin. 45 unit sub q PM [provider]  Active            Med Note [name], REBECCA H   Mon Oct 22, 2022 10:01 AM) Pt taking 20units    ursodiol (ACTIGALL) 250 MG tablet [id number]  PLEASE SEE ATTACHED FOR DETAILED DIRECTIONS [provider]  Active   valsartan (DIOVAN) 320 MG tablet [id number]  Take 1 tablet (320 mg total) by mouth daily. Natalia Leatherwood, DO  Active             Home Care and Equipment/Supplies: Were Home Health Services Ordered?: NA Any new equipment or medical supplies ordered?: NA  Functional Questionnaire: Do you need assistance with bathing/showering or dressing?: No Do you need assistance with meal preparation?: No Do you need assistance with eating?: No Do you have difficulty maintaining continence: No Do you need assistance with getting out of bed/getting out of a chair/moving?: No Do you have difficulty managing or taking your medications?: No  Follow up appointments reviewed: PCP Follow-up appointment confirmed?: NA Specialist Hospital Follow-up appointment confirmed?: Yes Date of Specialist follow-up appointment?: 05/30/23 Do you need transportation to your follow-up appointment?: No Do you understand care options if your condition(s) worsen?: Yes-patient verbalized understanding    SIGNATURE Rocky Crafts

## 2023-06-06 ENCOUNTER — Encounter: Payer: Self-pay | Admitting: Urology

## 2023-06-06 ENCOUNTER — Ambulatory Visit (INDEPENDENT_AMBULATORY_CARE_PROVIDER_SITE_OTHER): Payer: Medicare Other | Admitting: Urology

## 2023-06-06 VITALS — BP 176/93 | HR 71

## 2023-06-06 DIAGNOSIS — N390 Urinary tract infection, site not specified: Secondary | ICD-10-CM

## 2023-06-06 DIAGNOSIS — Z09 Encounter for follow-up examination after completed treatment for conditions other than malignant neoplasm: Secondary | ICD-10-CM

## 2023-06-06 DIAGNOSIS — Z87440 Personal history of urinary (tract) infections: Secondary | ICD-10-CM

## 2023-06-06 DIAGNOSIS — N10 Acute tubulo-interstitial nephritis: Secondary | ICD-10-CM

## 2023-06-06 MED ORDER — NITROFURANTOIN MONOHYD MACRO 100 MG PO CAPS: 100.0000 mg | ORAL_CAPSULE | Freq: Two times a day (BID) | ORAL | 0 refills | Status: DC

## 2023-06-06 NOTE — Progress Notes (Signed)
 Assessment: 1. Recurrent UTI   2. Acute pyelonephritis     Plan: Continue UTI prevention measures Will tx with macrobid x 5 days for low counts of enterococcus FU as scheduled with renal US prior to visit to document resolution of renal abnl.  Chief Complaint: FU UTI  HPI: Julie Osborne is a 73 y.o. female who presents for continued evaluation of recurrent UTI. Please see my note 05/15/2023 at the time of initial visit for detailed history.  Briefly the patient has a history of recurrent uncomplicated UTIs over the last several years but also a recent episode of pyelonephritis/urosepsis in December 2025. When I saw her in February she was started on UTI prevention measures including vaginal estrogen cream, urinary probiotic and was also started on low-dose Bactrim suppression with plans to keep her on that for the next several months.  PCP also had ordered a follow-up CT scan which continued to show some perfusion defects within the right kidney consistent with her recent pyelonephritis.  Patient presents here today for follow-up.  She was recently seen in the emergency room following a fall and injured her knee. She is doing very well from a urologic standpoint.  She has had no recurrent UTI symptoms.  I had her come in last week for urinalysis which was entirely normal but did send a resolve MDX culture which showed low colony counts of Enterococcus.   Portions of the above documentation were copied from a prior visit for review purposes only.  Allergies: Allergies  Allergen Reactions   Statins     Lipitor,simvatatin, pravastatin.welchol cause myalgia    PMH: Past Medical History:  Diagnosis Date   Apnea    Closed fracture of neck of left radius 10/14/2017   Concussion    Degenerative arthritis    generalized/back/LEFT foot   Diabetic neuropathy (HCC) 2017   Diverticulosis 06/19/2013   sigmoid colon on colonoscopy   Hyperlipidemia 2000   on meds   Hypertension, benign  1996   on meds   Lumbar radiculopathy    Severe L4/L5 radiculopathy by old records.    Pes planus 12/17/2011   Posterior tibial tendon dysfunction (PTTD) of left lower extremity 04/10/2015   post tibial tendon dysfunction stage 5 (PTTD)   Situational anxiety 03/30/2020   Tenosynovitis of ankle 2017   post tibial tendon dysfunction stage 5 (PTTD)   Uncontrolled diabetes mellitus 2005   on meds    PSH: Past Surgical History:  Procedure Laterality Date   CORONARY ANGIOPLASTY WITH STENT PLACEMENT  10/17/2018   DES x2/LAD   EXPLORATORY LAPAROTOMY  1976   endometriosis   MINOR HEMORRHOIDECTOMY  2006   OTHER SURGICAL HISTORY Right 1975   venous ligation    SH: Social History   Tobacco Use   Smoking status: Former    Current packs/day: 0.00    Types: Cigarettes    Quit date: 04/10/2011    Years since quitting: 12.1   Smokeless tobacco: Never  Vaping Use   Vaping status: Never Used  Substance Use Topics   Alcohol use: No   Drug use: No    ROS: Constitutional:  Negative for fever, chills, weight loss CV: Negative for chest pain, previous MI, hypertension Respiratory:  Negative for shortness of breath, wheezing, sleep apnea, frequent cough GI:  Negative for nausea, vomiting, bloody stool, GERD  PE: Vitals:   06/06/23 1039  BP: (!) 176/93  Pulse: 71    GENERAL APPEARANCE:  Well appearing, well developed, well nourished, NAD

## 2023-06-21 ENCOUNTER — Other Ambulatory Visit: Payer: Self-pay

## 2023-06-21 MED ORDER — AMLODIPINE BESYLATE 2.5 MG PO TABS
2.5000 mg | ORAL_TABLET | Freq: Every day | ORAL | 0 refills | Status: DC
Start: 2023-06-21 — End: 2023-07-15

## 2023-06-27 ENCOUNTER — Ambulatory Visit: Payer: Medicare Other | Admitting: Orthopedic Surgery

## 2023-06-27 ENCOUNTER — Other Ambulatory Visit (INDEPENDENT_AMBULATORY_CARE_PROVIDER_SITE_OTHER): Payer: Self-pay

## 2023-06-27 DIAGNOSIS — S82024A Nondisplaced longitudinal fracture of right patella, initial encounter for closed fracture: Secondary | ICD-10-CM | POA: Diagnosis not present

## 2023-06-28 ENCOUNTER — Encounter: Payer: Self-pay | Admitting: Orthopedic Surgery

## 2023-06-28 NOTE — Progress Notes (Signed)
 Office Visit Note   Patient: Julie Osborne           Date of Birth: [birth year]           MRN: [id number] Visit Date: 06/27/2023              Requested by: Natalia Leatherwood, DO [address] [place],  [place] [address] PCP: Natalia Leatherwood, DO  Chief Complaint  Patient presents with   Right Knee - Follow-up      HPI: Patient is a 73 year old woman who presents in follow-up for closed nondisplaced right patella fracture.  She uses the knee immobilizer for long distances.  Otherwise is currently wearing a sleeve.  Assessment & Plan: Visit Diagnoses:  1. Closed nondisplaced longitudinal fracture of right patella, initial encounter     Plan: Will set patient up for physical therapy at [facility] therapy in [facility] for strengthening of her knee.  Follow-Up Instructions: No follow-ups on file.   Ortho Exam  Patient is alert, oriented, no adenopathy, well-dressed, normal affect, normal respiratory effort. Examination patient has full active extension of the right knee without extensor lag.  There is no tenderness to palpation no palpable defect.  No effusion.  The patella is not tender to palpation.  Patient does have tenderness to palpation over the medial patellofemoral ligament.  There is no instability of the patella.  Imaging: No results found. No images are attached to the encounter.  Labs: Lab Results  Component Value Date   HGBA1C 6.9 01/31/2023   HGBA1C 10.2 (H) 04/16/2022   HGBA1C 10.3 02/12/2022   REPTSTATUS 04/15/2023 FINAL 04/13/2023   CULT 30,000 COLONIES/mL ESCHERICHIA COLI (A) 04/13/2023   LABORGA ESCHERICHIA COLI (A) 04/13/2023     Lab Results  Component Value Date   ALBUMIN 3.3 (L) 04/13/2023   ALBUMIN 4.1 01/30/2023   ALBUMIN 4.2 01/04/2021    No results found for: "MG" Lab Results  Component Value Date   VD25OH 39.8 01/30/2023    No results found for: "PREALBUMIN"    Latest Ref Rng & Units 05/08/2023   11:51 AM 04/13/2023    2:31 PM 01/30/2023    12:00 AM  CBC EXTENDED  WBC 4.0 - 10.5 K/uL 9.7  9.3  7.0      RBC 3.87 - 5.11 Mil/uL 4.64  4.31  4.84      Hemoglobin 12.0 - 15.0 g/dL 72.5  36.6  44.0      HCT 36.0 - 46.0 % 42.3  40.3  42      Platelets 150.0 - 400.0 K/uL 270.0  342  220      NEUT# 1.4 - 7.7 K/uL 5.4  6.1  3.50      Lymph# 0.7 - 4.0 K/uL 3.5  2.4       This result is from an external source.     There is no height or weight on file to calculate BMI.  Orders:  Orders Placed This Encounter  Procedures   XR Knee 1-2 Views Right   Ambulatory referral to Physical Therapy   No orders of the defined types were placed in this encounter.    Procedures: No procedures performed  Clinical Data: No additional findings.  ROS:  All other systems negative, except as noted in the HPI. Review of Systems  Objective: Vital Signs: There were no vitals taken for this visit.  Specialty Comments:  No specialty comments available.  PMFS History: Patient Active Problem List   Diagnosis Date  Noted   E-coli UTI 04/17/2023   Pyelonephritis 04/17/2023   Osteopenia 12/28/2022   Hyponatremia 08/16/2020   CAD S/P percutaneous coronary angioplasty 12/08/2018   Abnormal cardiovascular stress test 10/16/2018   Crescendo angina (HCC) 10/16/2018   Allergic rhinitis 04/22/2018   Lumbar back pain with radiculopathy affecting lower extremity 04/17/2018   Obesity, morbid, BMI 40.0-49.9 (HCC) 11/29/2016   Type 2 diabetes mellitus with hyperlipidemia (HCC)    OSA (obstructive sleep apnea)    Hypertension, benign    Hyperlipidemia    Spinal stenosis, lumbar region, without neurogenic claudication 02/06/2007   Past Medical History:  Diagnosis Date   Apnea    Closed fracture of neck of left radius 10/14/2017   Concussion    Degenerative arthritis    generalized/back/LEFT foot   Diabetic neuropathy (HCC) 2017   Diverticulosis 06/19/2013   sigmoid colon on colonoscopy   Hyperlipidemia 2000   on meds   Hypertension, benign  1996   on meds   Lumbar radiculopathy    Severe L4/L5 radiculopathy by old records.    Pes planus 12/17/2011   Posterior tibial tendon dysfunction (PTTD) of left lower extremity 04/10/2015   post tibial tendon dysfunction stage 5 (PTTD)   Situational anxiety 03/30/2020   Tenosynovitis of ankle 2017   post tibial tendon dysfunction stage 5 (PTTD)   Uncontrolled diabetes mellitus 2005   on meds    Family History  Problem Relation Age of Onset   COPD Mother    Diabetes Mellitus II Mother    Heart failure Mother    Osteoarthritis Father    Cancer Father        tumor found behind xyphoid process   Diabetes Sister    Diabetes Brother    Dementia Maternal Grandmother    Heart disease Maternal Grandmother    Heart disease Maternal Grandfather    Stroke Maternal Grandfather    Heart failure Maternal Aunt    Lung cancer Paternal Grandfather    Lung cancer Paternal Aunt    Colon polyps Neg Hx    Colon cancer Neg Hx    Esophageal cancer Neg Hx    Stomach cancer Neg Hx    Rectal cancer Neg Hx     Past Surgical History:  Procedure Laterality Date   CORONARY ANGIOPLASTY WITH STENT PLACEMENT  10/17/2018   DES x2/LAD   EXPLORATORY LAPAROTOMY  1976   endometriosis   MINOR HEMORRHOIDECTOMY  2006   OTHER SURGICAL HISTORY Right 1975   venous ligation   Social History   Occupational History   Occupation: Runner, broadcasting/film/video   Occupation: retired Engineer, civil (consulting)  Tobacco Use   Smoking status: Former    Current packs/day: 0.00    Types: Cigarettes    Quit date: 04/10/2011    Years since quitting: 12.2   Smokeless tobacco: Never  Vaping Use   Vaping status: Never Used  Substance and Sexual Activity   Alcohol use: No   Drug use: No   Sexual activity: Yes    Partners: Male    Birth control/protection: None    Comment: Married

## 2023-07-02 DIAGNOSIS — Z1211 Encounter for screening for malignant neoplasm of colon: Secondary | ICD-10-CM

## 2023-07-11 ENCOUNTER — Other Ambulatory Visit: Payer: Self-pay

## 2023-07-11 ENCOUNTER — Ambulatory Visit: Payer: Medicare Other | Admitting: Family Medicine

## 2023-07-11 ENCOUNTER — Ambulatory Visit: Attending: Orthopedic Surgery

## 2023-07-11 DIAGNOSIS — R262 Difficulty in walking, not elsewhere classified: Secondary | ICD-10-CM | POA: Diagnosis present

## 2023-07-11 DIAGNOSIS — S82024A Nondisplaced longitudinal fracture of right patella, initial encounter for closed fracture: Secondary | ICD-10-CM | POA: Diagnosis not present

## 2023-07-11 DIAGNOSIS — M25561 Pain in right knee: Secondary | ICD-10-CM | POA: Diagnosis present

## 2023-07-11 DIAGNOSIS — R29898 Other symptoms and signs involving the musculoskeletal system: Secondary | ICD-10-CM | POA: Insufficient documentation

## 2023-07-11 NOTE — Therapy (Signed)
 OUTPATIENT PHYSICAL THERAPY LOWER EXTREMITY EVALUATION   Patient Name: Julie Osborne MRN: [id number] DOB:06/14/1950, 73 y.o., female Today's Date: 07/11/2023  END OF SESSION:  PT End of Session - 07/11/23 1359     Visit Number 1    Date for PT Re-Evaluation 09/05/23    Progress Note Due on Visit 10    PT Start Time 1400    PT Stop Time 1445    PT Time Calculation (min) 45 min    Activity Tolerance Patient tolerated treatment well    Behavior During Therapy [facility] for tasks assessed/performed             Past Medical History:  Diagnosis Date   Apnea    Closed fracture of neck of left radius 10/14/2017   Concussion    Degenerative arthritis    generalized/back/LEFT foot   Diabetic neuropathy (HCC) 2017   Diverticulosis 06/19/2013   sigmoid colon on colonoscopy   Hyperlipidemia 2000   on meds   Hypertension, benign 1996   on meds   Lumbar radiculopathy    Severe L4/L5 radiculopathy by old records.    Pes planus 12/17/2011   Posterior tibial tendon dysfunction (PTTD) of left lower extremity 04/10/2015   post tibial tendon dysfunction stage 5 (PTTD)   Situational anxiety 03/30/2020   Tenosynovitis of ankle 2017   post tibial tendon dysfunction stage 5 (PTTD)   Uncontrolled diabetes mellitus 2005   on meds   Past Surgical History:  Procedure Laterality Date   CORONARY ANGIOPLASTY WITH STENT PLACEMENT  10/17/2018   DES x2/LAD   EXPLORATORY LAPAROTOMY  1976   endometriosis   MINOR HEMORRHOIDECTOMY  2006   OTHER SURGICAL HISTORY Right 1975   venous ligation   Patient Active Problem List   Diagnosis Date Noted   E-coli UTI 04/17/2023   Pyelonephritis 04/17/2023   Osteopenia 12/28/2022   Hyponatremia 08/16/2020   CAD S/P percutaneous coronary angioplasty 12/08/2018   Abnormal cardiovascular stress test 10/16/2018   Crescendo angina (HCC) 10/16/2018   Allergic rhinitis 04/22/2018   Lumbar back pain with radiculopathy affecting lower extremity 04/17/2018    Obesity, morbid, BMI 40.0-49.9 (HCC) 11/29/2016   Type 2 diabetes mellitus with hyperlipidemia (HCC)    OSA (obstructive sleep apnea)    Hypertension, benign    Hyperlipidemia    Spinal stenosis, lumbar region, without neurogenic claudication 02/06/2007    PCP: Felix Pacini, DO  REFERRING PROVIDER: Aldean Baker, MD  REFERRING DIAG:longitudinal fracture R patella due to fall  THERAPY DIAG:  Acute pain of right knee  Difficulty in walking, not elsewhere classified  Right leg weakness  Rationale for Evaluation and Treatment: Rehabilitation  ONSET DATE: 05/28/23  SUBJECTIVE:   SUBJECTIVE STATEMENT: I had UTI around December, was hospitalized in [place] and they gave me this rollator, then I feel in lobby of my grandson's [employer] on my knee, fractured it.  I have some pain in my knee, notice it more when I walk with cane or crutch, only single hand support, feel like I can manage my endurance and my pain better with the rollator but the rollator is too short I think and my back hurts.  PERTINENT HISTORY: Larey Seat about 5 weeks ago on R knee, longitudinal R patellar fx PAIN:  Are you having pain? Yes: NPRS scale: 0 to7 Pain location: R medial knee along jt line Pain description: aches, Aggravating factors: walking too far Relieving factors: resting  PRECAUTIONS: Fall  RED FLAGS: None   WEIGHT BEARING RESTRICTIONS: No  FALLS:  Has patient fallen in last 6 months? Yes. 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   NEXT MD VISIT: unclear  OBJECTIVE:  Note: Objective measures  were completed at Evaluation unless otherwise noted.  DIAGNOSTIC FINDINGS: see ortho notes in EPIc  PATIENT SURVEYS:  Tegner Lysholm Knee Score: 34 / 100 = 34.0 %  COGNITION: Overall cognitive status: Within functional limits for tasks assessed     SENSATION: WFL  EDEMA:  NONE noted today but pt reports edema with prolonged walking    POSTURE:  note collapsed, valgum L foot, very lean B LE's, tends to ER B feet , no antalgia noted  PALPATION: Tender along medial border R patella and medial jt line  LOWER EXTREMITY ROM:R knee ROM wfl, equal to L   LOWER EXTREMITY MMT: R long arc quad with normal MMT Pain and weakness, fasciculations noted with R SLR, lag of 12 degrees Plantarflexor weakness B 3+ /5   5 times sit to stand: 38s 6 minute walk test: unable to complete, due to pain R knee with prolonged walker greater than 150'  GAIT: Distance walked: 41' in clinic Assistive device utilized: Walker - 4 wheeled Level of assistance: Modified independence Comments: rollator too short, by 1-2 ".  Used rollator in our clinic at appropriate height and pt more comfortable.  Unable to raise rollator handles any more                                                                                                                                TREATMENT DATE: 07/11/23 Eval, ins in SLR, using theraband to assist  Inst in post wall leans for quads strength    PATIENT EDUCATION:  Education details: POC, goals Person educated: Patient Education method: Explanation, Demonstration, Tactile cues, and Verbal cues Education comprehension: verbalized understanding, returned demonstration, and verbal cues required  HOME EXERCISE PROGRAM: Post leans with heel rocks to cue B quads Supine SLR  ASSESSMENT:  CLINICAL IMPRESSION: Patient is a 73 y.o. female who was evaluated today by physical therapy due to injuries R knee due to a fall in February, with resultant pain and longitudinal fx R  patella.  She is motivated, was utilizing cane prior to this injury due to chronic bladder infections and generalized weakness. Primary deficits are distal quads strength R with resultant lag with SLR, also poor tolerance for gait .  She also indicates concern with her balance and would like to address as well. We have a short window in which to work with her due  to her upcoming travel plans  OBJECTIVE IMPAIRMENTS: decreased activity tolerance, decreased balance, decreased endurance, difficulty walking, decreased strength, impaired perceived functional ability, and pain.   ACTIVITY LIMITATIONS: carrying, lifting, standing, transfers, and locomotion level  PARTICIPATION LIMITATIONS: meal prep, laundry, shopping, community activity, and yard work  PERSONAL FACTORS: Past/current experiences, Time since onset of injury/illness/exacerbation, and 1-2 comorbidities: peripheral neuropathy, frequent falls,  are also affecting patient's functional outcome.   REHAB POTENTIAL: Good  CLINICAL DECISION MAKING: Evolving/moderate complexity  EVALUATION COMPLEXITY: Moderate   GOALS: Goals reviewed with patient? No  SHORT TERM GOALS: Target date: 2 weeks 07/25/23 I HEP Baseline: Goal status: INITIAL   LONG TERM GOALS: Target date: 09/05/23 8 weeks  Full strength R quads, SLR consistent greater than 15 reps without lag Baseline: 12 degree lag Goal status: INITIAL  2.  5 x sit to stand 20 sec or less Baseline: 38 sec  Goal status: INITIAL  3.  Gait 6 min walk test 800' or greater with st cane , I  Baseline: 150' currently with rollator Goal status: INITIAL  4.  Complete FGA and set goals when appropriate Baseline:  Goal status: INITIAL    PLAN:  PT FREQUENCY: 2x/week  PT DURATION: 8 weeks  PLANNED INTERVENTIONS: 97110-Therapeutic exercises, 97530- Therapeutic activity, 97112- Neuromuscular re-education, 97535- Self Care, and 16109- Manual therapy  PLAN FOR NEXT SESSION: continue  therex, would avoid much resistance training with R knee flexed due to compression forces on patella, consider NMES R quads, utilize cardiovascular equipment   Kamal Jurgens L Walid Haig, PT, DPT, OCS 07/11/2023, 6:38 PM

## 2023-07-17 ENCOUNTER — Encounter: Payer: Self-pay | Admitting: Family Medicine

## 2023-07-17 ENCOUNTER — Ambulatory Visit (INDEPENDENT_AMBULATORY_CARE_PROVIDER_SITE_OTHER): Admitting: Family Medicine

## 2023-07-17 VITALS — BP 148/68 | HR 60 | Temp 97.9°F | Wt 221.8 lb

## 2023-07-17 DIAGNOSIS — D126 Benign neoplasm of colon, unspecified: Secondary | ICD-10-CM

## 2023-07-17 DIAGNOSIS — Z9861 Coronary angioplasty status: Secondary | ICD-10-CM

## 2023-07-17 DIAGNOSIS — M8589 Other specified disorders of bone density and structure, multiple sites: Secondary | ICD-10-CM | POA: Diagnosis not present

## 2023-07-17 DIAGNOSIS — I10 Essential (primary) hypertension: Secondary | ICD-10-CM | POA: Diagnosis not present

## 2023-07-17 DIAGNOSIS — E782 Mixed hyperlipidemia: Secondary | ICD-10-CM

## 2023-07-17 DIAGNOSIS — E785 Hyperlipidemia, unspecified: Secondary | ICD-10-CM

## 2023-07-17 DIAGNOSIS — I2510 Atherosclerotic heart disease of native coronary artery without angina pectoris: Secondary | ICD-10-CM

## 2023-07-17 DIAGNOSIS — E1169 Type 2 diabetes mellitus with other specified complication: Secondary | ICD-10-CM | POA: Diagnosis not present

## 2023-07-17 LAB — LIPID PANEL
Cholesterol: 173 mg/dL (ref 0–200)
HDL: 53.7 mg/dL (ref >39.00)
LDL Cholesterol: 98 mg/dL (ref 0–99)
NonHDL: 119.75
Total CHOL/HDL Ratio: 3
Triglycerides: 107 mg/dL (ref 0.0–149.0)
VLDL: 21.4 mg/dL (ref 0.0–40.0)

## 2023-07-17 LAB — HEMOGLOBIN A1C: Hgb A1c MFr Bld: 6.8 % — ABNORMAL HIGH (ref 4.6–6.5)

## 2023-07-17 LAB — TSH: TSH: 1.32 u[IU]/mL (ref 0.35–5.50)

## 2023-07-17 MED ORDER — TIZANIDINE HCL 4 MG PO TABS: 4.0000 mg | ORAL_TABLET | Freq: Four times a day (QID) | ORAL | 5 refills | Status: DC | PRN

## 2023-07-17 MED ORDER — FLUTICASONE PROPIONATE 50 MCG/ACT NA SUSP: 2.0000 | Freq: Every day | NASAL | 11 refills | Status: AC

## 2023-07-17 MED ORDER — ALBUTEROL SULFATE HFA 108 (90 BASE) MCG/ACT IN AERS: 1.0000 | INHALATION_SPRAY | Freq: Four times a day (QID) | RESPIRATORY_TRACT | 11 refills | Status: AC | PRN

## 2023-07-17 MED ORDER — METOPROLOL SUCCINATE ER 50 MG PO TB24: 50.0000 mg | ORAL_TABLET | Freq: Every day | ORAL | 1 refills | Status: DC

## 2023-07-17 MED ORDER — VALSARTAN 320 MG PO TABS: 320.0000 mg | ORAL_TABLET | Freq: Every day | ORAL | 1 refills | Status: DC

## 2023-07-17 MED ORDER — ROSUVASTATIN CALCIUM 20 MG PO TABS: 20.0000 mg | ORAL_TABLET | Freq: Every day | ORAL | 3 refills | Status: AC

## 2023-07-17 MED ORDER — AMLODIPINE BESYLATE 5 MG PO TABS: 5.0000 mg | ORAL_TABLET | Freq: Every day | ORAL | 0 refills | Status: DC

## 2023-07-17 NOTE — Progress Notes (Signed)
 Julie Osborne , 02-27-1951, 73 y.o., female MRN: [id number] Patient Care Team    Relationship Specialty Notifications Start End  Natalia Leatherwood, DO PCP - General Family Medicine  01/13/16   Resa Miner, DPM Referring Physician Podiatry  12/24/19   Nadara Mustard, MD Consulting Physician Orthopedic Surgery  08/16/20   Dietels, Delanna Notice, OD  Optometry  07/08/21   Joline Maxcy, MD Referring Physician Urology  07/17/23   Maggie Font, FNP Nurse Practitioner Endocrinology  07/17/23    Comment: [facility]  [address]  [place], [place] [address]  [phone]    Chief Complaint  Patient presents with   Hypertension    Pt is fasting.      Subjective:Julie Osborne is a 73 y.o. female patient present for Chronic Conditions/illness Management after recent lap band procedure.   HTN/hyperlipidemia: Pt underwent reports compliance with metoprolol 50 mg daily, Crestor, diovan 320 mg qd and amlodipine 2.5 mg daily Prior meds; amlodipine 10, valsartan-hctz 10-320-25 Patient denies chest pain, shortness of breath, dizziness or lower extremity edema.      07/17/2023    8:09 AM 05/08/2023   11:10 AM 10/24/2022   10:37 AM 08/01/2022    8:41 AM 06/25/2022   11:33 AM  Depression screen PHQ 2/9  Decreased Interest 0 0 0 0 0  Down, Depressed, Hopeless 0 0 0 0 0  PHQ - 2 Score 0 0 0 0 0  Altered sleeping 0 0 0    Tired, decreased energy 0 0 0    Change in appetite 0 0 0    Feeling bad or failure about yourself  0 0 0    Trouble concentrating 0 0 0    Moving slowly or fidgety/restless 0 0 0    Suicidal thoughts 0 0 0    PHQ-9 Score 0 0 0    Difficult doing work/chores Not difficult at all Not difficult at all Not difficult at all      Allergies  Allergen Reactions   Statins     Lipitor,simvatatin, pravastatin.welchol cause myalgia   Social History   Social History Narrative   Married to [employer], 2 children.    BS, RN  retired now Agricultural consultant.    Drinks caffeine, uses herbal remedies. Daily vitmain use.   Wears her seatbelt, smoke detector in the home.    exercises routinely.    She feels safe in her relationships.    Past Medical History:  Diagnosis Date   Apnea    Closed fracture of neck of left radius 10/14/2017   Concussion    Degenerative arthritis    generalized/back/LEFT foot   Diabetic neuropathy (HCC) 2017   Diverticulosis 06/19/2013   sigmoid colon on colonoscopy   E-coli UTI 04/17/2023   Hyperlipidemia 2000   on meds   Hypertension, benign 1996   on meds   Lumbar radiculopathy    Severe L4/L5 radiculopathy by old records.    OSA (obstructive sleep apnea)    Pes planus 12/17/2011   Posterior tibial tendon dysfunction (PTTD) of left lower extremity 04/10/2015   post tibial tendon dysfunction stage 5 (PTTD)   Pyelonephritis 04/17/2023   Situational anxiety 03/30/2020   Tenosynovitis of ankle 2017   post tibial tendon dysfunction stage 5 (PTTD)   Uncontrolled diabetes mellitus 2005   on meds   Past Surgical History:  Procedure Laterality Date   CORONARY ANGIOPLASTY WITH STENT PLACEMENT  10/17/2018   DES x2/LAD   EXPLORATORY LAPAROTOMY  1976   endometriosis   MINOR HEMORRHOIDECTOMY  2006   OTHER SURGICAL HISTORY Right 1975   venous ligation   Family History  Problem Relation Age of Onset   COPD Mother    Diabetes Mellitus II Mother    Heart failure Mother    Osteoarthritis Father    Cancer Father        tumor found behind xyphoid process   Diabetes Sister    Diabetes Brother    Dementia Maternal Grandmother    Heart disease Maternal Grandmother    Heart disease Maternal Grandfather    Stroke Maternal Grandfather    Heart failure Maternal Aunt    Lung cancer Paternal Grandfather    Lung cancer Paternal Aunt    Colon polyps Neg Hx    Colon cancer Neg Hx    Esophageal cancer Neg Hx    Stomach cancer Neg Hx    Rectal cancer Neg Hx    Allergies as of 07/17/2023        Reactions   Statins    Lipitor,simvatatin, pravastatin.welchol cause myalgia        Medication List        Accurate as of July 17, 2023  8:29 AM. If you have any questions, ask your nurse or doctor.          STOP taking these medications    celecoxib 200 MG capsule Commonly known as: CeleBREX Stopped by: Felix Pacini   HumaLOG KwikPen 100 UNIT/ML KwikPen Generic drug: insulin lispro Stopped by: Felix Pacini   insulin lispro 100 UNIT/ML injection Commonly known as: HUMALOG Stopped by: Felix Pacini   nitrofurantoin (macrocrystal-monohydrate) 100 MG capsule Commonly known as: MACROBID Stopped by: Felix Pacini   Uribel 81.6 MG Tabs Generic drug: Meth-Hyo-M Bl-Benz Acd-Ph Sal Stopped by: Felix Pacini   ursodiol 250 MG tablet Commonly known as: ACTIGALL Stopped by: Felix Pacini       TAKE these medications    albuterol 108 (90 Base) MCG/ACT inhaler Commonly known as: VENTOLIN HFA Inhale 1-2 puffs into the lungs every 6 (six) hours as needed for wheezing or shortness of breath. What changed: Another medication with the same name was removed. Continue taking this medication, and follow the directions you see here. Changed by: Felix Pacini   amLODipine 5 MG tablet Commonly known as: NORVASC Take 1 tablet (5 mg total) by mouth daily. What changed:  medication strength how much to take Changed by: Felix Pacini   aspirin EC 81 MG tablet Take 81 mg by mouth daily.   estradiol 0.1 MG/GM vaginal cream Commonly known as: ESTRACE Apply pea sized amount on fingertip 3x weekly   fluticasone 50 MCG/ACT nasal spray Commonly known as: FLONASE Place 2 sprays into both nostrils daily. What changed: Another medication with the same name was removed. Continue taking this medication, and follow the directions you see here. Changed by: Felix Pacini   metoprolol succinate 50 MG 24 hr tablet Commonly known as: TOPROL-XL Take 1 tablet (50 mg total) by mouth daily.    multivitamin capsule Take 1 capsule by mouth daily.   nitroGLYCERIN 0.4 MG SL tablet Commonly known as: NITROSTAT Place under the tongue.   omeprazole 20 MG capsule Commonly known as: PRILOSEC Take 20 mg by mouth daily.   ondansetron 8 MG tablet Commonly known as: ZOFRAN Take 8 mg by mouth every 8 (eight) hours as needed for nausea or vomiting.   rosuvastatin 20  MG tablet Commonly known as: CRESTOR Take 1 tablet (20 mg total) by mouth daily.   senna-docusate 8.6-50 MG tablet Commonly known as: Senokot-S Take 1 tablet by mouth 2 (two) times daily.   sulfamethoxazole-trimethoprim 400-80 MG tablet Commonly known as: Bactrim Take 1 tablet by mouth at bedtime.   tiZANidine 4 MG tablet Commonly known as: ZANAFLEX Take 1 tablet (4 mg total) by mouth every 6 (six) hours as needed for muscle spasms.   Toujeo Max SoloStar 300 UNIT/ML Solostar Pen Generic drug: insulin glargine (2 Unit Dial) Inject into the skin. 45 unit sub q PM What changed: Another medication with the same name was removed. Continue taking this medication, and follow the directions you see here. Changed by: Felix Pacini   valsartan 320 MG tablet Commonly known as: DIOVAN Take 1 tablet (320 mg total) by mouth daily.   Vitamin D (Ergocalciferol) 1.25 MG (50000 UNIT) Caps capsule Commonly known as: DRISDOL Take 50,000 Units by mouth once a week.        All past medical history, surgical history, allergies, family history, immunizations andmedications were updated in the EMR today and reviewed under the history and medication portions of their EMR.     ROS: Negative, with the exception of above mentioned in HPI  Objective:  BP (!) 148/68   Pulse 60   Temp 97.9 F (36.6 C)   Wt 221 lb 12.8 oz (100.6 kg)   SpO2 97%   BMI 32.28 kg/m  Body mass index is 32.28 kg/m. Physical Exam Vitals and nursing note reviewed.  Constitutional:      General: She is not in acute distress.    Appearance: Normal  appearance. She is not ill-appearing, toxic-appearing or diaphoretic.  HENT:     Head: Normocephalic and atraumatic.  Eyes:     General: No scleral icterus.       Right eye: No discharge.        Left eye: No discharge.     Extraocular Movements: Extraocular movements intact.     Conjunctiva/sclera: Conjunctivae normal.     Pupils: Pupils are equal, round, and reactive to light.  Cardiovascular:     Rate and Rhythm: Normal rate and regular rhythm.     Heart sounds: Murmur heard.  Pulmonary:     Effort: Pulmonary effort is normal. No respiratory distress.     Breath sounds: Normal breath sounds. No wheezing, rhonchi or rales.  Musculoskeletal:     Right lower leg: No edema.     Left lower leg: No edema.  Skin:    General: Skin is warm.     Findings: No rash.  Neurological:     Mental Status: She is alert and oriented to person, place, and time. Mental status is at baseline.     Motor: No weakness.     Gait: Gait normal.  Psychiatric:        Mood and Affect: Mood normal.        Behavior: Behavior normal.        Thought Content: Thought content normal.        Judgment: Judgment normal.      No results found. No results found. No results found for this or any previous visit (from the past 24 hours).   Assessment/Plan: Julie Osborne is a 73 y.o. female present for OV for  Hypertension, benign/HLD/coronary artery disease/allergic to statins/CAD S/P percutaneous coronary angioplasty/bradycardia Above goal Goal < 130/80 Continue valsartan 320 mg daily  decrease metoprolol 50 mg  increase Amlodipine 2.5 > 5mg  every day Dc'd ASA 81(bariatric) Statin allergy - low sodium, exercise.  - routine exercise encouraged.  Lipid and tsh collected   Lumbar back pain with radiculopathy affecting lower extremity/Spinal stenosis, lumbar region, without neurogenic claudication Stable Continue Zanaflex prescribed flares.  Prior meds: cymbalta  Osteopenia of multiple sites Continue  fosamax 35 mg weekly.  Dexa rpt 2-3 yrs 2026  type 2 diabetes mellitus with hyperlipidemia (HCC) Now managed by endo. Last A1c 6.9 A1c collected today   Return in about 24 weeks (around 01/01/2024) for Routine chronic condition follow-up.   Reviewed expectations re: course of current medical issues. Discussed self-management of symptoms. Outlined signs and symptoms indicating need for more acute intervention. Patient verbalized understanding and all questions were answered. Patient received an After-Visit Summary.    Orders Placed This Encounter  Procedures   TSH   Hemoglobin A1c   Lipid panel   Ambulatory referral to Gastroenterology   Meds ordered this encounter  Medications   amLODipine (NORVASC) 5 MG tablet    Sig: Take 1 tablet (5 mg total) by mouth daily.    Dispense:  90 tablet    Refill:  0   metoprolol succinate (TOPROL-XL) 50 MG 24 hr tablet    Sig: Take 1 tablet (50 mg total) by mouth daily.    Dispense:  90 tablet    Refill:  1   tiZANidine (ZANAFLEX) 4 MG tablet    Sig: Take 1 tablet (4 mg total) by mouth every 6 (six) hours as needed for muscle spasms.    Dispense:  30 tablet    Refill:  5   valsartan (DIOVAN) 320 MG tablet    Sig: Take 1 tablet (320 mg total) by mouth daily.    Dispense:  90 tablet    Refill:  1   rosuvastatin (CRESTOR) 20 MG tablet    Sig: Take 1 tablet (20 mg total) by mouth daily.    Dispense:  90 tablet    Refill:  3   fluticasone (FLONASE) 50 MCG/ACT nasal spray    Sig: Place 2 sprays into both nostrils daily.    Dispense:  16 g    Refill:  11   albuterol (VENTOLIN HFA) 108 (90 Base) MCG/ACT inhaler    Sig: Inhale 1-2 puffs into the lungs every 6 (six) hours as needed for wheezing or shortness of breath.    Dispense:  8 g    Refill:  11    Referral Orders         Ambulatory referral to Gastroenterology      Note is dictated utilizing voice recognition software. Although note has been proof read prior to signing,  occasional typographical errors still can be missed. If any questions arise, please do not hesitate to call for verification.   electronically signed by:  Felix Pacini, DO  [facility] [place]

## 2023-07-17 NOTE — Patient Instructions (Addendum)
 Return in about 24 weeks (around 01/01/2024) for Routine chronic condition follow-up.  4 weeks for BP recheck with provider.   [facility] Phone: [phone]      Great to see you today.  I have refilled the medication(s) we provide.   If labs were collected or images ordered, we will inform you of  results once we have received them and reviewed. We will contact you either by echart message, or telephone call.  Please give ample time to the testing facility, and our office to run,  receive and review results. Please do not call inquiring of results, even if you can see them in your chart. We will contact you as soon as we are able. If it has been over 1 week since the test was completed, and you have not yet heard from [place], then please call us.    - echart message- for normal results that have been seen by the patient already.   - telephone call: abnormal results or if patient has not viewed results in their echart.  If a referral to a specialist was entered for you, please call us in 2 weeks if you have not heard from the specialist office to schedule.

## 2023-07-18 ENCOUNTER — Encounter: Payer: Self-pay | Admitting: Family Medicine

## 2023-07-18 ENCOUNTER — Ambulatory Visit: Payer: Self-pay

## 2023-07-18 ENCOUNTER — Other Ambulatory Visit: Payer: Self-pay

## 2023-07-18 DIAGNOSIS — M25561 Pain in right knee: Secondary | ICD-10-CM

## 2023-07-18 DIAGNOSIS — R262 Difficulty in walking, not elsewhere classified: Secondary | ICD-10-CM

## 2023-07-18 DIAGNOSIS — R29898 Other symptoms and signs involving the musculoskeletal system: Secondary | ICD-10-CM

## 2023-07-18 NOTE — Therapy (Addendum)
 OUTPATIENT PHYSICAL THERAPY LOWER EXTREMITY TREATMENT   Patient Name: Julie Osborne MRN: [id number] DOB:01/22/1951, 73 y.o., female Today's Date: 07/18/2023  END OF SESSION:  PT End of Session - 07/18/23 0855     Visit Number 2    Date for PT Re-Evaluation 09/05/23    Progress Note Due on Visit 10    PT Start Time 0856    PT Stop Time 0936    PT Time Calculation (min) 40 min    Activity Tolerance Patient tolerated treatment well    Behavior During Therapy [facility] for tasks assessed/performed              Past Medical History:  Diagnosis Date   Apnea    Closed fracture of neck of left radius 10/14/2017   Concussion    Degenerative arthritis    generalized/back/LEFT foot   Diabetic neuropathy (HCC) 2017   Diverticulosis 06/19/2013   sigmoid colon on colonoscopy   E-coli UTI 04/17/2023   Hyperlipidemia 2000   on meds   Hypertension, benign 1996   on meds   Lumbar radiculopathy    Severe L4/L5 radiculopathy by old records.    OSA (obstructive sleep apnea)    Pes planus 12/17/2011   Posterior tibial tendon dysfunction (PTTD) of left lower extremity 04/10/2015   post tibial tendon dysfunction stage 5 (PTTD)   Pyelonephritis 04/17/2023   Situational anxiety 03/30/2020   Tenosynovitis of ankle 2017   post tibial tendon dysfunction stage 5 (PTTD)   Uncontrolled diabetes mellitus 2005   on meds   Past Surgical History:  Procedure Laterality Date   CORONARY ANGIOPLASTY WITH STENT PLACEMENT  10/17/2018   DES x2/LAD   EXPLORATORY LAPAROTOMY  1976   endometriosis   MINOR HEMORRHOIDECTOMY  2006   OTHER SURGICAL HISTORY Right 1975   venous ligation   Patient Active Problem List   Diagnosis Date Noted   Osteopenia 12/28/2022   Hyponatremia 08/16/2020   CAD S/P percutaneous coronary angioplasty 12/08/2018   Abnormal cardiovascular stress test 10/16/2018   Crescendo angina (HCC) 10/16/2018   Allergic rhinitis 04/22/2018   Lumbar back pain with radiculopathy affecting  lower extremity 04/17/2018   Type 2 diabetes mellitus with hyperlipidemia (HCC)    Hypertension, benign    Hyperlipidemia    Spinal stenosis, lumbar region, without neurogenic claudication 02/06/2007    PCP: Catherine Fuller, DO  REFERRING PROVIDER: Harden Lame, MD  REFERRING DIAG:longitudinal fracture R patella due to fall  THERAPY DIAG:  Acute pain of right knee  Difficulty in walking, not elsewhere classified  Right leg weakness  Rationale for Evaluation and Treatment: Rehabilitation  ONSET DATE: 05/28/23  SUBJECTIVE:   SUBJECTIVE STATEMENT:07/18/23:  I tired the exercises that you gave me and added some others from online which seem to be helping.  I obtained a taller rollator to use which has made my back feel much better. Rknee pain is better Eval:I had UTI around December, was hospitalized in [place]  and they gave me this rollator, then I feel in lobby of my grandson[employer] on my knee, fractured it.  I have some pain in my knee, notice it more when I walk with cane or crutch, only single hand support, feel like I can manage my endurance and my pain better with the rollator but the rollator is too short I think and my back hurts.  PERTINENT HISTORY: [place] about 5 weeks ago on R knee, longitudinal R patellar fx PAIN:  Are you having pain? Yes: NPRS scale: 0 to7  Pain location: R medial knee along jt line Pain description: aches, Aggravating factors: walking too far Relieving factors: resting  PRECAUTIONS: Fall  RED FLAGS: None   WEIGHT BEARING RESTRICTIONS: No  FALLS:  Has patient fallen in last 6 months? Yes. 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   NEXT MD VISIT: unclear  OBJECTIVE:  Note: Objective measures were completed at Evaluation unless otherwise noted.  DIAGNOSTIC FINDINGS: see ortho notes in EPIc  PATIENT SURVEYS:  Tegner Lysholm Knee Score: 34 / 100 = 34.0 %  COGNITION: Overall cognitive status: Within functional limits for tasks assessed     SENSATION: WFL  EDEMA:  NONE noted today but pt reports edema with prolonged walking    POSTURE: note collapsed, valgum L foot, very lean B LE's, tends to ER B feet , no antalgia noted  PALPATION: Tender along medial border R patella and medial jt line  LOWER EXTREMITY ROM:R knee ROM wfl, equal to L   LOWER EXTREMITY MMT: R long arc quad with normal MMT Pain and weakness, fasciculations noted with R SLR, lag of 12 degrees Plantarflexor weakness B 3+ /5   5 times sit to stand: 38s 6 minute walk test: unable to complete, due to pain R knee with prolonged walker greater than 150'  GAIT: Distance walked: 19' in clinic Assistive device utilized: Walker - 4 wheeled Level of assistance: Modified independence Comments: rollator too short, by 1-2 .  Used rollator in our clinic at appropriate height and pt more comfortable.  Unable to raise rollator handles any more                                                                                                                                TREATMENT DATE:  07/18/23:  Neuromuscular re education: to engage stabilizers through hips and ankles, LE's due to her high fall risk:  Staggered stance, L foot forward on 2 step small forward lunges, withR heel raises to engage plantarflexors for stability, she needed frequent UE support, tended to sway a lot Sit to stand from elevated table with black t band around thighs for proximal stability Instructed with gait to emphasize heel/toe gait pattern.  Therapeutic activities:  instructed in  the following to further engage her R quadriceps as well as improve her core stability:  Supine hook lying adductor ball squeezes with bridging 12 reps, difficulty noted with maintaining hips level SLR cues to avoid lag R knee, tends to lag after 5 reps R side lying for L hip adduction 10 reps  Seated with hands on knees for B heel raises for soleus 12x Seated for ball squeezes between ankles to engage medial ankle stability , with heel raises 10x Seated ankle plantarflexion with t band resistance 10 x each    07/11/23 Eval, ins in SLR, using theraband to assist  Inst in post wall leans for quads strength    PATIENT EDUCATION:  Education details: POC, goals Person educated: Patient Education method: Explanation, Demonstration, Tactile cues, and Verbal cues Education comprehension: verbalized understanding, returned demonstration, and verbal cues required  HOME EXERCISE PROGRAM: Access Code: [email or web address] URL: [email or web address] Date: 07/18/2023 Prepared by: Greig Meghan Warshawsky  Exercises - Supine Hip Adduction Isometric with Ball  - 1 x daily - 7 x weekly - 3 sets - 10 reps - Seated Heel Raise  - 1 x daily - 7 x weekly - 3 sets - 10 reps - Long Sitting Ankle Plantar Flexion with Resistance  - 1 x daily - 7 x weekly - 3 sets - 10 reps - Sit to Stand - Increased Speed  - 1 x daily - 7 x weekly - 3 sets - 10 reps Post leans with heel rocks to cue B quads Supine SLR  ASSESSMENT:  CLINICAL IMPRESSION: Patient is a 73 y.o. female who participated today with skilled  physical therapy due to injuries R knee due to a fall in February, with resultant pain and longitudinal fx R patella.  Today was spent advancing the complexity of her exercises, with emphasis on trunk and hip stability, also we utilized several new ankle ex as pt wishes to improve her strength and circulation B ankles due to her neuropathy.  She tolerated all activities well. Still quite unbalanced.  OBJECTIVE  IMPAIRMENTS: decreased activity tolerance, decreased balance, decreased endurance, difficulty walking, decreased strength, impaired perceived functional ability, and pain.   ACTIVITY LIMITATIONS: carrying, lifting, standing, transfers, and locomotion level  PARTICIPATION LIMITATIONS: meal prep, laundry, shopping, community activity, and yard work  PERSONAL FACTORS: Past/current experiences, Time since onset of injury/illness/exacerbation, and 1-2 comorbidities: peripheral neuropathy, frequent falls, are also affecting patient's functional outcome.   REHAB POTENTIAL: Good  CLINICAL DECISION MAKING: Evolving/moderate complexity  EVALUATION COMPLEXITY: Moderate   GOALS: Goals reviewed with patient? No  SHORT TERM GOALS: Target date: 2 weeks 07/25/23 I HEP Baseline: Goal status: INITIAL   LONG TERM GOALS: Target date: 09/05/23 8 weeks  Full strength R quads, SLR consistent greater than 15 reps without lag Baseline: 12 degree lag Goal status: INITIAL  2.  5 x sit to stand 20 sec or less Baseline: 38 sec  Goal status: INITIAL  3.  Gait 6 min walk test 800' or greater with st cane , I  Baseline: 150' currently with rollator Goal status: INITIAL  4.  Complete FGA and set goals when appropriate Baseline:  Goal status: INITIAL    PLAN:  PT FREQUENCY: 2x/week  PT DURATION: 8 weeks  PLANNED INTERVENTIONS: 97110-Therapeutic exercises, 97530- Therapeutic activity, 97112- Neuromuscular re-education, 97535- Self Care, and 02859- Manual therapy  PLAN FOR NEXT SESSION: continue therex, progress with additional balance and stability challenges, progress R quads strengthening as pain continues to improve R knee.  Julie Osborne, PT, DPT, OCS 07/18/2023, 12:17 PM   11/19/23:  PT has not returned for further appts.  DC at this time.

## 2023-07-23 NOTE — Telephone Encounter (Signed)
 Sent as FYI.

## 2023-07-24 ENCOUNTER — Ambulatory Visit: Admitting: Physical Therapy

## 2023-08-06 ENCOUNTER — Ambulatory Visit

## 2023-08-08 ENCOUNTER — Ambulatory Visit

## 2023-08-09 ENCOUNTER — Ambulatory Visit: Admitting: Family Medicine

## 2023-09-03 ENCOUNTER — Ambulatory Visit (HOSPITAL_BASED_OUTPATIENT_CLINIC_OR_DEPARTMENT_OTHER): Payer: Medicare Other

## 2023-09-04 ENCOUNTER — Ambulatory Visit (HOSPITAL_BASED_OUTPATIENT_CLINIC_OR_DEPARTMENT_OTHER)
Admission: RE | Admit: 2023-09-04 | Discharge: 2023-09-04 | Disposition: A | Discharge status: Home / Self Care | Source: Ambulatory Visit | Attending: Urology | Admitting: Urology

## 2023-09-04 DIAGNOSIS — N10 Acute tubulo-interstitial nephritis: Secondary | ICD-10-CM | POA: Insufficient documentation

## 2023-09-05 ENCOUNTER — Encounter: Payer: Self-pay | Admitting: Gastroenterology

## 2023-09-11 ENCOUNTER — Encounter: Payer: Self-pay | Admitting: Urology

## 2023-09-11 ENCOUNTER — Ambulatory Visit (INDEPENDENT_AMBULATORY_CARE_PROVIDER_SITE_OTHER): Admitting: Urology

## 2023-09-11 VITALS — BP 160/67 | HR 71

## 2023-09-11 DIAGNOSIS — Z87440 Personal history of urinary (tract) infections: Secondary | ICD-10-CM | POA: Diagnosis not present

## 2023-09-11 DIAGNOSIS — N390 Urinary tract infection, site not specified: Secondary | ICD-10-CM

## 2023-09-11 DIAGNOSIS — Z09 Encounter for follow-up examination after completed treatment for conditions other than malignant neoplasm: Secondary | ICD-10-CM | POA: Diagnosis not present

## 2023-09-11 LAB — URINALYSIS, ROUTINE W REFLEX MICROSCOPIC
Bilirubin, UA: NEGATIVE
Glucose, UA: NEGATIVE
Ketones, UA: NEGATIVE
Leukocytes,UA: NEGATIVE
Nitrite, UA: NEGATIVE
Protein,UA: NEGATIVE
RBC, UA: NEGATIVE
Specific Gravity, UA: 1.01 (ref 1.005–1.030)
Urobilinogen, Ur: 0.2 mg/dL (ref 0.2–1.0)
pH, UA: 6.5 (ref 5.0–7.5)

## 2023-09-11 MED ORDER — SULFAMETHOXAZOLE-TRIMETHOPRIM 400-80 MG PO TABS: 1.0000 | ORAL_TABLET | Freq: Every evening | ORAL | 0 refills | Status: DC

## 2023-09-11 NOTE — Progress Notes (Signed)
 Assessment: 1. Recurrent UTI     Plan: I personally reviewed the patient's chart including provider notes, lab and imaging results. I reviewed the recent renal ultrasound.  No obvious abnormality seen.  Await final radiology report. Continue methods to reduce the risk of UTIs including increase fluid intake, timed and double voiding, daily cranberry supplement, daily probiotic, and vaginal hormone cream. She would like to continue with the low-dose daily antibiotic at this time.  Refill sent. Return to office in approximately 2 months.  She will discontinue the daily antibiotic prior to her next visit.  Chief Complaint: Chief Complaint  Patient presents with   Recurrent UTI    HPI: Julie Osborne is a 73 y.o. female who presents for continued evaluation of recurrent UTI. She was previously evaluated by Dr. Del Favia in February 2025 with history as noted below:  Briefly the patient has a history of recurrent uncomplicated UTIs over the last several years but also a recent episode of pyelonephritis/urosepsis in December 2025. When I saw her in February she was started on UTI prevention measures including vaginal estrogen cream, urinary probiotic and was also started on low-dose Bactrim  suppression with plans to keep her on that for the next several months.  PCP also had ordered a follow-up CT scan which continued to show some perfusion defects within the right kidney consistent with her recent pyelonephritis.  Patient presents here today for follow-up.  She was recently seen in the emergency room following a fall and injured her knee. She is doing very well from a urologic standpoint.  She has had no recurrent UTI symptoms.  I had her come in last week for urinalysis which was entirely normal but did send a resolve MDX culture which showed low colony counts of Enterococcus.  She presents today for follow-up.  She continues on daily low-dose Bactrim  for UTI prevention.  She is also using daily  cranberry supplement and a daily probiotic as well as vaginal hormone cream.  No current UTI symptoms.  No dysuria or gross hematuria.  Portions of the above documentation were copied from a prior visit for review purposes only.  Allergies: Allergies  Allergen Reactions   Statins     Lipitor,simvatatin, pravastatin.welchol cause myalgia    PMH: Past Medical History:  Diagnosis Date   Apnea    Closed fracture of neck of left radius 10/14/2017   Concussion    Degenerative arthritis    generalized/back/LEFT foot   Diabetic neuropathy (HCC) 2017   Diverticulosis 06/19/2013   sigmoid colon on colonoscopy   E-coli UTI 04/17/2023   Hyperlipidemia 2000   on meds   Hypertension, benign 1996   on meds   Lumbar radiculopathy    Severe L4/L5 radiculopathy by old records.    OSA (obstructive sleep apnea)    Pes planus 12/17/2011   Posterior tibial tendon dysfunction (PTTD) of left lower extremity 04/10/2015   post tibial tendon dysfunction stage 5 (PTTD)   Pyelonephritis 04/17/2023   Situational anxiety 03/30/2020   Tenosynovitis of ankle 2017   post tibial tendon dysfunction stage 5 (PTTD)   Uncontrolled diabetes mellitus 2005   on meds    PSH: Past Surgical History:  Procedure Laterality Date   CORONARY ANGIOPLASTY WITH STENT PLACEMENT  10/17/2018   DES x2/LAD   EXPLORATORY LAPAROTOMY  1976   endometriosis   MINOR HEMORRHOIDECTOMY  2006   OTHER SURGICAL HISTORY Right 1975   venous ligation    SH: Social History   Tobacco Use  Smoking status: Former    Current packs/day: 0.00    Types: Cigarettes    Quit date: 04/10/2011    Years since quitting: 12.4   Smokeless tobacco: Never  Vaping Use   Vaping status: Never Used  Substance Use Topics   Alcohol use: No   Drug use: No    ROS: Constitutional:  Negative for fever, chills, weight loss CV: Negative for chest pain, previous MI, hypertension Respiratory:  Negative for shortness of breath, wheezing, sleep apnea,  frequent cough GI:  Negative for nausea, vomiting, bloody stool, GERD  PE: BP (!) 160/67   Pulse 71  GENERAL APPEARANCE:  Well appearing, well developed, well nourished, NAD HEENT:  Atraumatic, normocephalic, oropharynx clear NECK:  Supple without lymphadenopathy or thyromegaly ABDOMEN:  Soft, non-tender, no masses EXTREMITIES:  Moves all extremities well, without clubbing, cyanosis, or edema NEUROLOGIC:  Alert and oriented x 3, normal gait, CN II-XII grossly intact MENTAL STATUS:  appropriate BACK:  Non-tender to palpation, No CVAT SKIN:  Warm, dry, and intact   Results: U/A: Negative

## 2023-09-12 ENCOUNTER — Ambulatory Visit: Payer: Medicare Other | Admitting: Urology

## 2023-09-27 ENCOUNTER — Ambulatory Visit: Admitting: Urology

## 2023-10-12 ENCOUNTER — Other Ambulatory Visit: Payer: Self-pay | Admitting: Family Medicine

## 2023-10-14 ENCOUNTER — Ambulatory Visit (AMBULATORY_SURGERY_CENTER)

## 2023-10-14 VITALS — Ht 69.5 in | Wt 220.0 lb

## 2023-10-14 DIAGNOSIS — Z860100 Personal history of colon polyps, unspecified: Secondary | ICD-10-CM

## 2023-10-14 MED ORDER — NA SULFATE-K SULFATE-MG SULF 17.5-3.13-1.6 GM/177ML PO SOLN: 1.0000 | Freq: Once | ORAL | 0 refills | Status: AC

## 2023-10-14 NOTE — Progress Notes (Signed)
 No egg or soy allergy known to patient  No issues known to pt with past sedation with any surgeries or procedures Patient denies ever being told they had issues or difficulty with intubation  No FH of Malignant Hyperthermia Pt is not on diet pills; Taking Mounjaro and hold instructions provided Pt is not on  home 02  Pt is not on blood thinners  Pt has issues with constipation; 2 day prep ordered  No A fib or A flutter Have any cardiac testing pending--No Pt can ambulate  Pt denies use of chewing tobacco Discussed diabetic I weight loss medication holds Discussed NSAID holds Checked BMI Pt instructed to use Singlecare.com or GoodRx for a price reduction on prep  Patient's chart reviewed by Norleen Schillings CNRA prior to previsit and patient appropriate for the [facility].  Pre visit completed and red dot placed by patient's name on their procedure day (on provider's schedule).

## 2023-10-20 ENCOUNTER — Other Ambulatory Visit: Payer: Self-pay | Admitting: Family Medicine

## 2023-10-22 ENCOUNTER — Other Ambulatory Visit: Payer: Self-pay | Admitting: Urology

## 2023-10-22 ENCOUNTER — Encounter: Admitting: Gastroenterology

## 2023-10-22 DIAGNOSIS — N390 Urinary tract infection, site not specified: Secondary | ICD-10-CM

## 2023-11-05 ENCOUNTER — Ambulatory Visit: Admitting: Gastroenterology

## 2023-11-05 ENCOUNTER — Encounter: Payer: Self-pay | Admitting: Gastroenterology

## 2023-11-05 ENCOUNTER — Encounter: Payer: Self-pay | Admitting: Urology

## 2023-11-05 VITALS — BP 145/77 | HR 52 | Temp 96.8°F | Resp 12 | Ht 69.0 in | Wt 220.0 lb

## 2023-11-05 DIAGNOSIS — K562 Volvulus: Secondary | ICD-10-CM | POA: Diagnosis not present

## 2023-11-05 DIAGNOSIS — D124 Benign neoplasm of descending colon: Secondary | ICD-10-CM

## 2023-11-05 DIAGNOSIS — Z860101 Personal history of adenomatous and serrated colon polyps: Secondary | ICD-10-CM | POA: Diagnosis not present

## 2023-11-05 DIAGNOSIS — Z860100 Personal history of colon polyps, unspecified: Secondary | ICD-10-CM

## 2023-11-05 DIAGNOSIS — Z1211 Encounter for screening for malignant neoplasm of colon: Secondary | ICD-10-CM | POA: Diagnosis not present

## 2023-11-05 MED ORDER — SODIUM CHLORIDE 0.9 % IV SOLN: 500.0000 mL | Freq: Once | INTRAVENOUS | Status: DC

## 2023-11-05 NOTE — Op Note (Signed)
 [facility] Patient Name: Julie Osborne Procedure Date: 11/05/2023 8:45 AM MRN: [id number] Endoscopist: Sandor Flatter , MD, [id number] Age: 73 Referring MD:  Date of Birth: Jan 28, 1951 Gender: Female Account #: [id number] Procedure:                Colonoscopy Indications:              Surveillance: History of adenomatous polyps,                            inadequate prep on last exam (<28yr)                           Last colonoscopy was 06/2020 and notable for 6                            subcentimeter polyps (3 adenomas). Due to fair                            prep, was recommended for short interval repeat.                           Completed an extended 2-day prep for procedure                            today. Medicines:                Monitored Anesthesia Care Procedure:                Pre-Anesthesia Assessment:                           - Prior to the procedure, a History and Physical                            was performed, and patient medications and                            allergies were reviewed. The patient's tolerance of                            previous anesthesia was also reviewed. The risks                            and benefits of the procedure and the sedation                            options and risks were discussed with the patient.                            All questions were answered, and informed consent                            was obtained. Prior Anticoagulants: The patient has  taken no anticoagulant or antiplatelet agents. ASA                            Grade Assessment: II - A patient with mild systemic                            disease. After reviewing the risks and benefits,                            the patient was deemed in satisfactory condition to                            undergo the procedure.                           After obtaining informed consent, the colonoscope                            was  passed under direct vision. Throughout the                            procedure, the patient's blood pressure, pulse, and                            oxygen saturations were monitored continuously. The                            Olympus Scope [id number] was introduced through the                            anus and advanced to the the cecum, identified by                            appendiceal orifice and ileocecal valve. The                            colonoscopy was technically difficult and complex                            due to inadequate bowel prep and significant                            looping. The patient tolerated the procedure well.                            The quality of the bowel preparation was fair. The                            ileocecal valve, appendiceal orifice, and rectum                            were photographed. Scope In: 9:02:45 AM Scope Out: 9:25:06 AM Scope Withdrawal Time: 0 hours 9 minutes 30 seconds  Total Procedure Duration: 0  hours 22 minutes 21 seconds  Findings:                 The perianal and digital rectal examinations were                            normal.                           A 3 mm polyp was found in the descending colon. The                            polyp was sessile. The polyp was removed with a                            cold snare. Resection and retrieval were complete.                            Estimated blood loss was minimal.                           The sigmoid colon and ascending colon revealed                            significantly excessive looping. An abdominal                            binder was placed pre-operatively. Advancing the                            scope required straightening and shortening the                            scope to obtain bowel loop reduction and applying                            abdominal pressure.                           A moderate amount of stool was found in the entire                             colon, interfering with visualization. Lavage of                            the area was performed using copious amounts of                            sterile water, resulting in incomplete clearance                            with fair visualization. Complications:            No immediate complications. Estimated Blood Loss:     Estimated blood loss was minimal. Impression:               -  Preparation of the colon was fair.                           - One 3 mm polyp in the descending colon, removed                            with a cold snare. Resected and retrieved.                           - There was significant looping of the colon.                           - Stool in the entire examined colon. Recommendation:           - Patient has a contact number available for                            emergencies. The signs and symptoms of potential                            delayed complications were discussed with the                            patient. Return to normal activities tomorrow.                            Written discharge instructions were provided to the                            patient.                           - Resume previous diet.                           - Continue present medications.                           - Await pathology results.                           - Repeat colonoscopy for surveillance based on                            pathology results.                           - Return to GI office at appointment to be                            scheduled. Will plan to discuss ongoing management                            of constipation and possible alternative bowel prep  plan for repeat colonoscopy. Sandor Flatter, MD 11/05/2023 9:34:55 AM

## 2023-11-05 NOTE — Progress Notes (Signed)
 GASTROENTEROLOGY PROCEDURE H&P NOTE   Primary Care Physician: Catherine Charlies LABOR, DO    Reason for Procedure:  Colon polyp surveillance  Plan:    Colonoscopy  Patient is appropriate for endoscopic procedure(s) in the ambulatory ([facility]) setting.  The nature of the procedure, as well as the risks, benefits, and alternatives were carefully and thoroughly reviewed with the patient. Ample time for discussion and questions allowed. The patient understood, was satisfied, and agreed to proceed.     HPI: Julie Osborne is a 73 y.o. female who presents for colonoscopy for ongoing colon polyp surveillance and colon cancer screening.  No active GI symptoms.  No known family history of colon cancer or related malignancy.  Patient is otherwise without complaints or active issues today.  Last colonoscopy was 06/2020 and notable for 6 subcentimeter polyps (3 adenomas). Due to fair prep, was recommended for short interval repeat.  Past Medical History:  Diagnosis Date   Apnea    Closed fracture of neck of left radius 10/14/2017   Concussion    Degenerative arthritis    generalized/back/LEFT foot   Diabetic neuropathy (HCC) 2017   Diverticulosis 06/19/2013   sigmoid colon on colonoscopy   E-coli UTI 04/17/2023   Hyperlipidemia 2000   on meds   Hypertension, benign 1996   on meds   Lumbar radiculopathy    Severe L4/L5 radiculopathy by old records.    OSA (obstructive sleep apnea)    Pes planus 12/17/2011   Posterior tibial tendon dysfunction (PTTD) of left lower extremity 04/10/2015   post tibial tendon dysfunction stage 5 (PTTD)   Pyelonephritis 04/17/2023   Situational anxiety 03/30/2020   Tenosynovitis of ankle 2017   post tibial tendon dysfunction stage 5 (PTTD)   Uncontrolled diabetes mellitus 2005   on meds    Past Surgical History:  Procedure Laterality Date   CORONARY ANGIOPLASTY WITH STENT PLACEMENT  10/17/2018   DES x2/LAD   EXPLORATORY LAPAROTOMY  1976   endometriosis    GASTRIC BYPASS  2024   MINOR HEMORRHOIDECTOMY  2006   OTHER SURGICAL HISTORY Right 1975   venous ligation    Prior to Admission medications   Medication Sig Start Date End Date Taking? Authorizing Provider  amLODipine  (NORVASC ) 5 MG tablet TAKE 1 TABLET (5 MG TOTAL) BY MOUTH DAILY. 10/14/23  Yes Kuneff, Renee A, DO  aspirin EC 81 MG tablet Take 81 mg by mouth daily.   Yes [provider]  fluticasone  (FLONASE ) 50 MCG/ACT nasal spray Place 2 sprays into both nostrils daily. 07/17/23  Yes Kuneff, Renee A, DO  Insulin  Aspart FlexPen (NOVOLOG) 100 UNIT/ML Inject 4 Units into the skin 3 (three) times daily. 4-12 units per sliding scale 09/10/23  Yes [provider]  insulin  degludec (TRESIBA) 200 UNIT/ML FlexTouch Pen Inject 30 Units into the skin at bedtime.   Yes [provider]  metoprolol  succinate (TOPROL -XL) 50 MG 24 hr tablet Take 1 tablet (50 mg total) by mouth daily. 07/17/23  Yes Kuneff, Renee A, DO  Multiple Vitamin (MULTIVITAMIN) capsule Take 1 capsule by mouth daily.    Yes [provider]  omeprazole (PRILOSEC) 20 MG capsule Take 20 mg by mouth daily.   Yes [provider]  rosuvastatin  (CRESTOR ) 20 MG tablet Take 1 tablet (20 mg total) by mouth daily. 07/17/23  Yes Kuneff, Renee A, DO  sulfamethoxazole -trimethoprim  (BACTRIM ) 400-80 MG tablet Take 1 tablet by mouth at bedtime. 09/11/23  Yes Stoneking, Adine PARAS., MD  tiZANidine  (ZANAFLEX ) 4 MG  tablet Take 1 tablet (4 mg total) by mouth every 6 (six) hours as needed for muscle spasms. 07/17/23  Yes Kuneff, Renee A, DO  valsartan  (DIOVAN ) 320 MG tablet Take 1 tablet (320 mg total) by mouth daily. 07/17/23  Yes Kuneff, Renee A, DO  albuterol  (VENTOLIN  HFA) 108 (90 Base) MCG/ACT inhaler Inhale 1-2 puffs into the lungs every 6 (six) hours as needed for wheezing or shortness of breath. 07/17/23   Kuneff, Renee A, DO  Docusate Sodium 150 MG/15ML syrup Take 50 mg by mouth 2 (two) times daily.    [provider]  estradiol  (ESTRACE ) 0.1 MG/GM vaginal cream Apply pea sized amount on fingertip 3x weekly 05/15/23   Shona Layman BROCKS, MD  MOUNJARO 2.5 MG/0.5ML Pen Inject 2.5 mg into the skin once a week. 09/05/23   [provider]  nitroGLYCERIN (NITROSTAT) 0.4 MG SL tablet Place under the tongue.  10/18/18   [provider]  ondansetron  (ZOFRAN ) 8 MG tablet Take 8 mg by mouth every 8 (eight) hours as needed for nausea or vomiting.    [provider]  senna-docusate (SENOKOT-S) 8.6-50 MG tablet Take 1 tablet by mouth 2 (two) times daily.    [provider]  tirzepatide CLOYDE) 5 MG/0.5ML Pen Inject 5 mg into the skin once a week. Patient not taking: Reported on 10/14/2023 09/26/23   [provider]  TOUJEO  MAX SOLOSTAR 300 UNIT/ML Solostar Pen Inject into the skin. 45 unit sub q PM Patient not taking: Reported on 10/14/2023 04/03/22   [provider]  Vitamin D , Ergocalciferol , (DRISDOL) 1.25 MG (50000 UNIT) CAPS capsule Take 50,000 Units by mouth once a week. 06/29/23   [provider]    Current Outpatient Medications  Medication Sig Dispense Refill   amLODipine  (NORVASC ) 5 MG tablet TAKE 1 TABLET (5 MG TOTAL) BY MOUTH DAILY. 90 tablet 0   aspirin EC 81 MG tablet Take 81 mg by mouth daily.     fluticasone  (FLONASE ) 50 MCG/ACT nasal spray Place 2 sprays into both nostrils daily. 16 g 11   Insulin  Aspart FlexPen (NOVOLOG) 100 UNIT/ML Inject 4 Units into the skin 3 (three) times daily. 4-12 units per sliding scale     insulin  degludec (TRESIBA) 200 UNIT/ML FlexTouch Pen Inject 30 Units into the skin at bedtime.     metoprolol  succinate (TOPROL -XL) 50 MG 24 hr tablet Take 1 tablet (50 mg total) by mouth daily. 90 tablet 1   Multiple Vitamin (MULTIVITAMIN) capsule Take 1 capsule by mouth daily.      omeprazole (PRILOSEC) 20 MG capsule Take 20 mg by mouth daily.     rosuvastatin  (CRESTOR ) 20 MG tablet Take 1 tablet (20 mg total) by mouth  daily. 90 tablet 3   sulfamethoxazole -trimethoprim  (BACTRIM ) 400-80 MG tablet Take 1 tablet by mouth at bedtime. 30 tablet 0   tiZANidine  (ZANAFLEX ) 4 MG tablet Take 1 tablet (4 mg total) by mouth every 6 (six) hours as needed for muscle spasms. 30 tablet 5   valsartan  (DIOVAN ) 320 MG tablet Take 1 tablet (320 mg total) by mouth daily. 90 tablet 1   albuterol  (VENTOLIN  HFA) 108 (90 Base) MCG/ACT inhaler Inhale 1-2 puffs into the lungs every 6 (six) hours as needed for wheezing or shortness of breath. 8 g 11   Docusate Sodium 150 MG/15ML syrup Take 50 mg by mouth 2 (two) times daily.     estradiol  (ESTRACE ) 0.1 MG/GM vaginal cream Apply pea sized amount on fingertip 3x weekly 42.5  g 12   MOUNJARO 2.5 MG/0.5ML Pen Inject 2.5 mg into the skin once a week.     nitroGLYCERIN (NITROSTAT) 0.4 MG SL tablet Place under the tongue.      ondansetron  (ZOFRAN ) 8 MG tablet Take 8 mg by mouth every 8 (eight) hours as needed for nausea or vomiting.     senna-docusate (SENOKOT-S) 8.6-50 MG tablet Take 1 tablet by mouth 2 (two) times daily.     tirzepatide (MOUNJARO) 5 MG/0.5ML Pen Inject 5 mg into the skin once a week. (Patient not taking: Reported on 10/14/2023)     TOUJEO  MAX SOLOSTAR 300 UNIT/ML Solostar Pen Inject into the skin. 45 unit sub q PM (Patient not taking: Reported on 10/14/2023)     Vitamin D , Ergocalciferol , (DRISDOL) 1.25 MG (50000 UNIT) CAPS capsule Take 50,000 Units by mouth once a week.     Current Facility-Administered Medications  Medication Dose Route Frequency Provider Last Rate Last Admin   0.9 %  sodium chloride  infusion  500 mL Intravenous Once Jamarii Banks V, DO        Allergies as of 11/05/2023 - Review Complete 11/05/2023  Allergen Reaction Noted   Statins Other (See Comments) 01/13/2016    Family History  Problem Relation Age of Onset   COPD Mother    Diabetes Mellitus II Mother    Heart failure Mother    Osteoarthritis Father    Cancer Father        tumor found behind  xyphoid process   Diabetes Sister    Diabetes Brother    Dementia Maternal Grandmother    Heart disease Maternal Grandmother    Heart disease Maternal Grandfather    Stroke Maternal Grandfather    Heart failure Maternal Aunt    Lung cancer Paternal Grandfather    Lung cancer Paternal Aunt    Colon polyps Neg Hx    Colon cancer Neg Hx    Esophageal cancer Neg Hx    Stomach cancer Neg Hx    Rectal cancer Neg Hx     Social History   Socioeconomic History   Marital status: Married    Spouse name: Tim   Number of children: 2   Years of education: 16   Highest education level: Bachelor's degree (e.g., BA, AB, BS)  Occupational History   Occupation: Runner, broadcasting/film/video   Occupation: retired Engineer, civil (consulting)  Tobacco Use   Smoking status: Former    Current packs/day: 0.00    Types: Cigarettes    Quit date: 04/10/2011    Years since quitting: 12.5   Smokeless tobacco: Never  Vaping Use   Vaping status: Never Used  Substance and Sexual Activity   Alcohol use: No   Drug use: No   Sexual activity: Yes    Partners: Male    Birth control/protection: None    Comment: Married  Other Topics Concern   Not on file  Social History Narrative   Married to Biron, 2 children.    BS, RN retired now Agricultural consultant.    Drinks caffeine, uses herbal remedies. Daily vitmain use.   Wears her seatbelt, smoke detector in the home.    exercises routinely.    She feels safe in her relationships.    Social Drivers of Corporate investment banker Strain: Low Risk  (02/04/2023)   Overall Financial Resource Strain (CARDIA)    Difficulty of Paying Living Expenses: Not very hard  Food Insecurity: No Food Insecurity (03/28/2023)   Received from [facility]   Hunger  Vital Sign    Within the past 12 months, you worried that your food would run out before you got the money to buy more.: Never true    Within the past 12 months, the food you bought just didn't last and you didn't have money to get more.: Never true   Transportation Needs: No Transportation Needs (03/28/2023)   Received from [facility] - Transportation    Lack of Transportation (Medical): No    Lack of Transportation (Non-Medical): No  Physical Activity: Insufficiently Active (02/04/2023)   Exercise Vital Sign    Days of Exercise per Week: 3 days    Minutes of Exercise per Session: 10 min  Stress: No Stress Concern Present (02/04/2023)   [employer] of Occupational Health - Occupational Stress Questionnaire    Feeling of Stress : Only a little  Social Connections: Socially Integrated (02/04/2023)   Social Connection and Isolation Panel    Frequency of Communication with Friends and Family: More than three times a week    Frequency of Social Gatherings with Friends and Family: Twice a week    Attends Religious Services: More than 4 times per year    Active Member of [employer] or Organizations: Yes    Attends Engineer, structural: More than 4 times per year    Marital Status: Married  Catering manager Violence: Patient Unable To Answer (03/28/2023)   Received from [facility]   Humiliation, Afraid, Rape, and Kick questionnaire    Within the last year, have you been afraid of your partner or ex-partner?: Patient unable to answer    Within the last year, have you been humiliated or emotionally abused in other ways by your partner or ex-partner?: Patient unable to answer    Within the last year, have you been kicked, hit, slapped, or otherwise physically hurt by your partner or ex-partner?: Patient unable to answer    Within the last year, have you been raped or forced to have any kind of sexual activity by your partner or ex-partner?: Patient unable to answer    Physical Exam: Vital signs in last 24 hours: @BP  (!) 162/70   Pulse (!) 51   Temp (!) 96.8 F (36 C)   Resp 13   Ht 5' 9 (1.753 m)   Wt 220 lb (99.8 kg)   SpO2 99%   BMI 32.49 kg/m  GEN: NAD EYE: Sclerae anicteric ENT:  MMM CV: Non-tachycardic Pulm: CTA b/l GI: Soft, NT/ND NEURO:  Alert & Oriented x 3   Sandor Flatter, DO [facility]   11/05/2023 8:50 AM

## 2023-11-05 NOTE — Progress Notes (Signed)
 Report to PACU, RN, vss, BBS= Clear.

## 2023-11-05 NOTE — Progress Notes (Signed)
 Called to room to assist during endoscopic procedure.  Patient ID and intended procedure confirmed with present staff. Received instructions for my participation in the procedure from the performing physician.

## 2023-11-05 NOTE — Patient Instructions (Signed)

## 2023-11-05 NOTE — Progress Notes (Signed)
 Pt's states no medical or surgical changes since previsit or office visit.

## 2023-11-06 ENCOUNTER — Other Ambulatory Visit

## 2023-11-06 ENCOUNTER — Telehealth: Payer: Self-pay

## 2023-11-06 ENCOUNTER — Ambulatory Visit: Payer: Self-pay | Admitting: Urology

## 2023-11-06 ENCOUNTER — Other Ambulatory Visit: Payer: Self-pay

## 2023-11-06 DIAGNOSIS — N390 Urinary tract infection, site not specified: Secondary | ICD-10-CM

## 2023-11-06 LAB — URINALYSIS, ROUTINE W REFLEX MICROSCOPIC
Bilirubin, UA: NEGATIVE
Glucose, UA: NEGATIVE
Ketones, UA: NEGATIVE
Nitrite, UA: POSITIVE — AB
Protein,UA: NEGATIVE
Specific Gravity, UA: 1.01 (ref 1.005–1.030)
Urobilinogen, Ur: 0.2 mg/dL (ref 0.2–1.0)
pH, UA: 6.5 (ref 5.0–7.5)

## 2023-11-06 LAB — MICROSCOPIC EXAMINATION
Epithelial Cells (non renal): NONE SEEN /HPF (ref 0–10)
RBC, Urine: NONE SEEN /HPF (ref 0–2)
WBC, UA: >30 /HPF — ABNORMAL HIGH (ref 0–5)

## 2023-11-06 MED ORDER — CEFDINIR 300 MG PO CAPS: 300.0000 mg | ORAL_CAPSULE | Freq: Two times a day (BID) | ORAL | 0 refills | Status: AC

## 2023-11-06 NOTE — Telephone Encounter (Signed)
 Left message

## 2023-11-07 LAB — SURGICAL PATHOLOGY

## 2023-11-10 LAB — URINE CULTURE

## 2023-11-11 ENCOUNTER — Ambulatory Visit: Payer: Self-pay | Admitting: Gastroenterology

## 2023-11-17 ENCOUNTER — Encounter: Payer: Self-pay | Admitting: Urology

## 2023-11-18 ENCOUNTER — Other Ambulatory Visit

## 2023-11-18 ENCOUNTER — Other Ambulatory Visit: Payer: Self-pay | Admitting: Urology

## 2023-11-18 ENCOUNTER — Other Ambulatory Visit: Payer: Self-pay

## 2023-11-18 ENCOUNTER — Ambulatory Visit: Payer: Self-pay | Admitting: Urology

## 2023-11-18 ENCOUNTER — Ambulatory Visit: Admitting: Urology

## 2023-11-18 DIAGNOSIS — N390 Urinary tract infection, site not specified: Secondary | ICD-10-CM

## 2023-11-18 LAB — URINALYSIS, ROUTINE W REFLEX MICROSCOPIC
Bilirubin, UA: NEGATIVE
Glucose, UA: NEGATIVE
Ketones, UA: NEGATIVE
Leukocytes,UA: NEGATIVE
Nitrite, UA: NEGATIVE
Protein,UA: NEGATIVE
RBC, UA: NEGATIVE
Specific Gravity, UA: 1.015 (ref 1.005–1.030)
Urobilinogen, Ur: 1 mg/dL (ref 0.2–1.0)
pH, UA: 7.5 (ref 5.0–7.5)

## 2023-11-18 MED ORDER — NITROFURANTOIN MONOHYD MACRO 100 MG PO CAPS: 100.0000 mg | ORAL_CAPSULE | Freq: Every day | ORAL | 2 refills | Status: DC

## 2023-11-21 ENCOUNTER — Ambulatory Visit (INDEPENDENT_AMBULATORY_CARE_PROVIDER_SITE_OTHER): Admitting: Family Medicine

## 2023-11-21 ENCOUNTER — Encounter: Payer: Self-pay | Admitting: Family Medicine

## 2023-11-21 ENCOUNTER — Ambulatory Visit (HOSPITAL_BASED_OUTPATIENT_CLINIC_OR_DEPARTMENT_OTHER)
Admission: RE | Admit: 2023-11-21 | Discharge: 2023-11-21 | Disposition: A | Discharge status: Home / Self Care | Source: Ambulatory Visit | Attending: Family Medicine | Admitting: Family Medicine

## 2023-11-21 ENCOUNTER — Encounter (HOSPITAL_BASED_OUTPATIENT_CLINIC_OR_DEPARTMENT_OTHER): Payer: Self-pay

## 2023-11-21 VITALS — BP 136/82 | HR 74 | Temp 97.9°F | Wt 219.0 lb

## 2023-11-21 DIAGNOSIS — R1915 Other abnormal bowel sounds: Secondary | ICD-10-CM

## 2023-11-21 DIAGNOSIS — R1084 Generalized abdominal pain: Secondary | ICD-10-CM | POA: Diagnosis present

## 2023-11-21 DIAGNOSIS — R109 Unspecified abdominal pain: Secondary | ICD-10-CM

## 2023-11-21 DIAGNOSIS — Z87448 Personal history of other diseases of urinary system: Secondary | ICD-10-CM | POA: Diagnosis not present

## 2023-11-21 LAB — CBC WITH DIFFERENTIAL/PLATELET
Basophils Absolute: 0 K/uL (ref 0.0–0.1)
Basophils Relative: 0.4 % (ref 0.0–3.0)
Eosinophils Absolute: 0.1 K/uL (ref 0.0–0.7)
Eosinophils Relative: 0.9 % (ref 0.0–5.0)
HCT: 41.4 % (ref 36.0–46.0)
Hemoglobin: 13.9 g/dL (ref 12.0–15.0)
Lymphocytes Relative: 39.3 % (ref 12.0–46.0)
Lymphs Abs: 2.5 K/uL (ref 0.7–4.0)
MCHC: 33.6 g/dL (ref 30.0–36.0)
MCV: 92 fl (ref 78.0–100.0)
Monocytes Absolute: 0.4 K/uL (ref 0.1–1.0)
Monocytes Relative: 6.5 % (ref 3.0–12.0)
Neutro Abs: 3.4 K/uL (ref 1.4–7.7)
Neutrophils Relative %: 52.9 % (ref 43.0–77.0)
Platelets: 233 K/uL (ref 150.0–400.0)
RBC: 4.5 Mil/uL (ref 3.87–5.11)
RDW: 13 % (ref 11.5–15.5)
WBC: 6.3 K/uL (ref 4.0–10.5)

## 2023-11-21 LAB — POC URINALSYSI DIPSTICK (AUTOMATED)
Bilirubin, UA: NEGATIVE
Glucose, UA: NEGATIVE
Ketones, UA: NEGATIVE
Leukocytes, UA: NEGATIVE
Nitrite, UA: NEGATIVE
Protein, UA: NEGATIVE
Spec Grav, UA: 1.015 (ref 1.010–1.025)
Urobilinogen, UA: NEGATIVE U/dL — AB
pH, UA: 6 (ref 5.0–8.0)

## 2023-11-21 LAB — COMPREHENSIVE METABOLIC PANEL WITH GFR
ALT: 36 U/L — ABNORMAL HIGH (ref 0–35)
AST: 25 U/L (ref 0–37)
Albumin: 4.2 g/dL (ref 3.5–5.2)
Alkaline Phosphatase: 72 U/L (ref 39–117)
BUN: 16 mg/dL (ref 6–23)
CO2: 33 meq/L — ABNORMAL HIGH (ref 19–32)
Calcium: 9.1 mg/dL (ref 8.4–10.5)
Chloride: 99 meq/L (ref 96–112)
Creatinine, Ser: 0.58 mg/dL (ref 0.40–1.20)
GFR: 91.33 mL/min (ref >60.00)
Glucose, Bld: 188 mg/dL — ABNORMAL HIGH (ref 70–99)
Potassium: 4.3 meq/L (ref 3.5–5.1)
Sodium: 137 meq/L (ref 135–145)
Total Bilirubin: 0.5 mg/dL (ref 0.2–1.2)
Total Protein: 6.4 g/dL (ref 6.0–8.3)

## 2023-11-21 MED ORDER — IOHEXOL 300 MG/ML  SOLN
100.0000 mL | Freq: Once | INTRAMUSCULAR | Status: AC | PRN
  Administered 2023-11-21: 100 mL via INTRAVENOUS

## 2023-11-21 NOTE — Patient Instructions (Signed)

## 2023-11-21 NOTE — Progress Notes (Signed)
 Julie Osborne , 1950/05/31, 73 y.o., female MRN: [id number] Patient Care Team    Relationship Specialty Notifications Start End  Julie Osborne LABOR, DO PCP - General Family Medicine  01/13/16   Julie Osborne, DPM Referring Physician Podiatry  12/24/19   Julie Jerona GAILS, MD Consulting Physician Orthopedic Surgery  08/16/20   Dietels, Mal LABOR, OD  Optometry  07/08/21   Julie Layman BROCKS, MD Referring Physician Urology  07/17/23   Julie Warren PARAS, FNP Nurse Practitioner Endocrinology  07/17/23    Comment: [facility]  [address]  [place], [place] [address]  [phone]    Chief Complaint  Patient presents with   Flank Pain    Since colonoscopy on 7/29; L sided flank pain. Mild urinary frequency. Pt has tried Tylenol .      Subjective: Julie Osborne is a 73 y.o. Pt presents for an OV with complaints of left-sided abdominal and flank pain.  She had a colonoscopy on 11/05/2023 at which she was noted to have significant redundant sigmoid bowel loops, requiring extra manipulation during colonoscopy.  She was not completely cleaned out and required copious lavage of the colon.  She had x 1 ascending polyp removed.  She states since her colonoscopy she has had left flank and left upper quadrant pain.  At first she thought it was gas, which she has not noticed increase in flatulence or belching.  She is tolerating p.o. she reports she did go few days without a bowel movement after the colonoscopy, but now she is having routine bowel movement daily.  No blood per rectum per patient.  She reports she had a low-grade fever yesterday 99.8 F.   No noted diverticulosis. Patient also states she has some mild urinary frequency. She has been taking Tylenol  for discomfort.    07/17/2023    8:09 AM 05/08/2023   11:10 AM 10/24/2022   10:37 AM 08/01/2022    8:41 AM 06/25/2022   11:33 AM  Depression screen PHQ 2/9  Decreased Interest 0 0 0 0 0  Down,  Depressed, Hopeless 0 0 0 0 0  PHQ - 2 Score 0 0 0 0 0  Altered sleeping 0 0 0    Tired, decreased energy 0 0 0    Change in appetite 0 0 0    Feeling bad or failure about yourself  0 0 0    Trouble concentrating 0 0 0    Moving slowly or fidgety/restless 0 0 0    Suicidal thoughts 0 0 0    PHQ-9 Score 0 0 0    Difficult doing work/chores Not difficult at all Not difficult at all Not difficult at all      Allergies  Allergen Reactions   Statins Other (See Comments)    Lipitor,simvatatin, pravastatin.welchol cause myalgia   Social History   Social History Narrative   Married to [employer], 2 children.    BS, RN retired now Agricultural consultant.    Drinks caffeine, uses herbal remedies. Daily vitmain use.   Wears her seatbelt, smoke detector in the home.    exercises routinely.    She feels safe in her relationships.    Past Medical History:  Diagnosis Date   Apnea    Closed fracture of neck of left radius 10/14/2017   Concussion    Degenerative arthritis    generalized/back/LEFT foot   Diabetic neuropathy (HCC) 2017   Diverticulosis 06/19/2013   sigmoid  colon on colonoscopy   E-coli UTI 04/17/2023   Hyperlipidemia 2000   on meds   Hypertension, benign 1996   on meds   Lumbar radiculopathy    Severe L4/L5 radiculopathy by old records.    OSA (obstructive sleep apnea)    Pes planus 12/17/2011   Posterior tibial tendon dysfunction (PTTD) of left lower extremity 04/10/2015   post tibial tendon dysfunction stage 5 (PTTD)   Pyelonephritis 04/17/2023   Situational anxiety 03/30/2020   Tenosynovitis of ankle 2017   post tibial tendon dysfunction stage 5 (PTTD)   Uncontrolled diabetes mellitus 2005   on meds   Past Surgical History:  Procedure Laterality Date   CORONARY ANGIOPLASTY WITH STENT PLACEMENT  10/17/2018   DES x2/LAD   EXPLORATORY LAPAROTOMY  1976   endometriosis   GASTRIC BYPASS  2024   MINOR HEMORRHOIDECTOMY  2006   OTHER SURGICAL HISTORY Right 1975   venous ligation    Family History  Problem Relation Age of Onset   COPD Mother    Diabetes Mellitus II Mother    Heart failure Mother    Osteoarthritis Father    Cancer Father        tumor found behind xyphoid process   Diabetes Sister    Diabetes Brother    Dementia Maternal Grandmother    Heart disease Maternal Grandmother    Heart disease Maternal Grandfather    Stroke Maternal Grandfather    Heart failure Maternal Aunt    Lung cancer Paternal Grandfather    Lung cancer Paternal Aunt    Colon polyps Neg Hx    Colon cancer Neg Hx    Esophageal cancer Neg Hx    Stomach cancer Neg Hx    Rectal cancer Neg Hx    Allergies as of 11/21/2023       Reactions   Statins Other (See Comments)   Lipitor,simvatatin, pravastatin.welchol cause myalgia        Medication List        Accurate as of November 21, 2023 10:46 AM. If you have any questions, ask your nurse or doctor.          albuterol  108 (90 Base) MCG/ACT inhaler Commonly known as: VENTOLIN  HFA Inhale 1-2 puffs into the lungs every 6 (six) hours as needed for wheezing or shortness of breath.   amLODipine  5 MG tablet Commonly known as: NORVASC  TAKE 1 TABLET (5 MG TOTAL) BY MOUTH DAILY.   aspirin EC 81 MG tablet Take 81 mg by mouth daily.   Docusate Sodium 150 MG/15ML syrup Take 50 mg by mouth 2 (two) times daily.   estradiol  0.1 MG/GM vaginal cream Commonly known as: ESTRACE  Apply pea sized amount on fingertip 3x weekly   fluticasone  50 MCG/ACT nasal spray Commonly known as: FLONASE  Place 2 sprays into both nostrils daily.   Insulin  Aspart FlexPen 100 UNIT/ML Commonly known as: NOVOLOG Inject 4 Units into the skin 3 (three) times daily. 4-12 units per sliding scale   insulin  degludec 200 UNIT/ML FlexTouch Pen Commonly known as: TRESIBA Inject 30 Units into the skin at bedtime.   metoprolol  succinate 50 MG 24 hr tablet Commonly known as: TOPROL -XL Take 1 tablet (50 mg total) by mouth daily.   Mounjaro 2.5 MG/0.5ML  Pen Generic drug: tirzepatide Inject 2.5 mg into the skin once a week.   Mounjaro 5 MG/0.5ML Pen Generic drug: tirzepatide Inject 5 mg into the skin once a week.   multivitamin capsule Take 1 capsule by mouth daily.  nitrofurantoin  (macrocrystal-monohydrate) 100 MG capsule Commonly known as: MACROBID  Take 1 capsule (100 mg total) by mouth daily.   nitroGLYCERIN 0.4 MG SL tablet Commonly known as: NITROSTAT Place under the tongue.   omeprazole 20 MG capsule Commonly known as: PRILOSEC Take 20 mg by mouth daily.   ondansetron  8 MG tablet Commonly known as: ZOFRAN  Take 8 mg by mouth every 8 (eight) hours as needed for nausea or vomiting.   rosuvastatin  20 MG tablet Commonly known as: CRESTOR  Take 1 tablet (20 mg total) by mouth daily.   senna-docusate 8.6-50 MG tablet Commonly known as: Senokot-S Take 1 tablet by mouth 2 (two) times daily.   sulfamethoxazole -trimethoprim  400-80 MG tablet Commonly known as: Bactrim  Take 1 tablet by mouth at bedtime.   tiZANidine  4 MG tablet Commonly known as: ZANAFLEX  Take 1 tablet (4 mg total) by mouth every 6 (six) hours as needed for muscle spasms.   Toujeo  Max SoloStar 300 UNIT/ML Solostar Pen Generic drug: insulin  glargine (2 Unit Dial) Inject into the skin. 45 unit sub q PM   valsartan  320 MG tablet Commonly known as: DIOVAN  Take 1 tablet (320 mg total) by mouth daily.   Vitamin D  (Ergocalciferol ) 1.25 MG (50000 UNIT) Caps capsule Commonly known as: DRISDOL Take 50,000 Units by mouth once a week.        All past medical history, surgical history, allergies, family history, immunizations andmedications were updated in the EMR today and reviewed under the history and medication portions of their EMR.     ROS Negative, with the exception of above mentioned in HPI   Objective:  BP 136/82   Pulse 74   Temp 97.9 F (36.6 C)   Wt 219 lb (99.3 kg)   SpO2 98%   BMI 32.34 kg/m  Body mass index is 32.34  kg/m. Physical Exam Vitals and nursing note reviewed.  Constitutional:      General: She is not in acute distress.    Appearance: Normal appearance. She is not ill-appearing, toxic-appearing or diaphoretic.  HENT:     Head: Normocephalic and atraumatic.  Eyes:     General: No scleral icterus.       Right eye: No discharge.        Left eye: No discharge.     Extraocular Movements: Extraocular movements intact.     Conjunctiva/sclera: Conjunctivae normal.     Pupils: Pupils are equal, round, and reactive to light.  Cardiovascular:     Rate and Rhythm: Normal rate and regular rhythm.  Pulmonary:     Effort: Pulmonary effort is normal. No respiratory distress.     Breath sounds: Normal breath sounds. No wheezing, rhonchi or rales.  Abdominal:     General: Abdomen is protuberant. Bowel sounds are increased. There is distension.     Palpations: Abdomen is soft. There is no mass.     Tenderness: There is abdominal tenderness in the left upper quadrant. There is guarding. There is no right CVA tenderness, left CVA tenderness or rebound.     Hernia: No hernia is present.      Comments: High-pitched bowel sounds, increased bowel sounds.  Tenderness to palpation over left upper quadrant and left flank.  Musculoskeletal:     Right lower leg: No edema.     Left lower leg: No edema.  Skin:    General: Skin is warm.     Findings: No rash.  Neurological:     Mental Status: She is alert and oriented to person, place,  and time. Mental status is at baseline.     Motor: No weakness.     Gait: Gait normal.  Psychiatric:        Mood and Affect: Mood normal.        Behavior: Behavior normal.        Thought Content: Thought content normal.        Judgment: Judgment normal.      No results found. No results found. Results for orders placed or performed in visit on 11/21/23 (from the past 24 hours)  POCT Urinalysis Dipstick (Automated)     Status: Abnormal   Collection Time: 11/21/23 10:45  AM  Result Value Ref Range   Color, UA yellow    Clarity, UA clear    Glucose, UA Negative Negative   Bilirubin, UA negative    Ketones, UA negative    Spec Grav, UA 1.015 1.010 - 1.025   Blood, UA none    pH, UA 6.0 5.0 - 8.0   Protein, UA Negative Negative   Urobilinogen, UA negative (A) 0.2 or 1.0 E.U./dL   Nitrite, UA negative    Leukocytes, UA Negative Negative    Assessment/Plan: Tocarra Gassen is a 73 y.o. female present for OV for   Flank pain (Primary)/history of pyelonephritis/left upper quadrant pain/abnormal bowel sounds Reviewed patient's colonoscopy report from 11/05/2023.  She had significant redundant sigmoid loops requiring extra manipulation during colonoscopy be able to visualize colon.  Exam today with hyperactive bowel sounds, high-pitched bowel sounds.  Suspect her discomfort is coming from the redundant bowel loops, cannot rule out partial obstruction. Cannot rule out pyelonephritis or kidney stone with her history, although my care urine today does not appear infectious and she does not have CVA tenderness on exam. We discussed muscle relaxers and pain medication, and we elected to not start these considering they may slow down that given work for her. She will follow a liquid/soft bland diet.  She can continue the Gas-X. Will collect labs today and stat CT abdomen - POCT Urinalysis Dipstick (Automated) - Urinalysis w microscopic + reflex cultur - Comp Met (CMET) -CBC with differential - CT ABDOMEN PELVIS W CONTRAST; Future Patient will be called with results and further plan discussed at that time.   Reviewed expectations re: course of current medical issues. Discussed self-management of symptoms. Outlined signs and symptoms indicating need for more acute intervention. Patient verbalized understanding and all questions were answered. Patient received an After-Visit Summary.    Orders Placed This Encounter  Procedures   Urinalysis w microscopic +  reflex cultur   Comp Met (CMET)   CBC w/Diff   POCT Urinalysis Dipstick (Automated)   No orders of the defined types were placed in this encounter.  Referral Orders  No referral(s) requested today   45 minutes spent during patient encounter today reviewing recent colonoscopy report follow-up wondering labs and stat CT abdomen ordered.  Note is dictated utilizing voice recognition software. Although note has been proof read prior to signing, occasional typographical errors still can be missed. If any questions arise, please do not hesitate to call for verification.   electronically signed by:  Osborne Bellini, DO  [facility] [place]

## 2023-11-22 ENCOUNTER — Encounter (HOSPITAL_BASED_OUTPATIENT_CLINIC_OR_DEPARTMENT_OTHER): Payer: Self-pay | Admitting: Emergency Medicine

## 2023-11-22 ENCOUNTER — Other Ambulatory Visit: Payer: Self-pay

## 2023-11-22 ENCOUNTER — Emergency Department (HOSPITAL_BASED_OUTPATIENT_CLINIC_OR_DEPARTMENT_OTHER)

## 2023-11-22 ENCOUNTER — Ambulatory Visit: Payer: Self-pay | Admitting: Family Medicine

## 2023-11-22 ENCOUNTER — Emergency Department (HOSPITAL_BASED_OUTPATIENT_CLINIC_OR_DEPARTMENT_OTHER): Admission: EM | Admit: 2023-11-22 | Discharge: 2023-11-22 | Disposition: A | Discharge status: Home / Self Care

## 2023-11-22 DIAGNOSIS — Z794 Long term (current) use of insulin: Secondary | ICD-10-CM | POA: Diagnosis not present

## 2023-11-22 DIAGNOSIS — R109 Unspecified abdominal pain: Secondary | ICD-10-CM

## 2023-11-22 DIAGNOSIS — R10819 Abdominal tenderness, unspecified site: Secondary | ICD-10-CM | POA: Insufficient documentation

## 2023-11-22 DIAGNOSIS — Z7982 Long term (current) use of aspirin: Secondary | ICD-10-CM | POA: Diagnosis not present

## 2023-11-22 LAB — URINALYSIS W MICROSCOPIC + REFLEX CULTURE
Bacteria, UA: NONE SEEN /HPF
Bilirubin Urine: NEGATIVE
Hgb urine dipstick: NEGATIVE
Hyaline Cast: NONE SEEN /LPF
Ketones, ur: NEGATIVE
Leukocyte Esterase: NEGATIVE
Nitrites, Initial: NEGATIVE
Protein, ur: NEGATIVE
RBC / HPF: NONE SEEN /HPF (ref 0–2)
Specific Gravity, Urine: 1.017 (ref 1.001–1.035)
Squamous Epithelial / HPF: NONE SEEN /HPF (ref <=5)
WBC, UA: NONE SEEN /HPF (ref 0–5)
pH: 6.5 (ref 5.0–8.0)

## 2023-11-22 LAB — BASIC METABOLIC PANEL WITH GFR
Anion gap: 10 (ref 5–15)
BUN: 13 mg/dL (ref 8–23)
CO2: 26 mmol/L (ref 22–32)
Calcium: 9.4 mg/dL (ref 8.9–10.3)
Chloride: 99 mmol/L (ref 98–111)
Creatinine, Ser: 0.62 mg/dL (ref 0.44–1.00)
GFR, Estimated: >60 mL/min
Glucose, Bld: 161 mg/dL — ABNORMAL HIGH (ref 70–99)
Potassium: 4.2 mmol/L (ref 3.5–5.1)
Sodium: 135 mmol/L (ref 135–145)

## 2023-11-22 LAB — URINALYSIS, ROUTINE W REFLEX MICROSCOPIC
Bilirubin Urine: NEGATIVE
Glucose, UA: NEGATIVE mg/dL
Hgb urine dipstick: NEGATIVE
Ketones, ur: NEGATIVE mg/dL
Leukocytes,Ua: NEGATIVE
Nitrite: NEGATIVE
Protein, ur: NEGATIVE mg/dL
Specific Gravity, Urine: 1.01 (ref 1.005–1.030)
pH: 6 (ref 5.0–8.0)

## 2023-11-22 LAB — CBC
HCT: 40.9 % (ref 36.0–46.0)
Hemoglobin: 14 g/dL (ref 12.0–15.0)
MCH: 31.5 pg (ref 26.0–34.0)
MCHC: 34.2 g/dL (ref 30.0–36.0)
MCV: 92.1 fL (ref 80.0–100.0)
Platelets: 234 K/uL (ref 150–400)
RBC: 4.44 MIL/uL (ref 3.87–5.11)
RDW: 12.6 % (ref 11.5–15.5)
WBC: 8.4 K/uL (ref 4.0–10.5)
nRBC: 0 % (ref 0.0–0.2)

## 2023-11-22 LAB — LACTIC ACID, PLASMA: Lactic Acid, Venous: 0.5 mmol/L (ref 0.5–1.9)

## 2023-11-22 LAB — NO CULTURE INDICATED

## 2023-11-22 MED ORDER — IOHEXOL 350 MG/ML SOLN
100.0000 mL | Freq: Once | INTRAVENOUS | Status: AC | PRN
  Administered 2023-11-22: 100 mL via INTRAVENOUS

## 2023-11-22 NOTE — ED Provider Notes (Signed)
 [facility] Provider Note   CSN: [id number] Arrival date & time: 11/22/23  1433     Patient presents with: Flank Pain   Tamecka Milham is a 73 y.o. female.   73 year old female presents for evaluation of flank pain.  It has been on her left flank for a while.  Had a CT scan yesterday as an outpatient and primary care doctor sent her in here due to the results.  The results show changes in the mesentery and primary care doctor was concerned she might need GI or surgery consults.  Patient denies any other symptoms or concerns or any other changes in her symptomatology at this time.   Flank Pain Pertinent negatives include no chest pain, no abdominal pain and no shortness of breath.       Prior to Admission medications   Medication Sig Start Date End Date Taking? Authorizing Provider  albuterol  (VENTOLIN  HFA) 108 (90 Base) MCG/ACT inhaler Inhale 1-2 puffs into the lungs every 6 (six) hours as needed for wheezing or shortness of breath. 07/17/23   Kuneff, Renee A, DO  amLODipine  (NORVASC ) 5 MG tablet TAKE 1 TABLET (5 MG TOTAL) BY MOUTH DAILY. 10/14/23   Kuneff, Renee A, DO  aspirin EC 81 MG tablet Take 81 mg by mouth daily.    [provider]  Docusate Sodium 150 MG/15ML syrup Take 50 mg by mouth 2 (two) times daily.    [provider]  estradiol  (ESTRACE ) 0.1 MG/GM vaginal cream Apply pea sized amount on fingertip 3x weekly 05/15/23   Shona Layman BROCKS, MD  fluticasone  (FLONASE ) 50 MCG/ACT nasal spray Place 2 sprays into both nostrils daily. 07/17/23   Kuneff, Renee A, DO  Insulin  Aspart FlexPen (NOVOLOG) 100 UNIT/ML Inject 4 Units into the skin 3 (three) times daily. 4-12 units per sliding scale 09/10/23   [provider]  insulin  degludec (TRESIBA) 200 UNIT/ML FlexTouch Pen Inject 30 Units into the skin at bedtime.    [provider]  metoprolol  succinate (TOPROL -XL) 50 MG 24 hr tablet Take 1 tablet (50 mg total) by  mouth daily. 07/17/23   Kuneff, Renee A, DO  MOUNJARO 2.5 MG/0.5ML Pen Inject 2.5 mg into the skin once a week. 09/05/23   [provider]  Multiple Vitamin (MULTIVITAMIN) capsule Take 1 capsule by mouth daily.     [provider]  nitrofurantoin , macrocrystal-monohydrate, (MACROBID ) 100 MG capsule Take 1 capsule (100 mg total) by mouth daily. 11/18/23   Stoneking, Adine PARAS., MD  nitroGLYCERIN (NITROSTAT) 0.4 MG SL tablet Place under the tongue.  10/18/18   [provider]  omeprazole (PRILOSEC) 20 MG capsule Take 20 mg by mouth daily.    [provider]  ondansetron  (ZOFRAN ) 8 MG tablet Take 8 mg by mouth every 8 (eight) hours as needed for nausea or vomiting.    [provider]  rosuvastatin  (CRESTOR ) 20 MG tablet Take 1 tablet (20 mg total) by mouth daily. 07/17/23   Kuneff, Renee A, DO  senna-docusate (SENOKOT-S) 8.6-50 MG tablet Take 1 tablet by mouth 2 (two) times daily.    [provider]  sulfamethoxazole -trimethoprim  (BACTRIM ) 400-80 MG tablet Take 1 tablet by mouth at bedtime. 09/11/23   Stoneking, Adine PARAS., MD  tirzepatide [facility]) 5 MG/0.5ML Pen Inject 5 mg into the skin once a week. Patient not taking: Reported on 11/21/2023 09/26/23   [provider]  tiZANidine  (ZANAFLEX ) 4 MG tablet Take 1 tablet (4 mg total) by  mouth every 6 (six) hours as needed for muscle spasms. 07/17/23   Kuneff, Renee A, DO  TOUJEO  MAX SOLOSTAR 300 UNIT/ML Solostar Pen Inject into the skin. 45 unit sub q PM Patient not taking: Reported on 11/21/2023 04/03/22   [provider]  valsartan  (DIOVAN ) 320 MG tablet Take 1 tablet (320 mg total) by mouth daily. 07/17/23   Kuneff, Renee A, DO  Vitamin D , Ergocalciferol , (DRISDOL) 1.25 MG (50000 UNIT) CAPS capsule Take 50,000 Units by mouth once a week. 06/29/23   [provider]    Allergies: Statins    Review of Systems  Constitutional:  Negative for chills and fever.  HENT:  Negative for ear  pain and sore throat.   Eyes:  Negative for pain and visual disturbance.  Respiratory:  Negative for cough and shortness of breath.   Cardiovascular:  Negative for chest pain and palpitations.  Gastrointestinal:  Negative for abdominal pain and vomiting.  Genitourinary:  Positive for flank pain. Negative for dysuria and hematuria.  Musculoskeletal:  Negative for arthralgias and back pain.  Skin:  Negative for color change and rash.  Neurological:  Negative for seizures and syncope.  All other systems reviewed and are negative.   Updated Vital Signs BP (!) 155/81 (BP Location: Right Arm)   Pulse 62   Temp 98.3 F (36.8 C) (Oral)   Resp 18   Ht 5' 9.5 (1.765 m)   Wt 99.3 kg   SpO2 99%   BMI 31.88 kg/m   Physical Exam Vitals and nursing note reviewed.  Constitutional:      General: She is not in acute distress.    Appearance: Normal appearance. She is well-developed. She is not ill-appearing.  HENT:     Head: Normocephalic and atraumatic.  Eyes:     Conjunctiva/sclera: Conjunctivae normal.  Cardiovascular:     Rate and Rhythm: Normal rate and regular rhythm.     Pulses: Normal pulses.     Heart sounds: Normal heart sounds. No murmur heard. Pulmonary:     Effort: Pulmonary effort is normal. No respiratory distress.     Breath sounds: Normal breath sounds. No stridor. No wheezing or rhonchi.  Abdominal:     Palpations: Abdomen is soft.     Tenderness: There is abdominal tenderness.     Comments: Diminished bowel sounds  Musculoskeletal:        General: No swelling.     Cervical back: Neck supple.  Skin:    General: Skin is warm and dry.     Capillary Refill: Capillary refill takes less than 2 seconds.  Neurological:     Mental Status: She is alert.  Psychiatric:        Mood and Affect: Mood normal.     (all labs ordered are listed, but only abnormal results are displayed) Labs Reviewed  BASIC METABOLIC PANEL WITH GFR - Abnormal; Notable for the following  components:      Result Value   Glucose, Bld 161 (*)    All other components within normal limits  URINALYSIS, ROUTINE W REFLEX MICROSCOPIC  CBC  LACTIC ACID, PLASMA  LACTIC ACID, PLASMA    EKG: None  Radiology: CT Angio Abdomen W and/or Wo Contrast Result Date: 11/22/2023 CLINICAL DATA:  has mesenteric twisting on outpatient ct scan done a few days ago. Left flank pain intermittent x 2 weeks after colonoscopy. Pt was treated for UTI recently but pain persists. No prior hx kidney stones or pyelo. Pt had CT scan yesterday  and shows that her bowel is twisted EXAM: CT ANGIOGRAPHY ABDOMEN TECHNIQUE: Multidetector CT imaging of the abdomen was performed using the standard protocol during bolus administration of intravenous contrast. Multiplanar reconstructed images and MIPs were obtained and reviewed to evaluate the vascular anatomy. RADIATION DOSE REDUCTION: This exam was performed according to the departmental dose-optimization program which includes automated exposure control, adjustment of the mA and/or kV according to patient size and/or use of iterative reconstruction technique. CONTRAST:  OMNIPAQUE  IOHEXOL  350 MG/ML SOLN COMPARISON:  None Available. FINDINGS: VASCULAR Aorta: Severe atherosclerotic plaque normal caliber aorta without aneurysm, dissection, vasculitis or significant stenosis. Celiac: Mild atherosclerotic plaque. Patent without evidence of aneurysm, dissection, vasculitis or significant stenosis. SMA: Patent without evidence of aneurysm, dissection, vasculitis or significant stenosis. Renals: Mild atherosclerotic plaque. Both renal arteries are patent without evidence of aneurysm, dissection, vasculitis, fibromuscular dysplasia or significant stenosis. Visualized portions of the IMA: Patent without evidence of aneurysm, dissection, vasculitis or significant stenosis. Veins: No mesenteric or portal venous gas. The main portal, splenic, superior mesenteric veins are patent.  Review of the MIP images confirms the above findings. NON-VASCULAR Lower chest: Trace pericardial effusion.  Small hiatal hernia. Hepatobiliary: No focal liver abnormality. No gallstones, gallbladder wall thickening, or pericholecystic fluid. No biliary dilatation. Pancreas: Diffusely atrophic. No focal lesion. Otherwise normal pancreatic contour. No surrounding inflammatory changes. No main pancreatic ductal dilatation. Spleen: Normal in size without focal abnormality. Adrenals/Urinary Tract: No adrenal nodule bilaterally. Bilateral kidneys enhance symmetrically. No hydronephrosis. No hydroureter. The urinary bladder is unremarkable. Stomach/partially visualized bowel: Roux-en-Y gastric bypass surgical changes. Stomach is within normal limits. No evidence of bowel wall thickening or dilatation. No prominent mesenteric swirling. No mesenteric edema. Stool throughout the visualized colon. Lymphatic: No retroperitoneal or mesenteric lymphadenopathy. Other: No intraperitoneal free fluid. No intraperitoneal free gas. No organized fluid collection. Musculoskeletal: No abdominal wall hernia or abnormality. No suspicious lytic or blastic osseous lesions. No acute displaced fracture. Grade 1 anterolisthesis of L3 on L4. Multilevel severe degenerative changes of the spine. IMPRESSION: VASCULAR 1. No acute abnormality. 2.  Aortic Atherosclerosis (ICD10-I70.0). NON-VASCULAR 1. No acute intra-abdominal abnormality. 2. Trace pericardial effusion. 3. Small hiatal hernia. 4. Roux-en-Y gastric bypass. Electronically Signed   By: Morgane  Naveau M.D.   On: 11/22/2023 18:00   CT ABDOMEN PELVIS W CONTRAST Result Date: 11/21/2023 CLINICAL DATA:  History of colonoscopy on November 05, 2023 with subsequent left-sided abdominal pain. EXAM: CT ABDOMEN AND PELVIS WITH CONTRAST TECHNIQUE: Multidetector CT imaging of the abdomen and pelvis was performed using the standard protocol following bolus administration of intravenous contrast.  RADIATION DOSE REDUCTION: This exam was performed according to the departmental dose-optimization program which includes automated exposure control, adjustment of the mA and/or kV according to patient size and/or use of iterative reconstruction technique. CONTRAST:  OMNIPAQUE  IOHEXOL  300 MG/ML  SOLN COMPARISON:  May 16, 2023 FINDINGS: Lower chest: No acute abnormality. Hepatobiliary: No focal liver abnormality is seen. No gallstones, gallbladder wall thickening, or biliary dilatation. Pancreas: Diffuse pancreatic atrophy is noted with no pancreatic ductal dilatation or surrounding inflammatory changes. Spleen: A 10 mm cyst is seen within the anterior aspect of an otherwise normal-appearing spleen. Adrenals/Urinary Tract: Adrenal glands are unremarkable. Kidneys are normal, without renal calculi, focal lesion, or hydronephrosis. Bladder is unremarkable. Stomach/Bowel: Surgical sutures are seen throughout the gastric region. Surgically anastomosed bowel is also seen within the anterior aspect of the mid left abdomen. Appendix appears normal. Stool is seen throughout the large bowel. No evidence of  bowel wall thickening, distention, or inflammatory changes. Mild mesenteric twisting is seen along the medial aspect of the mid to lower left abdomen (axial CT images 46 through 56, CT series 301). This is not clearly identified on the prior study. Vascular/Lymphatic: Aortic atherosclerosis. No enlarged abdominal or pelvic lymph nodes. Reproductive: Status post hysterectomy. No adnexal masses. Other: No abdominal wall hernia or abnormality. No abdominopelvic ascites or intra-abdominal free air. Musculoskeletal: Multilevel degenerative changes are present throughout the lumbar spine. IMPRESSION: 1. Mild mesenteric twisting along the medial aspect of the mid to lower left abdomen, which may represent a mild internal hernia. 2. Evidence of prior gastric and bowel surgery. 3. Aortic atherosclerosis. Electronically  Signed   By: Suzen Dials M.D.   On: 11/21/2023 18:53     Procedures   Medications Ordered in the ED  iohexol  (OMNIPAQUE ) 350 MG/ML injection 100 mL (100 mLs Intravenous Contrast Given 11/22/23 1729)                                    Medical Decision Making Patient here for abnormal CT scan that was done yesterday.  She has very mild tenderness on palpation but does not appear toxic with stable vitals and normal lab workup.  CT angiogram of the abdomen pelvis shows no acute abnormality at all.  Advise close follow-up with GI and primary care.  Given strict return precautions.  She appears well and feels comfortable with plan to be discharged home.  All results and plan discussed with patient and her family bedside.  Problems Addressed: Left flank pain: undiagnosed new problem with uncertain prognosis  Amount and/or Complexity of Data Reviewed External Data Reviewed: notes.    Details: Prior outpatient records reviewed and patient had a CT scan performed yesterday that showed mesenteric twisting Labs: ordered. Decision-making details documented in ED Course.    Details: Ordered and reviewed by me and unremarkable, no leukocytosis or lactic acid elevation Radiology: ordered and independent interpretation performed. Decision-making details documented in ED Course.    Details: Ordered and reviewed by me CT angiogram of the abdomen and pelvis shows no acute abnormality in the abdomen  Risk OTC drugs. Prescription drug management.     Final diagnoses:  Left flank pain    ED Discharge Orders     None          Gennaro Duwaine CROME, DO 11/22/23 1937

## 2023-11-22 NOTE — ED Triage Notes (Signed)
 Pt via POV c/o left flank pain intermittent x 2 weeks after colonoscopy. Pt was treated for UTI recently but pain persists. No prior hx kidney stones or pyelo. Pt had CT scan yesterday and shows that her bowel is twisted so she was advised to come to ER for eval. LBM was ribbonlike and small.

## 2023-11-22 NOTE — Telephone Encounter (Signed)
 Please call patient Julie Osborne CT abdomen does not show evidence of bowel blockage.  It does mention mild mesenteric twisting .   Mesenteric twisting can lead to a volvulus, which can cause the blood supply to be cut off from that portion and then the bowel will become ischemic you could die off. The area of the reported twisting is also the location she is feeling the pain. This can become an emergent situation if that is the case.  I would recommend she go to the emergency room for further evaluation.  They will need to perform a additional test that assesses the blood flow to the mesentery to make sure there is no volvulus occurring.  And if so .then they need to get gastroenterology and surgery involved.

## 2023-11-22 NOTE — Discharge Instructions (Addendum)
 Call and follow-up with your GI doctor.  You can follow-up at your appointment later in the month or see if they can schedule it for earlier.  Return to the ER for any new or worsening symptoms.

## 2023-11-22 NOTE — ED Notes (Signed)
 Patient transported to CT

## 2023-11-29 ENCOUNTER — Other Ambulatory Visit: Payer: Self-pay | Admitting: Physician Assistant

## 2023-11-29 ENCOUNTER — Encounter: Payer: Self-pay | Admitting: Physician Assistant

## 2023-11-29 ENCOUNTER — Ambulatory Visit (INDEPENDENT_AMBULATORY_CARE_PROVIDER_SITE_OTHER)
Admission: RE | Admit: 2023-11-29 | Discharge: 2023-11-29 | Disposition: A | Discharge status: Home / Self Care | Source: Ambulatory Visit | Attending: Physician Assistant | Admitting: Physician Assistant

## 2023-11-29 ENCOUNTER — Other Ambulatory Visit (INDEPENDENT_AMBULATORY_CARE_PROVIDER_SITE_OTHER)

## 2023-11-29 ENCOUNTER — Ambulatory Visit: Payer: Self-pay | Admitting: Physician Assistant

## 2023-11-29 ENCOUNTER — Ambulatory Visit: Admitting: Physician Assistant

## 2023-11-29 ENCOUNTER — Other Ambulatory Visit: Payer: Self-pay

## 2023-11-29 VITALS — BP 140/80 | HR 73 | Ht 69.5 in | Wt 218.0 lb

## 2023-11-29 DIAGNOSIS — I2510 Atherosclerotic heart disease of native coronary artery without angina pectoris: Secondary | ICD-10-CM

## 2023-11-29 DIAGNOSIS — E785 Hyperlipidemia, unspecified: Secondary | ICD-10-CM | POA: Diagnosis not present

## 2023-11-29 DIAGNOSIS — R109 Unspecified abdominal pain: Secondary | ICD-10-CM

## 2023-11-29 DIAGNOSIS — K5904 Chronic idiopathic constipation: Secondary | ICD-10-CM

## 2023-11-29 DIAGNOSIS — E1169 Type 2 diabetes mellitus with other specified complication: Secondary | ICD-10-CM | POA: Diagnosis not present

## 2023-11-29 DIAGNOSIS — Z860101 Personal history of adenomatous and serrated colon polyps: Secondary | ICD-10-CM

## 2023-11-29 DIAGNOSIS — R7989 Other specified abnormal findings of blood chemistry: Secondary | ICD-10-CM

## 2023-11-29 DIAGNOSIS — Z9861 Coronary angioplasty status: Secondary | ICD-10-CM

## 2023-11-29 LAB — COMPREHENSIVE METABOLIC PANEL WITH GFR
ALT: 44 U/L — ABNORMAL HIGH (ref 0–35)
AST: 26 U/L (ref 0–37)
Albumin: 4.3 g/dL (ref 3.5–5.2)
Alkaline Phosphatase: 80 U/L (ref 39–117)
BUN: 13 mg/dL (ref 6–23)
CO2: 31 meq/L (ref 19–32)
Calcium: 9.2 mg/dL (ref 8.4–10.5)
Chloride: 100 meq/L (ref 96–112)
Creatinine, Ser: 0.54 mg/dL (ref 0.40–1.20)
GFR: 92.9 mL/min (ref >60.00)
Glucose, Bld: 137 mg/dL — ABNORMAL HIGH (ref 70–99)
Potassium: 3.8 meq/L (ref 3.5–5.1)
Sodium: 139 meq/L (ref 135–145)
Total Bilirubin: 0.5 mg/dL (ref 0.2–1.2)
Total Protein: 7 g/dL (ref 6.0–8.3)

## 2023-11-29 LAB — CBC WITH DIFFERENTIAL/PLATELET
Basophils Absolute: 0 K/uL (ref 0.0–0.1)
Basophils Relative: 0.4 % (ref 0.0–3.0)
Eosinophils Absolute: 0.1 K/uL (ref 0.0–0.7)
Eosinophils Relative: 1.1 % (ref 0.0–5.0)
HCT: 43.1 % (ref 36.0–46.0)
Hemoglobin: 14.5 g/dL (ref 12.0–15.0)
Lymphocytes Relative: 36.6 % (ref 12.0–46.0)
Lymphs Abs: 2.3 K/uL (ref 0.7–4.0)
MCHC: 33.5 g/dL (ref 30.0–36.0)
MCV: 92.3 fl (ref 78.0–100.0)
Monocytes Absolute: 0.4 K/uL (ref 0.1–1.0)
Monocytes Relative: 6.9 % (ref 3.0–12.0)
Neutro Abs: 3.5 K/uL (ref 1.4–7.7)
Neutrophils Relative %: 55 % (ref 43.0–77.0)
Platelets: 239 K/uL (ref 150.0–400.0)
RBC: 4.67 Mil/uL (ref 3.87–5.11)
RDW: 13.3 % (ref 11.5–15.5)
WBC: 6.3 K/uL (ref 4.0–10.5)

## 2023-11-29 LAB — SEDIMENTATION RATE: Sed Rate: 11 mm/h (ref 0–30)

## 2023-11-29 MED ORDER — LINACLOTIDE 145 MCG PO CAPS: 145.0000 ug | ORAL_CAPSULE | Freq: Every day | ORAL | Status: DC

## 2023-11-29 MED ORDER — HYOSCYAMINE SULFATE 0.125 MG PO TABS: 0.1250 mg | ORAL_TABLET | Freq: Four times a day (QID) | ORAL | 0 refills | Status: AC | PRN

## 2023-11-29 MED ORDER — DICYCLOMINE HCL 20 MG PO TABS: 20.0000 mg | ORAL_TABLET | Freq: Three times a day (TID) | ORAL | 0 refills | Status: AC | PRN

## 2023-11-29 MED ORDER — LINACLOTIDE 290 MCG PO CAPS: 290.0000 ug | ORAL_CAPSULE | Freq: Every day | ORAL | Status: DC

## 2023-11-29 NOTE — Patient Instructions (Addendum)
 Your provider has requested that you have an abdominal x ray before leaving today. Please go to the basement floor to our Radiology department for the test.  Your provider has requested that you go to the basement level for lab work before leaving today. Press B on the elevator. The lab is located at the first door on the left as you exit the elevator.   [address]  145 and 290, can take AFTER Xray results, let us  know which one works *IBS-C patients may begin to experience relief from belly pain and overall abdominal symptoms (pain, discomfort, and bloating) in about 1 week,  with symptoms typically improving over 12 weeks.  Take at least 30 minutes before the first meal of the day on an empty stomach You can have a loose stool if you eat a high-fat breakfast. Give it at least 7 days, may have more bowel movements during that time.   The diarrhea should go away and you should start having normal, complete, full bowel movements.  It may be helpful to start treatment when you can be near the comfort of your own bathroom, such as a weekend.  After you are out we can send in a prescription if you did well, there is a prescription card   First do a trial off milk/lactose products if you use them.  Add fiber like benefiber or citracel once a day Increase activity Can do trial of IBGard which is over the counter for AB pain- Take 1-2 capsules once a day for maintence or twice a day during a flare For IBS and peppermint oil.  Peppermint oil has been proven to be better than placebo for cramping for IBS Stop if it worsens heart burn or causes flushing of your face.  Ideally enteric coated peppermint oil capsules over the counter IBGard, can take 2 a day is best but if you got the oil, you can use 0.97ml or 180 mg of pepperment oil up to 3 x a day.   Can send in an anti spasm medication, Levsin , to take as needed Please try to decrease stress. consider talking with PCP about anti anxiety medication or  try head space app for meditation. if any worsening symptoms like blood in stool, weight loss, please call the office     FODMAP stands for fermentable oligo-, di-, mono-saccharides and polyols (1). These are the scientific terms used to classify groups of carbs that are difficult for our body to digest and that are notorious for triggering digestive symptoms like bloating, gas, loose stools and stomach pain.   You can try low FODMAP diet  - start with eliminating just one column at a time that you feel may be a trigger for you. - the table at the very bottom contains foods that are low in FODMAPs   Sometimes trying to eliminate the FODMAP's from your diet is difficult or tricky, if you are stuggling with trying to do the elimination diet you can try an enzyme.  There is a food enzymes that you sprinkle in or on your food that helps break down the FODMAP. You can read more about the enzyme by going to this site: https://fodzyme.com/

## 2023-11-29 NOTE — Progress Notes (Signed)
 11/29/2023 Julie Osborne [id number] 11/13/1950  Referring provider: Catherine Charlies LABOR, DO Primary GI doctor: Dr. San  ASSESSMENT AND PLAN:  Intermittent left flank pain  possible mesenteric twisting on CT but that resolved CTA following day, had UTI s/p treatment Colonoscopy 07/29, 2-3 days after left upper flank pain with radiation around to her back, improves after BM, intermittent nausea, no vomiting, bland diet Abnormal imaging on CT AP 8/14 showing mild mesenteric twisting medial aspect of mid to lower left abdomen mild internal hernia Repeat CT angio 11/22/2023 showed no acute vascular abnormality, no acute intra-abdominal abnormality trace pleural effusions small HH and Roux-en-Y, Pancreas was diffusely atrophic no ductal dilation, liver and gallbladder unremarkable Likely splenic flexure syndrome, rule out complication from colonoscopy/mesenteric twisting, etc While we are working 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, civil (consulting) and unable to work, will plan on her being out of work until follow up in 2 weeks - KUB two view - CBC, CMET, and ESR -Pending labs/KUB, consider CT AB and pelvis if any concern for inflammation or obstruction but AB soft -Consider SIBO testing or xifaxin trial pending results - consider pelvic floor PT, information given -Can do trial of IBGARD daily, will give Bentyl  and Levsin  -FODMAP,  and lifestyle changes discussed  Chronic idiopathic constipation with significant looping, long standing history Has been on senokot, stool softener twice a day Has been on miralax daily for a month and still having intermittent bowel movements every 3 days but have been more formed, and feeling more complete - check CBC, CMET - check ESR/CRP -Will add on Linzess  after KUB  Personal history of adenomatous polyps Inadequate prep 06/2020 6 subcentimeter polyps 3 adenomatous 11/05/2023 2-day prep with inadequate prep and stool throughout the colon, significant looping 3 mm tubular  adenomatous polyp descending colon Due to incomplete prep plan for 1 year colonoscopy with 2-day prep and hopefully better treatment of constipation  History of Roux-en-Y bypass  CAD status post angioplasty  Type 2 diabetes with hyperlipidemia Lab Results  Component Value Date   HGBA1C 6.8 (H) 07/17/2023    Patient Care Team: Catherine Charlies LABOR, DO as PCP - General (Family Medicine) Gideon Velma Berg, DPM as Referring Physician (Podiatry) Harden Jerona GAILS, MD as Consulting Physician (Orthopedic Surgery) Dietels, Mal LABOR, OD (Optometry) Shona Layman BROCKS, MD as Referring Physician (Urology) Hyacinth Warren PARAS, FNP as Nurse Practitioner (Endocrinology)  HISTORY OF PRESENT ILLNESS: 73 y.o. female with a past medical history listed below presents for follow-up after colonoscopy 11/05/2023.  Discussed the use of AI scribe software for clinical note transcription with the patient, who gave verbal consent to proceed.  History of Present Illness   Julie Osborne is a 73 year old female with a history of Roux-en-Y gastric bypass surgery who presents with abdominal pain and bowel irregularities.  She has a long-standing history of constipation, which has worsened since her Roux-en-Y gastric bypass surgery in July 2024. She uses Senokot and stool softeners twice daily, and Miralax as needed, which she has been taking daily since her recent colonoscopy. Despite this regimen, she initially experienced incomplete bowel movements occurring once a week, but recently has had more regular, formed stools every three days.  She underwent a colonoscopy on November 05, 2023, which was preceded by a two-day prep that was still inadequate, resulting in stool throughout the colon. The procedure was complicated by looping in the colon. Post-procedure, she experienced typical post-colonoscopy discomfort, but the pain persisted and intensified, leading to an  ER visit where a CT angiogram suggested possible mesenteric  twisting. A repeat angiogram was performed the following day, but the pain has continued.  The abdominal pain began a few days post-colonoscopy and is described as cramping, similar to menstrual cramps, located in the lower abdomen and radiating to the back. The pain is sometimes alleviated by bowel movements but returns shortly after. She also reports increased gas and bloating, which she manages with Gas-X, and has been experiencing nausea without vomiting. She has been on a bland diet to manage symptoms.  In addition to her gastrointestinal issues, she had a urinary tract infection shortly after the colonoscopy, which was treated with antibiotics. She continues on Bactrim  for prophylaxis. She has a history of kidney infections and was previously treated with multiple rounds of antibiotics for kidney-related infections.  Her bowel movements have improved slightly in frequency and form, but the abdominal pain persists, impacting her ability to work and perform daily activities. 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        She  reports that she quit smoking about 12 years ago. Her smoking use included cigarettes. She has never used smokeless tobacco. She reports that she does not drink alcohol and does not use drugs.  RELEVANT GI HISTORY, IMAGING AND LABS: Results   RADIOLOGY CT abdomen pelvis: Mesenteric twisting, concern for internal hernia (11/21/2023) CT angiogram: Repeat angiogram, mesenteric twisting improved, no internal hernia (11/22/2023)  DIAGNOSTIC Colonoscopy: Multiple polyps, three adenomatous polyps, inadequate bowel preparation, stool throughout colon (11/05/2023)      CBC    Component Value Date/Time   WBC 8.4 11/22/2023 1455   RBC 4.44 11/22/2023 1455   HGB 14.0 11/22/2023 1455   HCT 40.9 11/22/2023 1455   PLT 234 11/22/2023 1455   MCV 92.1 11/22/2023 1455   MCH 31.5 11/22/2023 1455   MCHC 34.2 11/22/2023  1455   RDW 12.6 11/22/2023 1455   LYMPHSABS 2.5 11/21/2023 1036   MONOABS 0.4 11/21/2023 1036   EOSABS 0.1 11/21/2023 1036   BASOSABS 0.0 11/21/2023 1036   Recent Labs    01/30/23 0000 04/13/23 1431 05/08/23 1151 11/21/23 1036 11/22/23 1455  HGB 14.0 12.9 13.9 13.9 14.0    CMP     Component Value Date/Time   NA 135 11/22/2023 1455   NA 141 01/30/2023 0000   K 4.2 11/22/2023 1455   CL 99 11/22/2023 1455   CO2 26 11/22/2023 1455   GLUCOSE 161 (H) 11/22/2023 1455   BUN 13 11/22/2023 1455   BUN 10 01/30/2023 0000   CREATININE 0.62 11/22/2023 1455   CREATININE 0.57 08/13/2016 0722   CALCIUM  9.4 11/22/2023 1455   PROT 6.4 11/21/2023 1036   ALBUMIN 4.2 11/21/2023 1036   AST 25 11/21/2023 1036   ALT 36 (H) 11/21/2023 1036   ALKPHOS 72 11/21/2023 1036   BILITOT 0.5 11/21/2023 1036   GFRNONAA >60 11/22/2023 1455      Latest Ref Rng & Units 11/21/2023   10:36 AM 04/13/2023    2:31 PM 01/30/2023   12:00 AM  Hepatic Function  Total Protein 6.0 - 8.3 g/dL 6.4  6.5    Albumin 3.5 - 5.2 g/dL 4.2  3.3  4.1      AST 0 - 37 U/L 25  18  20       ALT 0 - 35 U/L 36  17  19      Alk Phosphatase 39 -  117 U/L 72  83  8.4      Total Bilirubin 0.2 - 1.2 mg/dL 0.5  0.7       This result is from an external source.      Current Medications:   Current Outpatient Medications (Endocrine & Metabolic):    Insulin  Aspart FlexPen (NOVOLOG) 100 UNIT/ML, Inject 4 Units into the skin 3 (three) times daily. 4-12 units per sliding scale   insulin  degludec (TRESIBA) 200 UNIT/ML FlexTouch Pen, Inject 30 Units into the skin at bedtime.   tirzepatide (MOUNJARO) 5 MG/0.5ML Pen, Inject 5 mg into the skin once a week.   MOUNJARO 2.5 MG/0.5ML Pen, Inject 2.5 mg into the skin once a week.   TOUJEO  MAX SOLOSTAR 300 UNIT/ML Solostar Pen, Inject into the skin. 45 unit sub q PM (Patient not taking: Reported on 11/21/2023)  Current Outpatient Medications (Cardiovascular):    amLODipine  (NORVASC ) 5 MG tablet,  TAKE 1 TABLET (5 MG TOTAL) BY MOUTH DAILY.   metoprolol  succinate (TOPROL -XL) 50 MG 24 hr tablet, Take 1 tablet (50 mg total) by mouth daily.   nitroGLYCERIN (NITROSTAT) 0.4 MG SL tablet, Place under the tongue.    rosuvastatin  (CRESTOR ) 20 MG tablet, Take 1 tablet (20 mg total) by mouth daily.   valsartan  (DIOVAN ) 320 MG tablet, Take 1 tablet (320 mg total) by mouth daily.  Current Outpatient Medications (Respiratory):    albuterol  (VENTOLIN  HFA) 108 (90 Base) MCG/ACT inhaler, Inhale 1-2 puffs into the lungs every 6 (six) hours as needed for wheezing or shortness of breath.   fluticasone  (FLONASE ) 50 MCG/ACT nasal spray, Place 2 sprays into both nostrils daily.  Current Outpatient Medications (Analgesics):    aspirin EC 81 MG tablet, Take 81 mg by mouth daily.   Current Outpatient Medications (Other):    dicyclomine  (BENTYL ) 20 MG tablet, Take 1 tablet (20 mg total) by mouth 3 (three) times daily as needed for spasms (can take before pain).   Docusate Sodium 150 MG/15ML syrup, Take 50 mg by mouth 2 (two) times daily.   estradiol  (ESTRACE ) 0.1 MG/GM vaginal cream, Apply pea sized amount on fingertip 3x weekly   hyoscyamine  (LEVSIN ) 0.125 MG tablet, Take 1 tablet (0.125 mg total) by mouth every 6 (six) hours as needed for cramping (Acute spasm).   Multiple Vitamin (MULTIVITAMIN) capsule, Take 1 capsule by mouth daily.    nitrofurantoin , macrocrystal-monohydrate, (MACROBID ) 100 MG capsule, Take 1 capsule (100 mg total) by mouth daily.   omeprazole (PRILOSEC) 20 MG capsule, Take 20 mg by mouth daily.   ondansetron  (ZOFRAN ) 8 MG tablet, Take 8 mg by mouth every 8 (eight) hours as needed for nausea or vomiting.   senna-docusate (SENOKOT-S) 8.6-50 MG tablet, Take 1 tablet by mouth 2 (two) times daily.   tiZANidine  (ZANAFLEX ) 4 MG tablet, Take 1 tablet (4 mg total) by mouth every 6 (six) hours as needed for muscle spasms.   Vitamin D , Ergocalciferol , (DRISDOL) 1.25 MG (50000 UNIT) CAPS capsule,  Take 50,000 Units by mouth once a week.   sulfamethoxazole -trimethoprim  (BACTRIM ) 400-80 MG tablet, Take 1 tablet by mouth at bedtime.  Medical History:  Past Medical History:  Diagnosis Date   Apnea    Closed fracture of neck of left radius 10/14/2017   Concussion    Degenerative arthritis    generalized/back/LEFT foot   Diabetic neuropathy (HCC) 2017   Diverticulosis 06/19/2013   sigmoid colon on colonoscopy   E-coli UTI 04/17/2023   Hyperlipidemia 2000   on meds   Hypertension,  benign 1996   on meds   Lumbar radiculopathy    Severe L4/L5 radiculopathy by old records.    OSA (obstructive sleep apnea)    Pes planus 12/17/2011   Posterior tibial tendon dysfunction (PTTD) of left lower extremity 04/10/2015   post tibial tendon dysfunction stage 5 (PTTD)   Pyelonephritis 04/17/2023   Situational anxiety 03/30/2020   Tenosynovitis of ankle 2017   post tibial tendon dysfunction stage 5 (PTTD)   Uncontrolled diabetes mellitus 2005   on meds   Allergies:  Allergies  Allergen Reactions   Statins Other (See Comments)    Lipitor,simvatatin, pravastatin.welchol cause myalgia     Surgical History:  She  has a past surgical history that includes Minor hemorrhoidectomy (2006); Exploratory laparotomy (1976); OTHER SURGICAL HISTORY (Right, 1975); Coronary angioplasty with stent (10/17/2018); and Gastric bypass (2024). Family History:  Her family history includes COPD in her mother; Cancer in her father; Dementia in her maternal grandmother; Diabetes in her brother and sister; Diabetes Mellitus II in her mother; Heart disease in her maternal grandfather and maternal grandmother; Heart failure in her maternal aunt and mother; Lung cancer in her paternal aunt and paternal grandfather; Osteoarthritis in her father; Stroke in her maternal grandfather.  REVIEW OF SYSTEMS  : All other systems reviewed and negative except where noted in the History of Present Illness.  PHYSICAL EXAM: BP (!)  140/80   Pulse 73   Ht 5' 9.5 (1.765 m)   Wt 218 lb (98.9 kg)   BMI 31.73 kg/m  Physical Exam   GENERAL APPEARANCE: Well nourished, in no apparent distress. HEENT: No cervical lymphadenopathy, unremarkable thyroid , sclerae anicteric, conjunctiva pink. RESPIRATORY: Respiratory effort normal, breath sounds equal bilaterally without rales, rhonchi, or wheezing. CARDIO: Regular rate and rhythm with no murmurs, rubs, or gallops, peripheral pulses intact. ABDOMEN: Soft, non-distended, active bowel sounds in all four quadrants, tenderness in left lower quadrant, no rebound, no mass appreciated. RECTAL: Declines. MUSCULOSKELETAL: Full range of motion, normal gait, without edema. SKIN: Dry, intact without rashes or lesions. No jaundice. NEURO: Alert, oriented, no focal deficits. PSYCH: Cooperative, normal mood and affect.      Alan JONELLE Coombs, PA-C 11:40 AM

## 2023-11-29 NOTE — Progress Notes (Unsigned)
 Julie Osborne

## 2023-12-02 ENCOUNTER — Ambulatory Visit: Admitting: Urology

## 2023-12-05 ENCOUNTER — Ambulatory Visit: Admitting: Physician Assistant

## 2023-12-06 ENCOUNTER — Ambulatory Visit (HOSPITAL_BASED_OUTPATIENT_CLINIC_OR_DEPARTMENT_OTHER)
Admission: RE | Admit: 2023-12-06 | Discharge: 2023-12-06 | Disposition: A | Discharge status: Home / Self Care | Source: Ambulatory Visit | Attending: Physician Assistant | Admitting: Physician Assistant

## 2023-12-06 DIAGNOSIS — R7989 Other specified abnormal findings of blood chemistry: Secondary | ICD-10-CM | POA: Insufficient documentation

## 2023-12-12 ENCOUNTER — Ambulatory Visit: Payer: Self-pay | Admitting: Physician Assistant

## 2023-12-12 NOTE — Progress Notes (Unsigned)
 12/13/2023 Julie Osborne [id number] [birth year]  Referring provider: Catherine Charlies LABOR, DO Primary GI doctor: Dr. San  ASSESSMENT AND PLAN:  Intermittent left flank pain  possible mesenteric twisting on CT but that resolved CTA following day, had UTI s/p treatment Colonoscopy 10/2923, 2-3 days after left upper flank pain with radiation around to her back, improves after BM, intermittent nausea, no vomiting, bland diet Abnormal imaging on CT AP 11/21/23 showing mild mesenteric twisting medial aspect of mid to lower left abdomen mild internal hernia Repeat CT angio 11/22/2023 showed no acute vascular abnormality, no acute intra-abdominal abnormality trace pleural effusions small HH and Roux-en-Y, Pancreas was diffusely atrophic no ductal dilation, liver and gallbladder unremarkable Likely splenic flexure syndrome, rule out complication from colonoscopy/mesenteric twisting, etc 11/29/2023 KUB moderate stool burden, no obstruction 11/26/2023 AB US  was unremarkable Labs show slightly elevated ALT CBC without infection sed rate negative Trial of linzess  causes severe diarrhea but did help with the flank pain on the left, continues to be on linzess  290 with miralax at night  she states had a fall with right pain found to have a compression fracture on 08/31 - continue linzess  145 mcg daily with miralax at night - will extend FMLA another 2 weeks due to pain, can use salon pas patches and follow up with PCP.  -Consider SIBO testing or xifaxin trial pending results - consider pelvic floor PT, information given -Can do trial of IBGARD daily, will give Bentyl  and Levsin  -FODMAP,  and lifestyle changes discussed  Chronic idiopathic constipation with significant looping, long standing history - continue linzess  145 mcg daily with miralax at night - will extend FMLA another 2 weeks due to pain, can use salon pas patches and follow up with PCP.  -Consider SIBO testing or xifaxin trial pending  results - consider pelvic floor PT, information given -Can do trial of IBGARD daily, will give Bentyl  and Levsin  -FODMAP,  and lifestyle changes discussed  Personal history of adenomatous polyps Inadequate prep 06/2020 6 subcentimeter polyps 3 adenomatous 11/05/2023 2-day prep with inadequate prep and stool throughout the colon, significant looping 3 mm tubular adenomatous polyp descending colon Due to incomplete prep plan for 1 year colonoscopy with 2-day prep and hopefully better treatment of constipation  History of Roux-en-Y bypass  CAD status post angioplasty  Type 2 diabetes with hyperlipidemia Lab Results  Component Value Date   HGBA1C 6.8 (H) 07/17/2023    Patient Care Team: Catherine Charlies LABOR, DO as PCP - General (Family Medicine) Gideon Velma Berg, DPM as Referring Physician (Podiatry) Harden Jerona GAILS, MD as Consulting Physician (Orthopedic Surgery) Dietels, Mal LABOR, OD (Optometry) Shona Layman BROCKS, MD as Referring Physician (Urology) Hyacinth Warren PARAS, FNP as Nurse Practitioner (Endocrinology)  HISTORY OF PRESENT ILLNESS: 73 y.o. female with a past medical history listed below presents for follow-up after colonoscopy 11/05/2023.  Discussed the use of AI scribe software for clinical note transcription with the patient, who gave verbal consent to proceed.  History of Present Illness   Julie Osborne is a 73 year old female who presents with gastrointestinal issues and severe diarrhea after starting Linzess .  She began experiencing diarrhea on the third day after starting Linzess , which initially was mild but progressed to severe diarrhea the following day. This episode was so intense that she became dizzy, fell, and was unable to get up due to lightheadedness. She was home alone at the time, and her husband found her in this state upon returning from work.  Following the severe diarrhea, she experienced relief from flank pain, which she attributed to a 'clean out'  effect. However, she continued to experience bloating and gas pain, which she is trying to differentiate from other types of pain. Her bowel movements have stabilized with the use of Linzess  at a reduced dose of 145 mg, along with her usual regimen of Miralax. She reports improvement in bloating but continues to experience some gas pain. She is following a low FODMAP diet to manage her symptoms.  She developed cold symptoms, which she initially thought were side effects of Linzess , but they progressed into a full cold. She experienced congestion and was coughing up green sputum, prompting a visit to urgent care. She reports that urgent care performed an x-ray, told her there was no infection, but that there may be a compression fracture or bruise, and she continues to have significant pain with breathing and movement.  She is currently unable to work due to the pain from the suspected compression fracture, which affects her ability to perform tasks involving movement, pushing, and pulling.        She  reports that she quit smoking about 12 years ago. Her smoking use included cigarettes. She has never used smokeless tobacco. She reports that she does not drink alcohol and does not use drugs.  RELEVANT GI HISTORY, IMAGING AND LABS: Results   RADIOLOGY Chest X-ray: No signs of infection, possible compression fracture or contusion (12/08/2023)      CBC    Component Value Date/Time   WBC 6.3 11/29/2023 1126   RBC 4.67 11/29/2023 1126   HGB 14.5 11/29/2023 1126   HCT 43.1 11/29/2023 1126   PLT 239.0 11/29/2023 1126   MCV 92.3 11/29/2023 1126   MCH 31.5 11/22/2023 1455   MCHC 33.5 11/29/2023 1126   RDW 13.3 11/29/2023 1126   LYMPHSABS 2.3 11/29/2023 1126   MONOABS 0.4 11/29/2023 1126   EOSABS 0.1 11/29/2023 1126   BASOSABS 0.0 11/29/2023 1126   Recent Labs    01/30/23 0000 04/13/23 1431 05/08/23 1151 11/21/23 1036 11/22/23 1455 11/29/23 1126  HGB 14.0 12.9 13.9 13.9 14.0 14.5     CMP     Component Value Date/Time   NA 139 11/29/2023 1126   NA 141 01/30/2023 0000   K 3.8 11/29/2023 1126   CL 100 11/29/2023 1126   CO2 31 11/29/2023 1126   GLUCOSE 137 (H) 11/29/2023 1126   BUN 13 11/29/2023 1126   BUN 10 01/30/2023 0000   CREATININE 0.54 11/29/2023 1126   CREATININE 0.57 08/13/2016 0722   CALCIUM  9.2 11/29/2023 1126   PROT 7.0 11/29/2023 1126   ALBUMIN 4.3 11/29/2023 1126   AST 26 11/29/2023 1126   ALT 44 (H) 11/29/2023 1126   ALKPHOS 80 11/29/2023 1126   BILITOT 0.5 11/29/2023 1126   GFRNONAA >60 11/22/2023 1455      Latest Ref Rng & Units 11/29/2023   11:26 AM 11/21/2023   10:36 AM 04/13/2023    2:31 PM  Hepatic Function  Total Protein 6.0 - 8.3 g/dL 7.0  6.4  6.5   Albumin 3.5 - 5.2 g/dL 4.3  4.2  3.3   AST 0 - 37 U/L 26  25  18    ALT 0 - 35 U/L 44  36  17   Alk Phosphatase 39 - 117 U/L 80  72  83   Total Bilirubin 0.2 - 1.2 mg/dL 0.5  0.5  0.7       Current Medications:  Current Outpatient Medications (Endocrine & Metabolic):    Insulin  Aspart FlexPen (NOVOLOG) 100 UNIT/ML, Inject 4 Units into the skin 3 (three) times daily. 4-12 units per sliding scale   insulin  degludec (TRESIBA) 200 UNIT/ML FlexTouch Pen, Inject 30 Units into the skin at bedtime.  Current Outpatient Medications (Cardiovascular):    amLODipine  (NORVASC ) 5 MG tablet, TAKE 1 TABLET (5 MG TOTAL) BY MOUTH DAILY.   metoprolol  succinate (TOPROL -XL) 50 MG 24 hr tablet, Take 1 tablet (50 mg total) by mouth daily.   nitroGLYCERIN (NITROSTAT) 0.4 MG SL tablet, Place under the tongue.    rosuvastatin  (CRESTOR ) 20 MG tablet, Take 1 tablet (20 mg total) by mouth daily.   valsartan  (DIOVAN ) 320 MG tablet, Take 1 tablet (320 mg total) by mouth daily.  Current Outpatient Medications (Respiratory):    albuterol  (VENTOLIN  HFA) 108 (90 Base) MCG/ACT inhaler, Inhale 1-2 puffs into the lungs every 6 (six) hours as needed for wheezing or shortness of breath.   benzonatate  (TESSALON ) 100 MG  capsule, Take 200 mg by mouth 3 (three) times daily as needed.   fluticasone  (FLONASE ) 50 MCG/ACT nasal spray, Place 2 sprays into both nostrils daily.  Current Outpatient Medications (Analgesics):    aspirin EC 81 MG tablet, Take 81 mg by mouth daily.   Current Outpatient Medications (Other):    dicyclomine  (BENTYL ) 20 MG tablet, Take 1 tablet (20 mg total) by mouth 3 (three) times daily as needed for spasms (can take before pain).   Docusate Sodium 150 MG/15ML syrup, Take 50 mg by mouth 2 (two) times daily.   estradiol  (ESTRACE ) 0.1 MG/GM vaginal cream, Apply pea sized amount on fingertip 3x weekly   hyoscyamine  (LEVSIN ) 0.125 MG tablet, Take 1 tablet (0.125 mg total) by mouth every 6 (six) hours as needed for cramping (Acute spasm).   Multiple Vitamin (MULTIVITAMIN) capsule, Take 1 capsule by mouth daily.    nitrofurantoin , macrocrystal-monohydrate, (MACROBID ) 100 MG capsule, Take 1 capsule (100 mg total) by mouth daily.   omeprazole (PRILOSEC) 20 MG capsule, Take 20 mg by mouth daily.   ondansetron  (ZOFRAN ) 8 MG tablet, Take 8 mg by mouth every 8 (eight) hours as needed for nausea or vomiting.   ondansetron  (ZOFRAN -ODT) 8 MG disintegrating tablet, Take 8 mg by mouth every 8 (eight) hours as needed.   ONETOUCH VERIO test strip, 3 (three) times daily.   senna-docusate (SENOKOT-S) 8.6-50 MG tablet, Take 1 tablet by mouth 2 (two) times daily.   tiZANidine  (ZANAFLEX ) 4 MG tablet, Take 1 tablet (4 mg total) by mouth every 6 (six) hours as needed for muscle spasms.   Vitamin D , Ergocalciferol , (DRISDOL) 1.25 MG (50000 UNIT) CAPS capsule, Take 50,000 Units by mouth once a week.   linaclotide  (LINZESS ) 145 MCG CAPS capsule, Take 1 capsule (145 mcg total) by mouth daily before breakfast.  Medical History:  Past Medical History:  Diagnosis Date   Apnea    Closed fracture of neck of left radius 10/14/2017   Concussion    Degenerative arthritis    generalized/back/LEFT foot   Diabetic  neuropathy (HCC) 2017   Diverticulosis 06/19/2013   sigmoid colon on colonoscopy   E-coli UTI 04/17/2023   Hyperlipidemia 2000   on meds   Hypertension, benign 1996   on meds   Lumbar radiculopathy    Severe L4/L5 radiculopathy by old records.    OSA (obstructive sleep apnea)    Pes planus 12/17/2011   Posterior tibial tendon dysfunction (PTTD) of left lower extremity 04/10/2015  post tibial tendon dysfunction stage 5 (PTTD)   Pyelonephritis 04/17/2023   Situational anxiety 03/30/2020   Tenosynovitis of ankle 2017   post tibial tendon dysfunction stage 5 (PTTD)   Uncontrolled diabetes mellitus 2005   on meds   Allergies:  Allergies  Allergen Reactions   Statins Other (See Comments)    Lipitor,simvatatin, pravastatin.welchol cause myalgia     Surgical History:  She  has a past surgical history that includes Minor hemorrhoidectomy (2006); Exploratory laparotomy (1976); OTHER SURGICAL HISTORY (Right, 1975); Coronary angioplasty with stent (10/17/2018); and Gastric bypass (2024). Family History:  Her family history includes COPD in her mother; Cancer in her father; Dementia in her maternal grandmother; Diabetes in her brother and sister; Diabetes Mellitus II in her mother; Heart disease in her maternal grandfather and maternal grandmother; Heart failure in her maternal aunt and mother; Lung cancer in her paternal aunt and paternal grandfather; Osteoarthritis in her father; Stroke in her maternal grandfather.  REVIEW OF SYSTEMS  : All other systems reviewed and negative except where noted in the History of Present Illness.  PHYSICAL EXAM: BP 120/70 (BP Location: Left Arm, Patient Position: Sitting, Cuff Size: Large)   Pulse 76   Ht 5' 7.5 (1.715 m) Comment: height measured without shoes  Wt 216 lb 8 oz (98.2 kg)   BMI 33.41 kg/m  Physical Exam   GENERAL APPEARANCE: Well nourished, in no apparent distress HEENT: No cervical lymphadenopathy, unremarkable thyroid , sclerae  anicteric, conjunctiva pink RESPIRATORY: Respiratory effort normal, BS equal bilateral without rales, rhonchi, wheezing CARDIO: RRR with no MRGs, peripheral pulses intact ABDOMEN: Soft, non distended, active bowel sounds in all 4 quadrants, no tenderness to palpation, no rebound, no mass appreciated RECTAL: declines MUSCULOSKELETAL: Full ROM, normal gait, without edema SKIN: Dry, intact without rashes or lesions. No jaundice. NEURO: Alert, oriented, no focal deficits PSYCH: Cooperative, normal mood and affect.      Alan JONELLE Coombs, PA-C 11:01 AM

## 2023-12-13 ENCOUNTER — Ambulatory Visit: Admitting: Physician Assistant

## 2023-12-13 ENCOUNTER — Encounter: Payer: Self-pay | Admitting: Physician Assistant

## 2023-12-13 VITALS — BP 120/70 | HR 76 | Ht 67.5 in | Wt 216.5 lb

## 2023-12-13 DIAGNOSIS — Z860101 Personal history of adenomatous and serrated colon polyps: Secondary | ICD-10-CM | POA: Diagnosis not present

## 2023-12-13 DIAGNOSIS — R7989 Other specified abnormal findings of blood chemistry: Secondary | ICD-10-CM

## 2023-12-13 DIAGNOSIS — K5904 Chronic idiopathic constipation: Secondary | ICD-10-CM

## 2023-12-13 MED ORDER — LINACLOTIDE 145 MCG PO CAPS: 145.0000 ug | ORAL_CAPSULE | Freq: Every day | ORAL | 3 refills | Status: AC

## 2023-12-13 NOTE — Patient Instructions (Addendum)
 _______________________________________________________  If your blood pressure at your visit was 140/90 or greater, please contact your primary care physician to follow up on this.  _______________________________________________________  If you are age 71 or older, your body mass index should be between 23-30. Your Body mass index is 33.41 kg/m. If this is out of the aforementioned range listed, please consider follow up with your Primary Care Provider.  If you are age 56 or younger, your body mass index should be between 19-25. Your Body mass index is 33.41 kg/m. If this is out of the aformentioned range listed, please consider follow up with your Primary Care Provider.   ________________________________________________________  The [facility] providers would like to encourage you to use MYCHART to communicate with providers for non-urgent requests or questions.  Due to long hold times on the telephone, sending your provider a message by [facility] may be a faster and more efficient way to get a response.  Please allow 48 business hours for a response.  Please remember that this is for non-urgent requests.  _______________________________________________________  [facility] is using a team-based approach to care.  Your team is made up of your doctor and two to three APPS. Our APPS (Nurse Practitioners and Physician Assistants) work with your physician to ensure care continuity for you. They are fully qualified to address your health concerns and develop a treatment plan. They communicate directly with your gastroenterologist to care for you. Seeing the Advanced Practice Practitioners on your physician's team can help you by facilitating care more promptly, often allowing for earlier appointments, access to diagnostic testing, procedures, and other specialty referrals.    Do trial of salon pas patches  Call call formulary and see if lysbeth is cheaper or what is on  formulary  Linzess   *IBS-C patients may begin to experience relief from belly pain and overall abdominal symptoms (pain, discomfort, and bloating) in about 1 week,  with symptoms typically improving over 12 weeks.  Take at least 30 minutes before the first meal of the day on an empty stomach You can have a loose stool if you eat a high-fat breakfast. Give it at least 7 days, may have more bowel movements during that time.   The diarrhea should go away and you should start having normal, complete, full bowel movements.  It may be helpful to start treatment when you can be near the comfort of your own bathroom, such as a weekend.  After you are out we can send in a prescription if you did well, there is a prescription card  Toileting tips to help with your constipation - Drink at least 64-80 ounces of water/liquid per day. - Establish a time to try to move your bowels every day.  For many people, this is after a cup of coffee or after a meal such as breakfast. - Sit all of the way back on the toilet keeping your back fairly straight and while sitting up, try to rest the tops of your forearms on your upper thighs.   - Raising your feet with a step stool/squatty potty can be helpful to improve the angle that allows your stool to pass through the rectum. - Relax the rectum feeling it bulge toward the toilet water.  If you feel your rectum raising toward your body, you are contracting rather than relaxing. - Breathe in and slowly exhale. Belly breath by expanding your belly towards your belly button. Keep belly expanded as you gently direct pressure down and back to the  anus.  A low pitched GRRR sound can assist with increasing intra-abdominal pressure.  (Can also trying to blow on a pinwheel and make it move, this helps with the same belly breathing) - Repeat 3-4 times. If unsuccessful, contract the pelvic floor to restore normal tone and get off the toilet.  Avoid excessive straining. - To reduce  excessive wiping by teaching your anus to normally contract, place hands on outer aspect of knees and resist knee movement outward.  Hold 5-10 second then place hands just inside of knees and resist inward movement of knees.  Hold 5 seconds.  Repeat a few times each way.  Go to the ER if unable to pass gas, severe AB pain, unable to hold down food, any shortness of breath of chest pain.   FODMAP stands for fermentable oligo-, di-, mono-saccharides and polyols (1). These are the scientific terms used to classify groups of carbs that are difficult for our body to digest and that are notorious for triggering digestive symptoms like bloating, gas, loose stools and stomach pain.   You can try low FODMAP diet  - start with eliminating just one column at a time that you feel may be a trigger for you. - the table at the very bottom contains foods that are low in FODMAPs   Sometimes trying to eliminate the FODMAP's from your diet is difficult or tricky, if you are stuggling with trying to do the elimination diet you can try an enzyme.  There is a food enzymes that you sprinkle in or on your food that helps break down the FODMAP. You can read more about the enzyme by going to this site: https://fodzyme.com/   Small intestinal bacterial overgrowth (SIBO) occurs when there is an abnormal increase in the overall bacterial population in the small intestine -- particularly types of bacteria not commonly found in that part of the digestive tract. Small intestinal bacterial overgrowth (SIBO) commonly results when a circumstance -- such as surgery or disease -- slows the passage of food and waste products in the digestive tract, creating a breeding ground for bacteria.  Signs and symptoms of SIBO often include: Loss of appetite Abdominal pain Nausea Bloating An uncomfortable feeling of fullness after eating Diarrhea or constipation, depending on the type of gas produced  What foods trigger SIBO? While  foods aren't the original cause of SIBO, certain foods do encourage the overgrowth of the wrong bacteria in your small intestine. If you're feeding them their favorite foods, they're going to grow more, and that will trigger more of your SIBO symptoms. By the same token, you can help reduce the overgrowth by starving the problematic bacteria of their favorite foods. This strategy has led to a number of proposed SIBO eating plans. The plans vary, and so do individual results. But in general, they tend to recommend limiting carbohydrates.  These include: Sugars and sweeteners. Fruits and starchy vegetables. Dairy products. Grains.  There is a test for this we can do called a breath test, if you are positive we will treat you with an antibiotic to see if it helps.  Your symptoms are very suspicious for this condition, as discussed, we will start you on an antibiotic to see if this helps.   Thank you for entrusting me with your care and choosing [facility].  Alan Coombs, PA-C

## 2023-12-16 ENCOUNTER — Ambulatory Visit: Admitting: Urology

## 2023-12-16 NOTE — Progress Notes (Deleted)
 Assessment: 1. Recurrent UTI     Plan: Continue methods to reduce the risk of UTIs including increase fluid intake, timed and double voiding, daily cranberry supplement, daily probiotic, and vaginal hormone cream. She would like to continue with the low-dose daily antibiotic at this time.  Refill sent. Return to office in approximately 2 months.    Chief Complaint: No chief complaint on file.   HPI: Julie Osborne is a 73 y.o. female who presents for continued evaluation of recurrent UTI. She was previously evaluated by Dr. Shona in February 2025 with history as noted below:  Briefly the patient has a history of recurrent uncomplicated UTIs over the last several years but also a recent episode of pyelonephritis/urosepsis in December 2025. When I saw her in February she was started on UTI prevention measures including vaginal estrogen cream, urinary probiotic and was also started on low-dose Bactrim  suppression with plans to keep her on that for the next several months.  PCP also had ordered a follow-up CT scan which continued to show some perfusion defects within the right kidney consistent with her recent pyelonephritis.  Patient presents here today for follow-up.  She was recently seen in the emergency room following a fall and injured her knee. She is doing very well from a urologic standpoint.  She has had no recurrent UTI symptoms.  I had her come in last week for urinalysis which was entirely normal but did send a resolve MDX culture which showed low colony counts of Enterococcus.  At her visit in June 2025, she continued on daily low-dose Bactrim  for UTI prevention.  She was also using daily cranberry supplement and a daily probiotic as well as vaginal hormone cream.  No current UTI symptoms.  No dysuria or gross hematuria. Renal ultrasound from 09/04/2023 showed no abnormalities involving either kidney. CT imaging abdomen and pelvis with contrast from 11/21/2023 showed no renal or  ureteral calculi, and no evidence of obstruction. She discontinued the daily antibiotic in late July 2025.  She subsequently developed UTI symptoms. Urine culture from 11/06/2023 grew >100 K E. coli, resistant to Bactrim .  Treated with cefdinir . Her daily antibiotic prophylaxis was changed to Macrodantin  in August 2025.  Portions of the above documentation were copied from a prior visit for review purposes only.  Allergies: Allergies  Allergen Reactions   Statins Other (See Comments)    Lipitor,simvatatin, pravastatin.welchol cause myalgia    PMH: Past Medical History:  Diagnosis Date   Apnea    Closed fracture of neck of left radius 10/14/2017   Concussion    Degenerative arthritis    generalized/back/LEFT foot   Diabetic neuropathy (HCC) 2017   Diverticulosis 06/19/2013   sigmoid colon on colonoscopy   E-coli UTI 04/17/2023   Hyperlipidemia 2000   on meds   Hypertension, benign 1996   on meds   Lumbar radiculopathy    Severe L4/L5 radiculopathy by old records.    OSA (obstructive sleep apnea)    Pes planus 12/17/2011   Posterior tibial tendon dysfunction (PTTD) of left lower extremity 04/10/2015   post tibial tendon dysfunction stage 5 (PTTD)   Pyelonephritis 04/17/2023   Situational anxiety 03/30/2020   Tenosynovitis of ankle 2017   post tibial tendon dysfunction stage 5 (PTTD)   Uncontrolled diabetes mellitus 2005   on meds    PSH: Past Surgical History:  Procedure Laterality Date   CORONARY ANGIOPLASTY WITH STENT PLACEMENT  10/17/2018   DES x2/LAD   EXPLORATORY LAPAROTOMY  1976  endometriosis   GASTRIC BYPASS  2024   MINOR HEMORRHOIDECTOMY  2006   OTHER SURGICAL HISTORY Right 1975   venous ligation    SH: Social History   Tobacco Use   Smoking status: Former    Current packs/day: 0.00    Types: Cigarettes    Quit date: 04/10/2011    Years since quitting: 12.6   Smokeless tobacco: Never  Vaping Use   Vaping status: Never Used  Substance Use  Topics   Alcohol use: No   Drug use: No    ROS: Constitutional:  Negative for fever, chills, weight loss CV: Negative for chest pain, previous MI, hypertension Respiratory:  Negative for shortness of breath, wheezing, sleep apnea, frequent cough GI:  Negative for nausea, vomiting, bloody stool, GERD  PE: There were no vitals taken for this visit. GENERAL APPEARANCE:  Well appearing, well developed, well nourished, NAD HEENT:  Atraumatic, normocephalic, oropharynx clear NECK:  Supple without lymphadenopathy or thyromegaly ABDOMEN:  Soft, non-tender, no masses EXTREMITIES:  Moves all extremities well, without clubbing, cyanosis, or edema NEUROLOGIC:  Alert and oriented x 3, normal gait, CN II-XII grossly intact MENTAL STATUS:  appropriate BACK:  Non-tender to palpation, No CVAT SKIN:  Warm, dry, and intact   Results: U/A:

## 2023-12-19 NOTE — Progress Notes (Signed)
 Agree with the assessment and plan as outlined by Quentin Mulling, PA-C. ? ?Keron Neenan, DO, FACG ? ?

## 2024-01-01 ENCOUNTER — Ambulatory Visit: Admitting: Family Medicine

## 2024-01-01 ENCOUNTER — Encounter: Payer: Self-pay | Admitting: Family Medicine

## 2024-01-01 VITALS — BP 122/79 | HR 69 | Temp 98.2°F | Wt 216.6 lb

## 2024-01-01 DIAGNOSIS — Z23 Encounter for immunization: Secondary | ICD-10-CM | POA: Diagnosis not present

## 2024-01-01 DIAGNOSIS — E785 Hyperlipidemia, unspecified: Secondary | ICD-10-CM | POA: Diagnosis not present

## 2024-01-01 DIAGNOSIS — M150 Primary generalized (osteo)arthritis: Secondary | ICD-10-CM

## 2024-01-01 DIAGNOSIS — E782 Mixed hyperlipidemia: Secondary | ICD-10-CM | POA: Diagnosis not present

## 2024-01-01 DIAGNOSIS — R2689 Other abnormalities of gait and mobility: Secondary | ICD-10-CM

## 2024-01-01 DIAGNOSIS — M5416 Radiculopathy, lumbar region: Secondary | ICD-10-CM

## 2024-01-01 DIAGNOSIS — I10 Essential (primary) hypertension: Secondary | ICD-10-CM

## 2024-01-01 DIAGNOSIS — M48061 Spinal stenosis, lumbar region without neurogenic claudication: Secondary | ICD-10-CM

## 2024-01-01 DIAGNOSIS — M8589 Other specified disorders of bone density and structure, multiple sites: Secondary | ICD-10-CM

## 2024-01-01 DIAGNOSIS — Z9181 History of falling: Secondary | ICD-10-CM

## 2024-01-01 DIAGNOSIS — E1169 Type 2 diabetes mellitus with other specified complication: Secondary | ICD-10-CM | POA: Diagnosis not present

## 2024-01-01 DIAGNOSIS — R269 Unspecified abnormalities of gait and mobility: Secondary | ICD-10-CM

## 2024-01-01 DIAGNOSIS — I2510 Atherosclerotic heart disease of native coronary artery without angina pectoris: Secondary | ICD-10-CM

## 2024-01-01 DIAGNOSIS — Z1231 Encounter for screening mammogram for malignant neoplasm of breast: Secondary | ICD-10-CM

## 2024-01-01 LAB — POCT GLYCOSYLATED HEMOGLOBIN (HGB A1C)
HbA1c POC (<> result, manual entry): 6.7 % (ref 4.0–5.6)
HbA1c, POC (controlled diabetic range): 6.7 % (ref 0.0–7.0)
HbA1c, POC (prediabetic range): 6.7 % — AB (ref 5.7–6.4)
Hemoglobin A1C: 6.7 % — AB (ref 4.0–5.6)

## 2024-01-01 MED ORDER — TIZANIDINE HCL 4 MG PO TABS: 4.0000 mg | ORAL_TABLET | Freq: Four times a day (QID) | ORAL | 5 refills | Status: AC | PRN

## 2024-01-01 MED ORDER — VALSARTAN 320 MG PO TABS: 320.0000 mg | ORAL_TABLET | Freq: Every day | ORAL | 1 refills | Status: AC

## 2024-01-01 MED ORDER — METOPROLOL SUCCINATE ER 50 MG PO TB24: 50.0000 mg | ORAL_TABLET | Freq: Every day | ORAL | 1 refills | Status: AC

## 2024-01-01 MED ORDER — AMLODIPINE BESYLATE 5 MG PO TABS: 5.0000 mg | ORAL_TABLET | Freq: Every day | ORAL | 1 refills | Status: AC

## 2024-01-01 NOTE — Progress Notes (Unsigned)
 Julie Osborne , 06/04/1950, 73 y.o., female MRN: [id number] Patient Care Team    Relationship Specialty Notifications Start End  Catherine Charlies LABOR, DO PCP - General Family Medicine  01/13/16   Gideon Velma Berg, [place] Referring Physician Podiatry  12/24/19   Harden Jerona GAILS, MD Consulting Physician Orthopedic Surgery  08/16/20   Dietels, Mal LABOR, OD  Optometry  07/08/21   Shona Layman BROCKS, MD Referring Physician Urology  07/17/23   Hyacinth Warren PARAS, FNP Nurse Practitioner Endocrinology  07/17/23    Comment: [facility]  [address]  [place], [place] [address]  [phone]    Chief Complaint  Patient presents with   Hypertension   Hyperlipidemia    Pt is not fasting Chronic condition management Influenza vaccine     Subjective:Julie Osborne is a 73 y.o. female patient present for Chronic Conditions/illness Management  Medication reconciliation completed today All past medical history updated where appropriate  HTN/hyperlipidemia: Pt reports compliance with metoprolol  50 mg daily, Crestor , diovan  320 mg qd and amlodipine  5 mg daily.  Amlodipine  was increased last visit due to mildly above goal blood pressures. Prior meds; amlodipine  10, valsartan -hctz 10-320-25 Patient denies chest pain, shortness of breath, dizziness or lower extremity edema.    Patient reports she has noticed more frequent episodes of loss of balance with her gait abnormality secondary to arthritis in multiple joints and back.  She has an acquired left foot deformity that also contributes to her unsteady gait.  She reports she has had more incidences of near falls and a fall and would like referral to physical therapy to assist her with balance and strengthening      07/17/2023    8:09 AM 05/08/2023   11:10 AM 10/24/2022   10:37 AM 08/01/2022    8:41 AM 06/25/2022   11:33 AM  Depression screen PHQ 2/9  Decreased Interest 0 0 0 0 0  Down, Depressed, Hopeless 0  0 0 0 0  PHQ - 2 Score 0 0 0 0 0  Altered sleeping 0 0 0    Tired, decreased energy 0 0 0    Change in appetite 0 0 0    Feeling bad or failure about yourself  0 0 0    Trouble concentrating 0 0 0    Moving slowly or fidgety/restless 0 0 0    Suicidal thoughts 0 0 0    PHQ-9 Score 0 0 0    Difficult doing work/chores Not difficult at all Not difficult at all Not difficult at all      Allergies  Allergen Reactions   Statins Other (See Comments)    Lipitor,simvatatin, pravastatin.welchol cause myalgia   Social History   Social History Narrative   Married to [employer], 2 children.    BS, RN retired now Agricultural consultant.    Drinks caffeine, uses herbal remedies. Daily vitmain use.   Wears her seatbelt, smoke detector in the home.    exercises routinely.    She feels safe in her relationships.    Past Medical History:  Diagnosis Date   Apnea    Closed fracture of neck of left radius 10/14/2017   Concussion    Degenerative arthritis    generalized/back/LEFT foot   Diabetic neuropathy (HCC) 2017   Diverticulosis 06/19/2013   sigmoid colon on colonoscopy   E-coli UTI 04/17/2023   Hyperlipidemia 2000   on meds   Hypertension, benign 1996   on  meds   Lumbar radiculopathy    Severe L4/L5 radiculopathy by old records.    OSA (obstructive sleep apnea)    Pes planus 12/17/2011   Posterior tibial tendon dysfunction (PTTD) of left lower extremity 04/10/2015   post tibial tendon dysfunction stage 5 (PTTD)   Pyelonephritis 04/17/2023   Situational anxiety 03/30/2020   Tenosynovitis of ankle 2017   post tibial tendon dysfunction stage 5 (PTTD)   Uncontrolled diabetes mellitus 2005   on meds   Past Surgical History:  Procedure Laterality Date   CORONARY ANGIOPLASTY WITH STENT PLACEMENT  10/17/2018   DES x2/LAD   EXPLORATORY LAPAROTOMY  1976   endometriosis   GASTRIC BYPASS  2024   MINOR HEMORRHOIDECTOMY  2006   OTHER SURGICAL HISTORY Right 1975   venous ligation   Family History   Problem Relation Age of Onset   COPD Mother    Diabetes Mellitus II Mother    Heart failure Mother    Osteoarthritis Father    Cancer Father        tumor found behind xyphoid process   Diabetes Sister    Diabetes Brother    Dementia Maternal Grandmother    Heart disease Maternal Grandmother    Heart disease Maternal Grandfather    Stroke Maternal Grandfather    Heart failure Maternal Aunt    Lung cancer Paternal Grandfather    Lung cancer Paternal Aunt    Colon polyps Neg Hx    Colon cancer Neg Hx    Esophageal cancer Neg Hx    Stomach cancer Neg Hx    Rectal cancer Neg Hx    Allergies as of 01/01/2024       Reactions   Statins Other (See Comments)   Lipitor,simvatatin, pravastatin.welchol cause myalgia        Medication List        Accurate as of January 01, 2024  8:39 AM. If you have any questions, ask your nurse or doctor.          STOP taking these medications    ondansetron  8 MG tablet Commonly known as: ZOFRAN  Stopped by: Charlies Bellini   senna-docusate 8.6-50 MG tablet Commonly known as: Senokot-S Stopped by: Charlies Bellini       TAKE these medications    albuterol  108 (90 Base) MCG/ACT inhaler Commonly known as: VENTOLIN  HFA Inhale 1-2 puffs into the lungs every 6 (six) hours as needed for wheezing or shortness of breath.   amLODipine  5 MG tablet Commonly known as: NORVASC  Take 1 tablet (5 mg total) by mouth daily.   aspirin EC 81 MG tablet Take 81 mg by mouth daily.   dicyclomine  20 MG tablet Commonly known as: BENTYL  Take 1 tablet (20 mg total) by mouth 3 (three) times daily as needed for spasms (can take before pain).   Docusate Sodium 150 MG/15ML syrup Take 50 mg by mouth 2 (two) times daily.   estradiol  0.1 MG/GM vaginal cream Commonly known as: ESTRACE  Apply pea sized amount on fingertip 3x weekly   fluticasone  50 MCG/ACT nasal spray Commonly known as: FLONASE  Place 2 sprays into both nostrils daily.   hyoscyamine  0.125 MG  tablet Commonly known as: LEVSIN  Take 1 tablet (0.125 mg total) by mouth every 6 (six) hours as needed for cramping (Acute spasm).   Insulin  Aspart FlexPen 100 UNIT/ML Commonly known as: NOVOLOG Inject 4 Units into the skin 3 (three) times daily. 4-12 units per sliding scale   insulin  degludec 200 UNIT/ML FlexTouch Pen Commonly  known as: TRESIBA Inject 30 Units into the skin at bedtime.   linaclotide  145 MCG Caps capsule Commonly known as: Linzess  Take 1 capsule (145 mcg total) by mouth daily before breakfast.   metoprolol  succinate 50 MG 24 hr tablet Commonly known as: TOPROL -XL Take 1 tablet (50 mg total) by mouth daily.   multivitamin capsule Take 1 capsule by mouth daily.   nitrofurantoin  (macrocrystal-monohydrate) 100 MG capsule Commonly known as: MACROBID  Take 1 capsule (100 mg total) by mouth daily.   nitroGLYCERIN 0.4 MG SL tablet Commonly known as: NITROSTAT Place under the tongue.   omeprazole 20 MG capsule Commonly known as: PRILOSEC Take 20 mg by mouth daily.   ondansetron  8 MG disintegrating tablet Commonly known as: ZOFRAN -ODT Take 8 mg by mouth every 8 (eight) hours as needed.   OneTouch Verio test strip Generic drug: glucose blood 3 (three) times daily.   rosuvastatin  20 MG tablet Commonly known as: CRESTOR  Take 1 tablet (20 mg total) by mouth daily.   tiZANidine  4 MG tablet Commonly known as: ZANAFLEX  Take 1 tablet (4 mg total) by mouth every 6 (six) hours as needed for muscle spasms.   valsartan  320 MG tablet Commonly known as: DIOVAN  Take 1 tablet (320 mg total) by mouth daily.   Vitamin D  (Ergocalciferol ) 1.25 MG (50000 UNIT) Caps capsule Commonly known as: DRISDOL Take 50,000 Units by mouth once a week.        All past medical history, surgical history, allergies, family history, immunizations andmedications were updated in the EMR today and reviewed under the history and medication portions of their EMR.     ROS: Negative, with  the exception of above mentioned in HPI  Objective:  BP 122/79   Pulse 69   Temp 98.2 F (36.8 C)   Wt 216 lb 9.6 oz (98.2 kg)   SpO2 99%   BMI 33.42 kg/m  Body mass index is 33.42 kg/m. Physical Exam Vitals and nursing note reviewed.  Constitutional:      General: She is not in acute distress.    Appearance: Normal appearance. She is not ill-appearing, toxic-appearing or diaphoretic.  HENT:     Head: Normocephalic and atraumatic.  Eyes:     General: No scleral icterus.       Right eye: No discharge.        Left eye: No discharge.     Extraocular Movements: Extraocular movements intact.     Conjunctiva/sclera: Conjunctivae normal.     Pupils: Pupils are equal, round, and reactive to light.  Cardiovascular:     Rate and Rhythm: Normal rate and regular rhythm.     Heart sounds: Murmur heard.  Pulmonary:     Effort: Pulmonary effort is normal. No respiratory distress.     Breath sounds: Normal breath sounds. No wheezing, rhonchi or rales.  Musculoskeletal:     Cervical back: Neck supple.     Right lower leg: No edema.     Left lower leg: No edema.  Skin:    General: Skin is warm.     Findings: No rash.  Neurological:     Mental Status: She is alert and oriented to person, place, and time. Mental status is at baseline.     Motor: No weakness.     Gait: Gait abnormal.  Psychiatric:        Mood and Affect: Mood normal.        Behavior: Behavior normal.        Thought Content: Thought  content normal.        Judgment: Judgment normal.     Diabetic Foot Exam - Simple   Simple Foot Form Diabetic Foot exam was performed with the following findings: Yes 01/01/2024  8:27 AM  Visual Inspection No deformities, no ulcerations, no other skin breakdown bilaterally: Yes Sensation Testing Intact to touch and monofilament testing bilaterally: Yes Pulse Check Posterior Tibialis and Dorsalis pulse intact bilaterally: Yes Comments     No results found. No results  found. Results for orders placed or performed in visit on 01/01/24 (from the past 24 hours)  POCT glycosylated hemoglobin (Hb A1C)     Status: Abnormal   Collection Time: 01/01/24  8:25 AM  Result Value Ref Range   Hemoglobin A1C 6.7 (A) 4.0 - 5.6 %   HbA1c POC (<> result, manual entry) 6.7 4.0 - 5.6 %   HbA1c, POC (prediabetic range) 6.7 (A) 5.7 - 6.4 %   HbA1c, POC (controlled diabetic range) 6.7 0.0 - 7.0 %     Assessment/Plan: Julie Osborne is a 73 y.o. female present for OV for chronic condition management Hypertension, benign/HLD/coronary artery disease/allergic to statins/CAD S/P percutaneous coronary angioplasty/bradycardia stable Goal < 130/80 Continue valsartan  320 mg daily  Continue metoprolol  50 mg  Continue amlodipine  5mg  every day Dc'd ASA 81(bariatric) Statin allergy - low sodium, exercise.  - routine exercise encouraged.  Labs: Due next visit   Lumbar back pain with radiculopathy affecting lower extremity/Spinal stenosis, lumbar region, without neurogenic claudication Stable Continue Zanaflex  prescribed as needed- flares.  Prior meds: cymbalta   Osteopenia of multiple sites Continue fosamax  35 mg weekly.  Dexa rpt 2-3 yrs 2026 Vitamin D  due next visit  type 2 diabetes mellitus with hyperlipidemia (HCC) Now managed by endo. Last A1c 6.8 in April A1c collected today 6.7 Foot exam completed 12/28/2023 Eye exam: Completed 06/17/2023 Microalbumin: Collected 01/01/2024 GFR: UTD 11/2023  Osteoarthritis/balance/increased fall risk/gait abnormality Agreed to refer to [facility] for PT  Return in about 24 weeks (around 06/17/2024) for Routine chronic condition follow-up.   Reviewed expectations re: course of current medical issues. Discussed self-management of symptoms. Outlined signs and symptoms indicating need for more acute intervention. Patient verbalized understanding and all questions were answered. Patient received an After-Visit  Summary.  I personally spent a total of 51 minutes in the care of the patient today including preparing to see the patient, getting/reviewing separately obtained history, performing a medically appropriate exam/evaluation, counseling and educating, placing orders, referring and communicating with other health care professionals, documenting clinical information in the EHR, coordinating care, and managing multiple chronic conditions and new acute concern.   Orders Placed This Encounter  Procedures   MM 3D SCREENING MAMMOGRAM BILATERAL BREAST   Flu vaccine HIGH DOSE PF(Fluzone Trivalent)   Urine Microalbumin w/creat. ratio   POCT glycosylated hemoglobin (Hb A1C)   Meds ordered this encounter  Medications   amLODipine  (NORVASC ) 5 MG tablet    Sig: Take 1 tablet (5 mg total) by mouth daily.    Dispense:  90 tablet    Refill:  1   metoprolol  succinate (TOPROL -XL) 50 MG 24 hr tablet    Sig: Take 1 tablet (50 mg total) by mouth daily.    Dispense:  90 tablet    Refill:  1   valsartan  (DIOVAN ) 320 MG tablet    Sig: Take 1 tablet (320 mg total) by mouth daily.    Dispense:  90 tablet    Refill:  1   tiZANidine  (ZANAFLEX )  4 MG tablet    Sig: Take 1 tablet (4 mg total) by mouth every 6 (six) hours as needed for muscle spasms.    Dispense:  30 tablet    Refill:  5    Referral Orders  No referral(s) requested today     Note is dictated utilizing voice recognition software. Although note has been proof read prior to signing, occasional typographical errors still can be missed. If any questions arise, please do not hesitate to call for verification.   electronically signed by:  Charlies Bellini, DO  [facility] [place]

## 2024-01-01 NOTE — Patient Instructions (Addendum)
 Return in about 24 weeks (around 06/17/2024) for Routine chronic condition follow-up.        Great to see you today.  I have refilled the medication(s) we provide.   If labs were collected or images ordered, we will inform you of  results once we have received them and reviewed. We will contact you either by echart message, or telephone call.  Please give ample time to the testing facility, and our office to run,  receive and review results. Please do not call inquiring of results, even if you can see them in your chart. We will contact you as soon as we are able. If it has been over 1 week since the test was completed, and you have not yet heard from us , then please call us .    - echart message- for normal results that have been seen by the patient already.   - telephone call: abnormal results or if patient has not viewed results in their echart.  If a referral to a specialist was entered for you, please call us  in 2 weeks if you have not heard from the specialist office to schedule.

## 2024-01-02 ENCOUNTER — Ambulatory Visit
Admission: RE | Admit: 2024-01-02 | Discharge: 2024-01-02 | Disposition: A | Discharge status: Home / Self Care | Source: Ambulatory Visit | Attending: Family Medicine | Admitting: Family Medicine

## 2024-01-02 DIAGNOSIS — Z1231 Encounter for screening mammogram for malignant neoplasm of breast: Secondary | ICD-10-CM

## 2024-01-03 ENCOUNTER — Other Ambulatory Visit: Payer: Self-pay | Admitting: Medical Genetics

## 2024-01-07 ENCOUNTER — Ambulatory Visit: Payer: Self-pay | Admitting: Family Medicine

## 2024-01-21 ENCOUNTER — Ambulatory Visit

## 2024-01-27 ENCOUNTER — Ambulatory Visit (INDEPENDENT_AMBULATORY_CARE_PROVIDER_SITE_OTHER): Admitting: Urology

## 2024-01-27 ENCOUNTER — Encounter: Payer: Self-pay | Admitting: Urology

## 2024-01-27 VITALS — BP 172/85 | HR 73 | Ht 69.0 in | Wt 217.0 lb

## 2024-01-27 DIAGNOSIS — N390 Urinary tract infection, site not specified: Secondary | ICD-10-CM

## 2024-01-27 DIAGNOSIS — Z87440 Personal history of urinary (tract) infections: Secondary | ICD-10-CM | POA: Diagnosis not present

## 2024-01-27 DIAGNOSIS — N3941 Urge incontinence: Secondary | ICD-10-CM

## 2024-01-27 LAB — URINALYSIS, ROUTINE W REFLEX MICROSCOPIC
Bilirubin, UA: NEGATIVE
Glucose, UA: NEGATIVE
Ketones, UA: NEGATIVE
Leukocytes,UA: NEGATIVE
Nitrite, UA: NEGATIVE
Protein,UA: NEGATIVE
RBC, UA: NEGATIVE
Specific Gravity, UA: 1.01 (ref 1.005–1.030)
Urobilinogen, Ur: 0.2 mg/dL (ref 0.2–1.0)
pH, UA: 7 (ref 5.0–7.5)

## 2024-01-27 MED ORDER — NITROFURANTOIN MONOHYD MACRO 100 MG PO CAPS: 100.0000 mg | ORAL_CAPSULE | Freq: Every day | ORAL | 2 refills | Status: DC

## 2024-01-27 NOTE — Progress Notes (Signed)
 Assessment: 1. Recurrent UTI   2. Urge incontinence     Plan: Continue methods to reduce the risk of UTIs including increase fluid intake, timed and double voiding, daily cranberry supplement, daily probiotic, and vaginal hormone cream. She would like to continue with the low-dose daily nitrofurantoin  at this time.  Refill sent. I discussed options for management of her urge incontinence.  She would like to monitor her symptoms at this time. Return to office in 4 months  Chief Complaint: Chief Complaint  Patient presents with   Recurrent UTI    HPI: Julie Osborne is a 73 y.o. female who presents for continued evaluation of recurrent UTI. She was previously evaluated by Dr. Shona in February 2025 with history as noted below:  Briefly the patient has a history of recurrent uncomplicated UTIs over the last several years but also a recent episode of pyelonephritis/urosepsis in December 2025. When I saw her in February she was started on UTI prevention measures including vaginal estrogen cream, urinary probiotic and was also started on low-dose Bactrim  suppression with plans to keep her on that for the next several months.  PCP also had ordered a follow-up CT scan which continued to show some perfusion defects within the right kidney consistent with her recent pyelonephritis.  Patient presents here today for follow-up.  She was recently seen in the emergency room following a fall and injured her knee. She is doing very well from a urologic standpoint.  She has had no recurrent UTI symptoms.  I had her come in last week for urinalysis which was entirely normal but did send a resolve MDX culture which showed low colony counts of Enterococcus.  At her visit in June 2025, she continued on daily low-dose Bactrim  for UTI prevention.  She was also using daily cranberry supplement and a daily probiotic as well as vaginal hormone cream.  No current UTI symptoms.  No dysuria or gross hematuria. Renal  ultrasound from 09/04/2023 showed no renal mass or obstruction. CT abdomen and pelvis with contrast from 11/21/2023 showed no renal or ureteral calculi, no renal mass, and no evidence of obstruction.  She developed UTI symptoms in late July 2025.  Urine culture grew +100 K E. coli.  She was treated with cefdinir .  She was started on Macrobid  for UTI prevention in August 2025.  She returns today for follow-up.  She continues on daily Macrobid  for UTI prevention.  She is not having any current UTI symptoms.  She does report occasional urgency and urge incontinence.  This primarily occurs when she tries to postpone urination.  No dysuria or gross hematuria.   Portions of the above documentation were copied from a prior visit for review purposes only.  Allergies: Allergies  Allergen Reactions   Statins Other (See Comments)    Lipitor,simvatatin, pravastatin.welchol cause myalgia    PMH: Past Medical History:  Diagnosis Date   Apnea    Closed fracture of neck of left radius 10/14/2017   Concussion    Degenerative arthritis    generalized/back/LEFT foot   Diabetic neuropathy (HCC) 2017   Diverticulosis 06/19/2013   sigmoid colon on colonoscopy   E-coli UTI 04/17/2023   Hyperlipidemia 2000   on meds   Hypertension, benign 1996   on meds   Lumbar radiculopathy    Severe L4/L5 radiculopathy by old records.    OSA (obstructive sleep apnea)    Pes planus 12/17/2011   Posterior tibial tendon dysfunction (PTTD) of left lower extremity 04/10/2015  post tibial tendon dysfunction stage 5 (PTTD)   Pyelonephritis 04/17/2023   Situational anxiety 03/30/2020   Tenosynovitis of ankle 2017   post tibial tendon dysfunction stage 5 (PTTD)   Uncontrolled diabetes mellitus 2005   on meds    PSH: Past Surgical History:  Procedure Laterality Date   CORONARY ANGIOPLASTY WITH STENT PLACEMENT  10/17/2018   DES x2/LAD   EXPLORATORY LAPAROTOMY  1976   endometriosis   GASTRIC BYPASS  2024   MINOR  HEMORRHOIDECTOMY  2006   OTHER SURGICAL HISTORY Right 1975   venous ligation    SH: Social History   Tobacco Use   Smoking status: Former    Current packs/day: 0.00    Types: Cigarettes    Quit date: 04/10/2011    Years since quitting: 12.8   Smokeless tobacco: Never  Vaping Use   Vaping status: Never Used  Substance Use Topics   Alcohol use: No   Drug use: No    ROS: Constitutional:  Negative for fever, chills, weight loss CV: Negative for chest pain, previous MI, hypertension Respiratory:  Negative for shortness of breath, wheezing, sleep apnea, frequent cough GI:  Negative for nausea, vomiting, bloody stool, GERD  PE: BP (!) 172/85   Pulse 73   Ht 5' 9 (1.753 m)   Wt 217 lb (98.4 kg)   BMI 32.05 kg/m  GENERAL APPEARANCE:  Well appearing, well developed, well nourished, NAD HEENT:  Atraumatic, normocephalic, oropharynx clear NECK:  Supple without lymphadenopathy or thyromegaly ABDOMEN:  Soft, non-tender, no masses EXTREMITIES:  Moves all extremities well, without clubbing, cyanosis, or edema NEUROLOGIC:  Alert and oriented x 3, normal gait, CN II-XII grossly intact MENTAL STATUS:  appropriate BACK:  Non-tender to palpation, No CVAT SKIN:  Warm, dry, and intact   Results: U/A: Negative

## 2024-02-06 ENCOUNTER — Ambulatory Visit

## 2024-02-10 ENCOUNTER — Encounter: Payer: Self-pay | Admitting: Radiology

## 2024-02-16 ENCOUNTER — Other Ambulatory Visit: Payer: Self-pay | Admitting: Urology

## 2024-02-16 DIAGNOSIS — N390 Urinary tract infection, site not specified: Secondary | ICD-10-CM

## 2024-03-11 ENCOUNTER — Ambulatory Visit: Admitting: *Deleted

## 2024-03-11 VITALS — Ht 70.0 in | Wt 210.0 lb

## 2024-03-11 DIAGNOSIS — Z0000 Encounter for general adult medical examination without abnormal findings: Secondary | ICD-10-CM | POA: Diagnosis not present

## 2024-03-11 NOTE — Progress Notes (Signed)
 Chief Complaint  Patient presents with   Medicare Wellness     Subjective:   Julie Osborne is a 73 y.o. female who presents for a Medicare Annual Wellness Visit.  I connected with  Valera Vallas on 03/11/24 by a audio enabled telemedicine application and verified that I am speaking with the correct person using two identifiers.  Patient Location: Home  Provider Location: Home Office  Persons Participating in Visit: Patient.  I discussed the limitations of evaluation and management by telemedicine. The patient expressed understanding and agreed to proceed.   Vital Signs: Because this visit was a virtual/telehealth visit, some criteria may be missing or patient reported. Any vitals not documented were not able to be obtained and vitals that have been documented are patient reported.      Visit info / Clinical Intake: Medicare Wellness Visit Type:: Subsequent Annual Wellness Visit Persons participating in visit and providing information:: patient Medicare Wellness Visit Mode:: Telephone If telephone:: video declined Since this visit was completed virtually, some vitals may be partially provided or unavailable. Missing vitals are due to the limitations of the virtual format.: Unable to obtain vitals - no equipment If Telephone or Video please confirm:: I connected with patient using audio/video enable telemedicine. I verified patient identity with two identifiers, discussed telehealth limitations, and patient agreed to proceed. Patient Location:: home Provider Location:: home Interpreter Needed?: No Pre-visit prep was completed: no AWV questionnaire completed by patient prior to visit?: no Living arrangements:: lives with spouse/significant other Patient's Overall Health Status Rating: good Typical amount of pain: some Does pain affect daily life?: no Are you currently prescribed opioids?: no  Dietary Habits and Nutritional Risks How many meals a day?: 3 Eats fruit and  vegetables daily?: yes Most meals are obtained by: preparing own meals; eating out In the last 2 weeks, have you had any of the following?: none Diabetic:: (!) yes Any non-healing wounds?: no How often do you check your BS?: continuous glucose monitor Would you like to be referred to a Nutritionist or for Diabetic Management? : no  Functional Status Activities of Daily Living (to include ambulation/medication): Independent Ambulation: Independent Medication Administration: Independent Home Management (perform basic housework or laundry): Independent Manage your own finances?: yes Primary transportation is: driving Concerns about vision?: no *vision screening is required for WTM* Concerns about hearing?: no  Fall Screening Falls in the past year?: 0 Number of falls in past year: 0 Was there an injury with Fall?: 0 Fall Risk Category Calculator: 0 Patient Fall Risk Level: Low Fall Risk  Fall Risk Patient at Risk for Falls Due to: Impaired balance/gait; No Fall Risks Fall risk Follow up: Falls evaluation completed; Education provided; Falls prevention discussed  Home and Transportation Safety: All rugs have non-skid backing?: yes All stairs or steps have railings?: N/A, no stairs Grab bars in the bathtub or shower?: yes Have non-skid surface in bathtub or shower?: yes Good home lighting?: yes Regular seat belt use?: yes Hospital stays in the last year:: no  Cognitive Assessment Difficulty concentrating, remembering, or making decisions? : no Will 6CIT or Mini Cog be Completed: yes What year is it?: 0 points What month is it?: 0 points Give patient an address phrase to remember (5 components): Its very sunny outside today in December About what time is it?: 0 points Count backwards from 20 to 1: 0 points Say the months of the year in reverse: 0 points Repeat the address phrase from earlier: 0 points 6 CIT Score: 0  points  Advance Directives (For Healthcare) Does Patient  Have a Medical Advance Directive?: Yes Type of Advance Directive: Living will Copy of Living Will in Chart?: No - copy requested  Reviewed/Updated  Reviewed/Updated: Reviewed All (Medical, Surgical, Family, Medications, Allergies, Care Teams, Patient Goals); Surgical History; Family History; Medications; Allergies; Care Teams; Patient Goals; Medical History    Allergies (verified) Statins   Current Medications (verified) Outpatient Encounter Medications as of 03/11/2024  Medication Sig   albuterol  (VENTOLIN  HFA) 108 (90 Base) MCG/ACT inhaler Inhale 1-2 puffs into the lungs every 6 (six) hours as needed for wheezing or shortness of breath.   amLODipine  (NORVASC ) 5 MG tablet Take 1 tablet (5 mg total) by mouth daily.   aspirin EC 81 MG tablet Take 81 mg by mouth daily.   dicyclomine  (BENTYL ) 20 MG tablet Take 1 tablet (20 mg total) by mouth 3 (three) times daily as needed for spasms (can take before pain).   Docusate Sodium 150 MG/15ML syrup Take 50 mg by mouth 2 (two) times daily.   estradiol  (ESTRACE ) 0.1 MG/GM vaginal cream Apply pea sized amount on fingertip 3x weekly   fluticasone  (FLONASE ) 50 MCG/ACT nasal spray Place 2 sprays into both nostrils daily.   hyoscyamine  (LEVSIN ) 0.125 MG tablet Take 1 tablet (0.125 mg total) by mouth every 6 (six) hours as needed for cramping (Acute spasm).   Insulin  Aspart FlexPen (NOVOLOG) 100 UNIT/ML Inject 4 Units into the skin 3 (three) times daily. 4-12 units per sliding scale   insulin  degludec (TRESIBA) 200 UNIT/ML FlexTouch Pen Inject 30 Units into the skin at bedtime.   linaclotide  (LINZESS ) 145 MCG CAPS capsule Take 1 capsule (145 mcg total) by mouth daily before breakfast.   metoprolol  succinate (TOPROL -XL) 50 MG 24 hr tablet Take 1 tablet (50 mg total) by mouth daily.   Multiple Vitamin (MULTIVITAMIN) capsule Take 1 capsule by mouth daily.    nitrofurantoin , macrocrystal-monohydrate, (MACROBID ) 100 MG capsule TAKE 1 CAPSULE BY MOUTH EVERY DAY    nitroGLYCERIN (NITROSTAT) 0.4 MG SL tablet Place under the tongue.    omeprazole (PRILOSEC) 20 MG capsule Take 20 mg by mouth daily.   ondansetron  (ZOFRAN -ODT) 8 MG disintegrating tablet Take 8 mg by mouth every 8 (eight) hours as needed.   ONETOUCH VERIO test strip 3 (three) times daily.   rosuvastatin  (CRESTOR ) 20 MG tablet Take 1 tablet (20 mg total) by mouth daily.   tiZANidine  (ZANAFLEX ) 4 MG tablet Take 1 tablet (4 mg total) by mouth every 6 (six) hours as needed for muscle spasms.   valsartan  (DIOVAN ) 320 MG tablet Take 1 tablet (320 mg total) by mouth daily.   Vitamin D , Ergocalciferol , (DRISDOL) 1.25 MG (50000 UNIT) CAPS capsule Take 50,000 Units by mouth once a week.   No facility-administered encounter medications on file as of 03/11/2024.    History: Past Medical History:  Diagnosis Date   Apnea    Closed fracture of neck of left radius 10/14/2017   Concussion    Degenerative arthritis    generalized/back/LEFT foot   Diabetic neuropathy (HCC) 2017   Diverticulosis 06/19/2013   sigmoid colon on colonoscopy   E-coli UTI 04/17/2023   Hyperlipidemia 2000   on meds   Hypertension, benign 1996   on meds   Lumbar radiculopathy    Severe L4/L5 radiculopathy by old records.    OSA (obstructive sleep apnea)    Pes planus 12/17/2011   Posterior tibial tendon dysfunction (PTTD) of left lower extremity 04/10/2015   post tibial  tendon dysfunction stage 5 (PTTD)   Pyelonephritis 04/17/2023   Situational anxiety 03/30/2020   Tenosynovitis of ankle 2017   post tibial tendon dysfunction stage 5 (PTTD)   Uncontrolled diabetes mellitus 2005   on meds   Past Surgical History:  Procedure Laterality Date   CORONARY ANGIOPLASTY WITH STENT PLACEMENT  10/17/2018   DES x2/LAD   EXPLORATORY LAPAROTOMY  1976   endometriosis   GASTRIC BYPASS  2024   MINOR HEMORRHOIDECTOMY  2006   OTHER SURGICAL HISTORY Right 1975   venous ligation   Family History  Problem Relation Age of Onset    COPD Mother    Diabetes Mellitus II Mother    Heart failure Mother    Osteoarthritis Father    Cancer Father        tumor found behind xyphoid process   Diabetes Sister    Heart failure Maternal Aunt    Lung cancer Paternal Aunt    Dementia Maternal Grandmother    Heart disease Maternal Grandmother    Heart disease Maternal Grandfather    Stroke Maternal Grandfather    Lung cancer Paternal Grandfather    Diabetes Brother    Colon polyps Neg Hx    Colon cancer Neg Hx    Esophageal cancer Neg Hx    Stomach cancer Neg Hx    Rectal cancer Neg Hx    Breast cancer Neg Hx    Social History   Occupational History   Occupation: runner, broadcasting/film/video   Occupation: retired engineer, civil (consulting)  Tobacco Use   Smoking status: Former    Current packs/day: 0.00    Types: Cigarettes    Quit date: 04/10/2011    Years since quitting: 12.9   Smokeless tobacco: Never  Vaping Use   Vaping status: Never Used  Substance and Sexual Activity   Alcohol use: No   Drug use: No   Sexual activity: Yes    Partners: Male    Birth control/protection: None    Comment: Married   Tobacco Counseling Counseling given: Not Answered  SDOH Screenings   Food Insecurity: No Food Insecurity (03/11/2024)  Housing: Unknown (03/11/2024)  Transportation Needs: No Transportation Needs (03/11/2024)  Utilities: Not At Risk (03/11/2024)  Alcohol Screen: Low Risk  (07/08/2021)  Depression (PHQ2-9): Low Risk  (03/11/2024)  Financial Resource Strain: Low Risk  (02/04/2023)  Physical Activity: Insufficiently Active (03/11/2024)  Social Connections: Socially Integrated (03/11/2024)  Stress: No Stress Concern Present (03/11/2024)  Tobacco Use: Medium Risk (03/11/2024)  Health Literacy: Adequate Health Literacy (03/11/2024)   See flowsheets for full screening details  Depression Screen PHQ 2 & 9 Depression Scale- Over the past 2 weeks, how often have you been bothered by any of the following problems? Little interest or pleasure in doing things:  0 Feeling down, depressed, or hopeless (PHQ Adolescent also includes...irritable): 0 PHQ-2 Total Score: 0 Trouble falling or staying asleep, or sleeping too much: 1 Feeling tired or having little energy: 1 Poor appetite or overeating (PHQ Adolescent also includes...weight loss): 0 Feeling bad about yourself - or that you are a failure or have let yourself or your family down: 0 Trouble concentrating on things, such as reading the newspaper or watching television (PHQ Adolescent also includes...like school work): 0 Moving or speaking so slowly that other people could have noticed. Or the opposite - being so fidgety or restless that you have been moving around a lot more than usual: 0 Thoughts that you would be better off dead, or of hurting yourself in some  way: 0 PHQ-9 Total Score: 2 If you checked off any problems, how difficult have these problems made it for you to do your work, take care of things at home, or get along with other people?: Not difficult at all     Goals Addressed             This Visit's Progress    Monitor and Manage My Blood Sugar-Diabetes Type 2   On track    Timeframe:  Short-Term Goal Priority:  Medium Start Date:   07/08/20                          Expected End Date:         09/07/20             - enter blood sugar readings and medication or insulin  into daily log    Why is this important?   Checking your blood sugar at home helps to keep it from getting very high or very low.  Writing the results in a diary or log helps the doctor know how to care for you.  Your blood sugar log should have the time, date and the results.  Also, write down the amount of insulin  or other medicine that you take.  Other information, like what you ate, exercise done and how you were feeling, will also be helpful.     Notes:      Patient Stated   On track    Eat healthier , increase activity & continue to drink plenty of water     Patient Stated   On track    Diet and  education to get gastric sleeve      Patient Stated       Working on keeping blood sugar in check Loose weight             Objective:    Today's Vitals   03/11/24 1140  Weight: 210 lb (95.3 kg)  Height: 5' 10 (1.778 m)   Body mass index is 30.13 kg/m.  Hearing/Vision screen Hearing Screening - Comments:: No trouble hearing Vision Screening - Comments:: Dietl  Immunizations and Health Maintenance Health Maintenance  Topic Date Due   Diabetic kidney evaluation - Urine ACR  Never done   OPHTHALMOLOGY EXAM  06/16/2024   HEMOGLOBIN A1C  06/30/2024   Colonoscopy  11/04/2024   Diabetic kidney evaluation - eGFR measurement  11/28/2024   FOOT EXAM  12/31/2024   Mammogram  01/01/2025   Medicare Annual Wellness (AWV)  03/11/2025   DTaP/Tdap/Td (3 - Td or Tdap) 02/10/2030   Pneumococcal Vaccine: 50+ Years  Completed   Influenza Vaccine  Completed   Bone Density Scan  Completed   Zoster Vaccines- Shingrix   Completed   Meningococcal B Vaccine  Aged Out   COVID-19 Vaccine  Discontinued   Hepatitis C Screening  Discontinued        Assessment/Plan:  This is a routine wellness examination for Sadako.  Patient Care Team: Catherine Charlies LABOR, DO as PCP - General (Family Medicine) Gideon Velma Berg, DPM as Referring Physician (Podiatry) Harden Jerona GAILS, MD as Consulting Physician (Orthopedic Surgery) Dietels, Mal LABOR, OD (Optometry) Shona Layman BROCKS, MD as Referring Physician (Urology) Hyacinth Warren PARAS, FNP as Nurse Practitioner (Endocrinology)  I have personally reviewed and noted the following in the patient's chart:   Medical and social history Use of alcohol, tobacco or illicit drugs  Current medications and supplements including  opioid prescriptions. Functional ability and status Nutritional status Physical activity Advanced directives List of other physicians Hospitalizations, surgeries, and ER visits in previous 12 months Vitals Screenings to include cognitive,  depression, and falls Referrals and appointments  No orders of the defined types were placed in this encounter.  In addition, I have reviewed and discussed with patient certain preventive protocols, quality metrics, and best practice recommendations. A written personalized care plan for preventive services as well as general preventive health recommendations were provided to patient.   Aleja Yearwood, LPN   [id number]   Return in 1 year (on 03/11/2025).  After Visit Summary: (MyChart) Due to this being a telephonic visit, the after visit summary with patients personalized plan was offered to patient via MyChart   Nurse Notes:

## 2024-03-11 NOTE — Patient Instructions (Signed)
 Julie Osborne,  Thank you for taking the time for your Medicare Wellness Visit. I appreciate your continued commitment to your health goals. Please review the care plan we discussed, and feel free to reach out if I can assist you further.  Please note that Annual Wellness Visits do not include a physical exam. Some assessments may be limited, especially if the visit was conducted virtually. If needed, we may recommend an in-person follow-up with your provider.  Ongoing Care Seeing your primary care provider every 3 to 6 months helps us  monitor your health and provide consistent, personalized care.   Referrals If a referral was made during today's visit and you haven't received any updates within two weeks, please contact the referred provider directly to check on the status.  Recommended Screenings:  Health Maintenance  Topic Date Due   Yearly kidney health urinalysis for diabetes  Never done   Eye exam for diabetics  06/16/2024   Hemoglobin A1C  06/30/2024   Colon Cancer Screening  11/04/2024   Yearly kidney function blood test for diabetes  11/28/2024   Complete foot exam   12/31/2024   Breast Cancer Screening  01/01/2025   Medicare Annual Wellness Visit  03/11/2025   DTaP/Tdap/Td vaccine (3 - Td or Tdap) 02/10/2030   Pneumococcal Vaccine for age over 26  Completed   Flu Shot  Completed   Osteoporosis screening with Bone Density Scan  Completed   Zoster (Shingles) Vaccine  Completed   Meningitis B Vaccine  Aged Out   COVID-19 Vaccine  Discontinued   Hepatitis C Screening  Discontinued       03/11/2024   11:42 AM  Advanced Directives  Does Patient Have a Medical Advance Directive? Yes  Type of Advance Directive Living will    Vision: Annual vision screenings are recommended for early detection of glaucoma, cataracts, and diabetic retinopathy. These exams can also reveal signs of chronic conditions such as diabetes and high blood pressure.  Dental: Annual dental screenings help  detect early signs of oral cancer, gum disease, and other conditions linked to overall health, including heart disease and diabetes.  Please see the attached documents for additional preventive care recommendations.   Julie Osborne , Thank you for taking time to come for your Medicare Wellness Visit. I appreciate your ongoing commitment to your health goals. Please review the following plan we discussed and let me know if I can assist you in the future.   Screening recommendations/referrals: Colonoscopy:  Mammogram:  Bone Density:  Recommended yearly ophthalmology/optometry visit for glaucoma screening and checkup Recommended yearly dental visit for hygiene and checkup  Vaccinations: Influenza vaccine:  Pneumococcal vaccine:  Tdap vaccine:  Shingles vaccine:        Preventive Care 65 Years and Older, Female Preventive care refers to lifestyle choices and visits with your health care provider that can promote health and wellness. What does preventive care include? A yearly physical exam. This is also called an annual well check. Dental exams once or twice a year. Routine eye exams. Ask your health care provider how often you should have your eyes checked. Personal lifestyle choices, including: Daily care of your teeth and gums. Regular physical activity. Eating a healthy diet. Avoiding tobacco and drug use. Limiting alcohol use. Practicing safe sex. Taking low-dose aspirin every day. Taking vitamin and mineral supplements as recommended by your health care provider. What happens during an annual well check? The services and screenings done by your health care provider during your annual well  check will depend on your age, overall health, lifestyle risk factors, and family history of disease. Counseling  Your health care provider may ask you questions about your: Alcohol use. Tobacco use. Drug use. Emotional well-being. Home and relationship well-being. Sexual  activity. Eating habits. History of falls. Memory and ability to understand (cognition). Work and work astronomer. Reproductive health. Screening  You may have the following tests or measurements: Height, weight, and BMI. Blood pressure. Lipid and cholesterol levels. These may be checked every 5 years, or more frequently if you are over 69 years old. Skin check. Lung cancer screening. You may have this screening every year starting at age 98 if you have a 30-pack-year history of smoking and currently smoke or have quit within the past 15 years. Fecal occult blood test (FOBT) of the stool. You may have this test every year starting at age 6. Flexible sigmoidoscopy or colonoscopy. You may have a sigmoidoscopy every 5 years or a colonoscopy every 10 years starting at age 22. Hepatitis C blood test. Hepatitis B blood test. Sexually transmitted disease (STD) testing. Diabetes screening. This is done by checking your blood sugar (glucose) after you have not eaten for a while (fasting). You may have this done every 1-3 years. Bone density scan. This is done to screen for osteoporosis. You may have this done starting at age 77. Mammogram. This may be done every 1-2 years. Talk to your health care provider about how often you should have regular mammograms. Talk with your health care provider about your test results, treatment options, and if necessary, the need for more tests. Vaccines  Your health care provider may recommend certain vaccines, such as: Influenza vaccine. This is recommended every year. Tetanus, diphtheria, and acellular pertussis (Tdap, Td) vaccine. You may need a Td booster every 10 years. Zoster vaccine. You may need this after age 86. Pneumococcal 13-valent conjugate (PCV13) vaccine. One dose is recommended after age 56. Pneumococcal polysaccharide (PPSV23) vaccine. One dose is recommended after age 46. Talk to your health care provider about which screenings and vaccines  you need and how often you need them. This information is not intended to replace advice given to you by your health care provider. Make sure you discuss any questions you have with your health care provider. Document Released: 04/22/2015 Document Revised: 12/14/2015 Document Reviewed: 01/25/2015 Elsevier Interactive Patient Education  2017 [employer].  Fall Prevention in the Home Falls can cause injuries. They can happen to people of all ages. There are many things you can do to make your home safe and to help prevent falls. What can I do on the outside of my home? Regularly fix the edges of walkways and driveways and fix any cracks. Remove anything that might make you trip as you walk through a door, such as a raised step or threshold. Trim any bushes or trees on the path to your home. Use bright outdoor lighting. Clear any walking paths of anything that might make someone trip, such as rocks or tools. Regularly check to see if handrails are loose or broken. Make sure that both sides of any steps have handrails. Any raised decks and porches should have guardrails on the edges. Have any leaves, snow, or ice cleared regularly. Use sand or salt on walking paths during winter. Clean up any spills in your garage right away. This includes oil or grease spills. What can I do in the bathroom? Use night lights. Install grab bars by the toilet and in the tub  and shower. Do not use towel bars as grab bars. Use non-skid mats or decals in the tub or shower. If you need to sit down in the shower, use a plastic, non-slip stool. Keep the floor dry. Clean up any water that spills on the floor as soon as it happens. Remove soap buildup in the tub or shower regularly. Attach bath mats securely with double-sided non-slip rug tape. Do not have throw rugs and other things on the floor that can make you trip. What can I do in the bedroom? Use night lights. Make sure that you have a light by your bed that  is easy to reach. Do not use any sheets or blankets that are too big for your bed. They should not hang down onto the floor. Have a firm chair that has side arms. You can use this for support while you get dressed. Do not have throw rugs and other things on the floor that can make you trip. What can I do in the kitchen? Clean up any spills right away. Avoid walking on wet floors. Keep items that you use a lot in easy-to-reach places. If you need to reach something above you, use a strong step stool that has a grab bar. Keep electrical cords out of the way. Do not use floor polish or wax that makes floors slippery. If you must use wax, use non-skid floor wax. Do not have throw rugs and other things on the floor that can make you trip. What can I do with my stairs? Do not leave any items on the stairs. Make sure that there are handrails on both sides of the stairs and use them. Fix handrails that are broken or loose. Make sure that handrails are as long as the stairways. Check any carpeting to make sure that it is firmly attached to the stairs. Fix any carpet that is loose or worn. Avoid having throw rugs at the top or bottom of the stairs. If you do have throw rugs, attach them to the floor with carpet tape. Make sure that you have a light switch at the top of the stairs and the bottom of the stairs. If you do not have them, ask someone to add them for you. What else can I do to help prevent falls? Wear shoes that: Do not have high heels. Have rubber bottoms. Are comfortable and fit you well. Are closed at the toe. Do not wear sandals. If you use a stepladder: Make sure that it is fully opened. Do not climb a closed stepladder. Make sure that both sides of the stepladder are locked into place. Ask someone to hold it for you, if possible. Clearly mark and make sure that you can see: Any grab bars or handrails. First and last steps. Where the edge of each step is. Use tools that help you  move around (mobility aids) if they are needed. These include: Canes. Walkers. Scooters. Crutches. Turn on the lights when you go into a dark area. Replace any light bulbs as soon as they burn out. Set up your furniture so you have a clear path. Avoid moving your furniture around. If any of your floors are uneven, fix them. If there are any pets around you, be aware of where they are. Review your medicines with your doctor. Some medicines can make you feel dizzy. This can increase your chance of falling. Ask your doctor what other things that you can do to help prevent falls. This information is not  intended to replace advice given to you by your health care provider. Make sure you discuss any questions you have with your health care provider. Document Released: 01/20/2009 Document Revised: 09/01/2015 Document Reviewed: 04/30/2014 Elsevier Interactive Patient Education  2017 [employer].

## 2024-05-02 LAB — HM DIABETES EYE EXAM

## 2024-05-14 ENCOUNTER — Other Ambulatory Visit: Payer: Self-pay | Admitting: Medical Genetics

## 2024-05-14 DIAGNOSIS — Z006 Encounter for examination for normal comparison and control in clinical research program: Secondary | ICD-10-CM

## 2024-05-28 ENCOUNTER — Ambulatory Visit: Admitting: Urology

## 2024-06-02 ENCOUNTER — Ambulatory Visit: Admitting: Urology
